# Patient Record
Sex: Male | Born: 1945 | Race: White | Hispanic: No | Marital: Married | Smoking: Never smoker
Health system: Southern US, Community
[De-identification: ages and names within clinical notes are randomized; demographics above are authoritative.]

## PROBLEM LIST (undated history)

## (undated) DIAGNOSIS — K5792 Diverticulitis of intestine, part unspecified, without perforation or abscess without bleeding: Secondary | ICD-10-CM

## (undated) DIAGNOSIS — I10 Essential (primary) hypertension: Secondary | ICD-10-CM

## (undated) DIAGNOSIS — M109 Gout, unspecified: Secondary | ICD-10-CM

## (undated) DIAGNOSIS — E785 Hyperlipidemia, unspecified: Secondary | ICD-10-CM

## (undated) DIAGNOSIS — G629 Polyneuropathy, unspecified: Secondary | ICD-10-CM

## (undated) DIAGNOSIS — Z77098 Contact with and (suspected) exposure to other hazardous, chiefly nonmedicinal, chemicals: Secondary | ICD-10-CM

## (undated) DIAGNOSIS — N3281 Overactive bladder: Secondary | ICD-10-CM

## (undated) DIAGNOSIS — G43909 Migraine, unspecified, not intractable, without status migrainosus: Secondary | ICD-10-CM

## (undated) DIAGNOSIS — K219 Gastro-esophageal reflux disease without esophagitis: Secondary | ICD-10-CM

## (undated) DIAGNOSIS — R7303 Prediabetes: Secondary | ICD-10-CM

## (undated) DIAGNOSIS — F419 Anxiety disorder, unspecified: Secondary | ICD-10-CM

## (undated) DIAGNOSIS — H269 Unspecified cataract: Secondary | ICD-10-CM

## (undated) DIAGNOSIS — G5600 Carpal tunnel syndrome, unspecified upper limb: Secondary | ICD-10-CM

## (undated) HISTORY — DX: Prediabetes: R73.03

## (undated) HISTORY — PX: MELANOMA EXCISION: SHX5266

## (undated) HISTORY — PX: OTHER SURGICAL HISTORY: SHX169

## (undated) HISTORY — DX: Essential (primary) hypertension: I10

## (undated) HISTORY — DX: Overactive bladder: N32.81

## (undated) HISTORY — DX: Gout, unspecified: M10.9

## (undated) HISTORY — DX: Carpal tunnel syndrome, unspecified upper limb: G56.00

## (undated) HISTORY — DX: Diverticulitis of intestine, part unspecified, without perforation or abscess without bleeding: K57.92

## (undated) HISTORY — DX: Unspecified cataract: H26.9

## (undated) HISTORY — DX: Migraine, unspecified, not intractable, without status migrainosus: G43.909

## (undated) HISTORY — DX: Anxiety disorder, unspecified: F41.9

## (undated) HISTORY — DX: Hyperlipidemia, unspecified: E78.5

## (undated) HISTORY — DX: Contact with and (suspected) exposure to other hazardous, chiefly nonmedicinal, chemicals: Z77.098

## (undated) HISTORY — DX: Gastro-esophageal reflux disease without esophagitis: K21.9

---

## 2001-07-10 ENCOUNTER — Other Ambulatory Visit: Admission: RE | Admit: 2001-07-10 | Discharge: 2001-07-10 | Payer: Self-pay | Admitting: Internal Medicine

## 2013-04-09 ENCOUNTER — Ambulatory Visit (INDEPENDENT_AMBULATORY_CARE_PROVIDER_SITE_OTHER): Payer: Medicare Other | Admitting: Family Medicine

## 2013-04-09 ENCOUNTER — Encounter: Payer: Self-pay | Admitting: Family Medicine

## 2013-04-09 VITALS — BP 146/78 | HR 60 | Temp 97.5°F | Ht 68.0 in | Wt 183.0 lb

## 2013-04-09 DIAGNOSIS — R739 Hyperglycemia, unspecified: Secondary | ICD-10-CM

## 2013-04-09 DIAGNOSIS — F411 Generalized anxiety disorder: Secondary | ICD-10-CM

## 2013-04-09 DIAGNOSIS — R7303 Prediabetes: Secondary | ICD-10-CM | POA: Insufficient documentation

## 2013-04-09 DIAGNOSIS — E785 Hyperlipidemia, unspecified: Secondary | ICD-10-CM | POA: Insufficient documentation

## 2013-04-09 DIAGNOSIS — M109 Gout, unspecified: Secondary | ICD-10-CM | POA: Insufficient documentation

## 2013-04-09 DIAGNOSIS — N529 Male erectile dysfunction, unspecified: Secondary | ICD-10-CM

## 2013-04-09 DIAGNOSIS — N318 Other neuromuscular dysfunction of bladder: Secondary | ICD-10-CM

## 2013-04-09 DIAGNOSIS — N3281 Overactive bladder: Secondary | ICD-10-CM

## 2013-04-09 DIAGNOSIS — R7309 Other abnormal glucose: Secondary | ICD-10-CM

## 2013-04-09 DIAGNOSIS — I10 Essential (primary) hypertension: Secondary | ICD-10-CM | POA: Insufficient documentation

## 2013-04-09 LAB — COMPLETE METABOLIC PANEL WITH GFR
ALT: 21 U/L (ref 0–53)
AST: 20 U/L (ref 0–37)
Albumin: 5 g/dL (ref 3.5–5.2)
Alkaline Phosphatase: 109 U/L (ref 39–117)
BUN: 16 mg/dL (ref 6–23)
CO2: 28 mEq/L (ref 19–32)
Calcium: 9.6 mg/dL (ref 8.4–10.5)
Chloride: 100 mEq/L (ref 96–112)
Creat: 1.21 mg/dL (ref 0.50–1.35)
GFR, Est African American: 72 mL/min
GFR, Est Non African American: 62 mL/min
Glucose, Bld: 115 mg/dL — ABNORMAL HIGH (ref 70–99)
Potassium: 4.4 mEq/L (ref 3.5–5.3)
Sodium: 139 mEq/L (ref 135–145)
Total Bilirubin: 1.2 mg/dL (ref 0.3–1.2)
Total Protein: 7.2 g/dL (ref 6.0–8.3)

## 2013-04-09 LAB — POCT GLYCOSYLATED HEMOGLOBIN (HGB A1C): Hemoglobin A1C: 5.7

## 2013-04-09 LAB — URIC ACID: Uric Acid, Serum: 6.7 mg/dL — ABNORMAL HIGH (ref 4.0–6.0)

## 2013-04-09 LAB — TSH: TSH: 1.861 u[IU]/mL (ref 0.350–4.500)

## 2013-04-09 MED ORDER — SILDENAFIL CITRATE 100 MG PO TABS: 100.0000 mg | ORAL_TABLET | Freq: Every day | ORAL | Status: DC | PRN

## 2013-04-09 MED ORDER — ALLOPURINOL 300 MG PO TABS: 300.0000 mg | ORAL_TABLET | Freq: Every day | ORAL | Status: DC

## 2013-04-09 MED ORDER — ATENOLOL 100 MG PO TABS: 100.0000 mg | ORAL_TABLET | Freq: Every day | ORAL | Status: DC

## 2013-04-09 MED ORDER — CITALOPRAM HYDROBROMIDE 20 MG PO TABS: 20.0000 mg | ORAL_TABLET | Freq: Every day | ORAL | Status: DC

## 2013-04-09 MED ORDER — SOLIFENACIN SUCCINATE 10 MG PO TABS: 10.0000 mg | ORAL_TABLET | Freq: Every day | ORAL | Status: DC

## 2013-04-09 MED ORDER — CLONAZEPAM 1 MG PO TABS: 1.0000 mg | ORAL_TABLET | Freq: Two times a day (BID) | ORAL | Status: DC | PRN

## 2013-04-09 NOTE — Progress Notes (Signed)
Patient ID: Jonathan Love, male   DOB: [birth year], 67 y.o.   MRN: [id number] SUBJECTIVE: CC: Chief Complaint  Patient presents with  . Follow-up    f/u chronic problems  fasting  ?cpe  . Medication Refill    refill all meds except atorvastain    HPI: Patient is here for follow up of hyperlipidemia/hypertension/hyperglycemia / anxiety disorder.: Doing very well. Stable BPs patient brought in are in normal ranges.systolic 130 or less and diastolic  80 or less. denies Headache;denies Chest Pain;denies weakness;denies Shortness of Breath and orthopnea;denies Visual changes;denies palpitations;denies cough;denies pedal edema;denies symptoms of TIA or stroke;deniesClaudication symptoms. admits to Compliance with medications; denies Problems with medications.  One episode of gout resolved now.   PMH/PSH: reviewed/updated in Epic  SH/FH: reviewed/updated in Epic  Allergies: reviewed/updated in Epic  Medications: reviewed/updated in Epic  Immunizations: reviewed/updated in Epic  ROS: As above in the HPI. All other systems are stable or negative.  OBJECTIVE: APPEARANCE:  Patient in no acute distress.The patient appeared well nourished and normally developed. Acyanotic. Waist: VITAL SIGNS:BP 146/78  Pulse 60  Temp(Src) 97.5 F (36.4 C) (Oral)  Ht 5\' 8"  (1.727 m)  Wt 183 lb (83.008 kg)  BMI 27.83 kg/m2 WM a bit hyperactive in the room. Has gardening to go do. NAD  SKIN: warm and  Dry without overt rashes, tattoos and scars  HEAD and Neck: without JVD, Head and scalp: normal Eyes:No scleral icterus. Fundi normal, eye movements normal. Ears: Auricle normal, canal normal, Tympanic membranes normal, insufflation normal. Nose: normal Throat: normal Neck & thyroid: normal  CHEST & LUNGS: Chest wall: normal Lungs: Clear  CVS: Reveals the PMI to be normally located. Regular rhythm, First and Second Heart sounds are normal,  absence of murmurs, rubs or gallops. Peripheral  vasculature: Radial pulses: normal Dorsal pedis pulses: normal Posterior pulses: normal  ABDOMEN:  Appearance: normal Benign, no organomegaly, no masses, no Abdominal Aortic enlargement. No Guarding , no rebound. No Bruits. Bowel sounds: normal  RECTAL: N/A GU: N/A  EXTREMETIES: nonedematous. Both Femoral and Pedal pulses are normal.  MUSCULOSKELETAL:  Spine: normal Joints: intact  NEUROLOGIC: oriented to time,place and person; nonfocal. Strength is normal Sensory is normal Reflexes are normal Cranial Nerves are normal.  ASSESSMENT: Gout - Plan: allopurinol (ZYLOPRIM) 300 MG tablet, Uric acid  HTN (hypertension) - Plan: atenolol (TENORMIN) 100 MG tablet, COMPLETE METABOLIC PANEL WITH GFR  HLD (hyperlipidemia) - Plan: COMPLETE METABOLIC PANEL WITH GFR, NMR Lipoprofile with Lipids  Prediabetes  Anxiety state, unspecified - Plan: clonazePAM (KLONOPIN) 1 MG tablet, citalopram (CELEXA) 20 MG tablet  OAB (overactive bladder) - Plan: solifenacin (VESICARE) 10 MG tablet  Erectile dysfunction - Plan: sildenafil (VIAGRA) 100 MG tablet, TSH  Hyperglycemia - Plan: POCT glycosylated hemoglobin (Hb A1C), COMPLETE METABOLIC PANEL WITH GFR   PLAN: Orders Placed This Encounter  Procedures  . COMPLETE METABOLIC PANEL WITH GFR  . NMR Lipoprofile with Lipids  . Uric acid  . TSH  . POCT glycosylated hemoglobin (Hb A1C)   Meds ordered this encounter  Medications  . DISCONTD: allopurinol (ZYLOPRIM) 300 MG tablet    Sig: Take 300 mg by mouth daily.   Marland Kitchen DISCONTD: atenolol (TENORMIN) 100 MG tablet    Sig: Take 100 mg by mouth daily.   Marland Kitchen atorvastatin (LIPITOR) 40 MG tablet    Sig: Take 40 mg by mouth daily.   Marland Kitchen DISCONTD: clonazePAM (KLONOPIN) 1 MG tablet    Sig: 1 mg 2 (two) times daily as needed.   Marland Kitchen  DISCONTD: VESICARE 10 MG tablet    Sig: Take 10 mg by mouth daily.   Marland Kitchen DISCONTD: sildenafil (VIAGRA) 100 MG tablet    Sig: Take 100 mg by mouth daily as needed for erectile  dysfunction.  . solifenacin (VESICARE) 10 MG tablet    Sig: Take 1 tablet (10 mg total) by mouth daily.    Dispense:  30 tablet    Refill:  11  . clonazePAM (KLONOPIN) 1 MG tablet    Sig: Take 1 tablet (1 mg total) by mouth 2 (two) times daily as needed.    Dispense:  60 tablet    Refill:  1  . citalopram (CELEXA) 20 MG tablet    Sig: Take 1 tablet (20 mg total) by mouth daily.    Dispense:  30 tablet    Refill:  5  . allopurinol (ZYLOPRIM) 300 MG tablet    Sig: Take 1 tablet (300 mg total) by mouth daily.    Dispense:  30 tablet    Refill:  11  . atenolol (TENORMIN) 100 MG tablet    Sig: Take 1 tablet (100 mg total) by mouth daily.    Dispense:  30 tablet    Refill:  5  . sildenafil (VIAGRA) 100 MG tablet    Sig: Take 1 tablet (100 mg total) by mouth daily as needed for erectile dysfunction.    Dispense:  10 tablet    Refill:  11        Dr Woodroe Mode Recommendations  Diet and Exercise discussed with patient.  For nutrition information, I recommend books:  1).Eat to Live by Dr Monico Hoar. 2).Prevent and Reverse Heart Disease by Dr Suzzette Righter. 3) Dr Katherina Right Book:  Program to Reverse Diabetes  Exercise recommendations are:  If unable to walk, then the patient can exercise in a chair 3 times a day. By flapping arms like a bird gently and raising legs outwards to the front.  If ambulatory, the patient can go for walks for 30 minutes 3 times a week. Then increase the intensity and duration as tolerated.  Goal is to try to attain exercise frequency to 5 times a week.  If applicable: Best to perform resistance exercises (machines or weights) 2 days a week and cardio type exercises 3 days per week. Handout onDASH Diet in the AVS.  Return in about 4 months (around 08/09/2013) for Physical, .   Carlise Stofer P. [place] Charon, M.D.

## 2013-04-09 NOTE — Patient Instructions (Signed)
    Dr Irisha Grandmaison's Recommendations  Diet and Exercise discussed with patient.  For nutrition information, I recommend books:  1).Eat to Live by Dr Joel Fuhrman. 2).Prevent and Reverse Heart Disease by Dr Caldwell Esselstyn. 3) Dr Neal Barnard's Book:  Program to Reverse Diabetes  Exercise recommendations are:  If unable to walk, then the patient can exercise in a chair 3 times a day. By flapping arms like a bird gently and raising legs outwards to the front.  If ambulatory, the patient can go for walks for 30 minutes 3 times a week. Then increase the intensity and duration as tolerated.  Goal is to try to attain exercise frequency to 5 times a week.  If applicable: Best to perform resistance exercises (machines or weights) 2 days a week and cardio type exercises 3 days per week.    DASH Diet The DASH diet stands for "Dietary Approaches to Stop Hypertension." It is a healthy eating plan that has been shown to reduce high blood pressure (hypertension) in as little as 14 days, while also possibly providing other significant health benefits. These other health benefits include reducing the risk of breast cancer after menopause and reducing the risk of type 2 diabetes, heart disease, colon cancer, and stroke. Health benefits also include weight loss and slowing kidney failure in patients with chronic kidney disease.  DIET GUIDELINES  Limit salt (sodium). Your diet should contain less than 1500 mg of sodium daily.  Limit refined or processed carbohydrates. Your diet should include mostly whole grains. Desserts and added sugars should be used sparingly.  Include small amounts of heart-healthy fats. These types of fats include nuts, oils, and tub margarine. Limit saturated and trans fats. These fats have been shown to be harmful in the body. CHOOSING FOODS  The following food groups are based on a 2000 calorie diet. See your Registered Dietitian for individual calorie needs. Grains and  Grain Products (6 to 8 servings daily)  Eat More Often: Whole-wheat bread, brown rice, whole-grain or wheat pasta, quinoa, popcorn without added fat or salt (air popped).  Eat Less Often: White bread, white pasta, white rice, cornbread. Vegetables (4 to 5 servings daily)  Eat More Often: Fresh, frozen, and canned vegetables. Vegetables may be raw, steamed, roasted, or grilled with a minimal amount of fat.  Eat Less Often/Avoid: Creamed or fried vegetables. Vegetables in a cheese sauce. Fruit (4 to 5 servings daily)  Eat More Often: All fresh, canned (in natural juice), or frozen fruits. Dried fruits without added sugar. One hundred percent fruit juice ( cup [237 mL] daily).  Eat Less Often: Dried fruits with added sugar. Canned fruit in light or heavy syrup. Lean Meats, Fish, and Poultry (2 servings or less daily. One serving is 3 to 4 oz [85-114 g]).  Eat More Often: Ninety percent or leaner ground beef, tenderloin, sirloin. Round cuts of beef, chicken breast, turkey breast. All fish. Grill, bake, or broil your meat. Nothing should be fried.  Eat Less Often/Avoid: Fatty cuts of meat, turkey, or chicken leg, thigh, or wing. Fried cuts of meat or fish. Dairy (2 to 3 servings)  Eat More Often: Low-fat or fat-free milk, low-fat plain or light yogurt, reduced-fat or part-skim cheese.  Eat Less Often/Avoid: Milk (whole, 2%).Whole milk yogurt. Full-fat cheeses. Nuts, Seeds, and Legumes (4 to 5 servings per week)  Eat More Often: All without added salt.  Eat Less Often/Avoid: Salted nuts and seeds, canned beans with added salt. Fats and Sweets (  limited)  Eat More Often: Vegetable oils, tub margarines without trans fats, sugar-free gelatin. Mayonnaise and salad dressings.  Eat Less Often/Avoid: Coconut oils, palm oils, butter, stick margarine, cream, half and half, cookies, candy, pie. FOR MORE INFORMATION The Dash Diet Eating Plan: www.dashdiet.org Document Released: 10/05/2011  Document Revised: 01/08/2012 Document Reviewed: 10/05/2011 [employer] Patient Information 2014 [employer].  

## 2013-04-10 LAB — NMR LIPOPROFILE WITH LIPIDS
Cholesterol, Total: 138 mg/dL (ref <200)
HDL Particle Number: 35.1 umol/L (ref >=30.5)
HDL Size: 8.7 nm — ABNORMAL LOW (ref >=9.2)
HDL-C: 47 mg/dL (ref >=40)
LDL (calc): 68 mg/dL (ref <100)
LDL Particle Number: 847 nmol/L (ref <1000)
LDL Size: 20.6 nm (ref >20.5)
LP-IR Score: 55 — ABNORMAL HIGH (ref <=45)
Large HDL-P: 3.6 umol/L — ABNORMAL LOW (ref >=4.8)
Large VLDL-P: 1.8 nmol/L (ref <=2.7)
Small LDL Particle Number: 395 nmol/L (ref <=527)
Triglycerides: 117 mg/dL (ref <150)
VLDL Size: 43.6 nm (ref <=46.6)

## 2013-04-11 NOTE — Progress Notes (Signed)
Quick Note:  Lab result at goal. prediabetes No change in Medications for now. No Change in plans and follow up. ______

## 2013-06-04 ENCOUNTER — Other Ambulatory Visit: Payer: Self-pay

## 2013-06-12 ENCOUNTER — Other Ambulatory Visit: Payer: Self-pay | Admitting: Family Medicine

## 2013-06-16 NOTE — Telephone Encounter (Signed)
fpw please advise

## 2013-06-16 NOTE — Telephone Encounter (Signed)
Okay to refill x1 

## 2013-06-16 NOTE — Telephone Encounter (Signed)
Last seen 04/09/13   [facility]   If approved call in and route to nurse

## 2013-06-17 NOTE — Telephone Encounter (Signed)
Called in.

## 2013-06-17 NOTE — Telephone Encounter (Signed)
Called in by [employer]

## 2013-07-14 ENCOUNTER — Encounter: Payer: Self-pay | Admitting: *Deleted

## 2013-07-14 ENCOUNTER — Other Ambulatory Visit: Payer: Self-pay | Admitting: Family Medicine

## 2013-07-15 NOTE — Telephone Encounter (Signed)
Last seen 04/09/13, last filled 06/12/13. If approved route to pool A so it can be called into [facility]

## 2013-07-17 NOTE — Telephone Encounter (Signed)
RX FOR CLONAZPEPAM CALLED TO [facility] VOICE MAIL

## 2013-07-17 NOTE — Telephone Encounter (Signed)
Rx ready for Phone in. 

## 2013-08-12 ENCOUNTER — Ambulatory Visit (INDEPENDENT_AMBULATORY_CARE_PROVIDER_SITE_OTHER): Payer: Medicare Other | Admitting: Family Medicine

## 2013-08-12 ENCOUNTER — Encounter: Payer: Self-pay | Admitting: Family Medicine

## 2013-08-12 VITALS — BP 135/78 | HR 77 | Temp 97.8°F | Ht 68.0 in | Wt 185.2 lb

## 2013-08-12 DIAGNOSIS — F411 Generalized anxiety disorder: Secondary | ICD-10-CM

## 2013-08-12 DIAGNOSIS — H6092 Unspecified otitis externa, left ear: Secondary | ICD-10-CM

## 2013-08-12 DIAGNOSIS — Z0000 Encounter for general adult medical examination without abnormal findings: Secondary | ICD-10-CM

## 2013-08-12 DIAGNOSIS — I10 Essential (primary) hypertension: Secondary | ICD-10-CM

## 2013-08-12 DIAGNOSIS — M109 Gout, unspecified: Secondary | ICD-10-CM

## 2013-08-12 DIAGNOSIS — N318 Other neuromuscular dysfunction of bladder: Secondary | ICD-10-CM

## 2013-08-12 DIAGNOSIS — Z7739 Contact with and (suspected) exposure to other war theater: Secondary | ICD-10-CM

## 2013-08-12 DIAGNOSIS — R7303 Prediabetes: Secondary | ICD-10-CM

## 2013-08-12 DIAGNOSIS — R7309 Other abnormal glucose: Secondary | ICD-10-CM

## 2013-08-12 DIAGNOSIS — N3281 Overactive bladder: Secondary | ICD-10-CM | POA: Insufficient documentation

## 2013-08-12 DIAGNOSIS — G5600 Carpal tunnel syndrome, unspecified upper limb: Secondary | ICD-10-CM

## 2013-08-12 DIAGNOSIS — E785 Hyperlipidemia, unspecified: Secondary | ICD-10-CM

## 2013-08-12 DIAGNOSIS — Z119 Encounter for screening for infectious and parasitic diseases, unspecified: Secondary | ICD-10-CM

## 2013-08-12 DIAGNOSIS — G43909 Migraine, unspecified, not intractable, without status migrainosus: Secondary | ICD-10-CM | POA: Insufficient documentation

## 2013-08-12 DIAGNOSIS — Z77098 Contact with and (suspected) exposure to other hazardous, chiefly nonmedicinal, chemicals: Secondary | ICD-10-CM | POA: Insufficient documentation

## 2013-08-12 DIAGNOSIS — G5602 Carpal tunnel syndrome, left upper limb: Secondary | ICD-10-CM

## 2013-08-12 DIAGNOSIS — H6090 Unspecified otitis externa, unspecified ear: Secondary | ICD-10-CM | POA: Insufficient documentation

## 2013-08-12 DIAGNOSIS — Z23 Encounter for immunization: Secondary | ICD-10-CM

## 2013-08-12 DIAGNOSIS — H60399 Other infective otitis externa, unspecified ear: Secondary | ICD-10-CM

## 2013-08-12 DIAGNOSIS — K219 Gastro-esophageal reflux disease without esophagitis: Secondary | ICD-10-CM

## 2013-08-12 DIAGNOSIS — F419 Anxiety disorder, unspecified: Secondary | ICD-10-CM | POA: Insufficient documentation

## 2013-08-12 MED ORDER — ATENOLOL 100 MG PO TABS: 100.0000 mg | ORAL_TABLET | Freq: Every day | ORAL | Status: DC

## 2013-08-12 MED ORDER — NEOMYCIN-POLYMYXIN-HC 1 % OT SOLN: 3.0000 [drp] | Freq: Four times a day (QID) | OTIC | Status: DC

## 2013-08-12 MED ORDER — ATORVASTATIN CALCIUM 40 MG PO TABS: 40.0000 mg | ORAL_TABLET | Freq: Every day | ORAL | Status: DC

## 2013-08-12 MED ORDER — CLONAZEPAM 1 MG PO TABS: ORAL_TABLET | ORAL | Status: DC

## 2013-08-12 MED ORDER — BENAZEPRIL HCL 10 MG PO TABS: 10.0000 mg | ORAL_TABLET | Freq: Every day | ORAL | Status: DC

## 2013-08-12 MED ORDER — SERTRALINE HCL 100 MG PO TABS: 100.0000 mg | ORAL_TABLET | Freq: Every day | ORAL | Status: DC

## 2013-08-12 MED ORDER — OXYBUTYNIN CHLORIDE 5 MG PO TABS: 5.0000 mg | ORAL_TABLET | Freq: Two times a day (BID) | ORAL | Status: DC

## 2013-08-12 NOTE — Progress Notes (Signed)
Patient ID: Jonathan Love, male   DOB: 1945-12-01, 67 y.o.   MRN: [id number] SUBJECTIVE: CC: Chief Complaint  Patient presents with  . Annual Exam    needs refills     HPI: Here for his annual physical Other issues: Wants to switch from vesicare to oxybutynin Had side effects of citalopram. It was helping. He wants to try something for his anxiety.  Past Medical History  Diagnosis Date  . Hypertension   . GERD (gastroesophageal reflux disease)   . Prediabetes   . Hyperlipidemia   . Gout   . Migraine   . CTS (carpal tunnel syndrome)     left  . 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 OAB (overactive bladder)   . Anxiety    Past Surgical History  Procedure Laterality Date  . Vocal cord nodule    . Right lateral epicondylitis     History   Social History  . Marital Status: Married    Spouse Name: N/A    Number of Children: N/A  . Years of Education: N/A   Occupational History  . Not on file.   Social History Main Topics  . Smoking status: Never Smoker   . Smokeless tobacco: Not on file  . Alcohol Use: Not on file  . Drug Use: Not on file  . Sexual Activity: Not on file   Other Topics Concern  . Not on file   Social History Narrative  . No narrative on file   No family history on file. Current Outpatient Prescriptions on File Prior to Visit  Medication Sig Dispense Refill  . allopurinol (ZYLOPRIM) 300 MG tablet Take 1 tablet (300 mg total) by mouth daily.  30 tablet  11  . sildenafil (VIAGRA) 100 MG tablet Take 1 tablet (100 mg total) by mouth daily as needed for erectile dysfunction.  10 tablet  11   No current facility-administered medications on file prior to visit.   Allergies  Allergen Reactions  . Penicillins    Immunization History  Administered Date(s) Administered  . Influenza Split 07/14/2013  . Tdap 08/12/2013   Prior to Admission medications   Medication Sig Start Date End Date Taking? Authorizing Provider  allopurinol (ZYLOPRIM) 300 MG tablet  Take 1 tablet (300 mg total) by mouth daily. 04/09/13  Yes Ileana Ladd, MD  atenolol (TENORMIN) 100 MG tablet Take 1 tablet (100 mg total) by mouth daily. 04/09/13  Yes Ileana Ladd, MD  atorvastatin (LIPITOR) 40 MG tablet Take 40 mg by mouth daily.  03/18/13  Yes Historical Provider, MD  benazepril (LOTENSIN) 10 MG tablet  05/08/13  Yes Historical Provider, MD  clonazePAM (KLONOPIN) 1 MG tablet TAKE ONE TABLET BY MOUTH TWICE DAILY AS NEEDED 07/14/13  Yes Ileana Ladd, MD  sildenafil (VIAGRA) 100 MG tablet Take 1 tablet (100 mg total) by mouth daily as needed for erectile dysfunction. 04/09/13  Yes Ileana Ladd, MD  solifenacin (VESICARE) 10 MG tablet Take 1 tablet (10 mg total) by mouth daily. 04/09/13  Yes Ileana Ladd, MD     ROS: As above in the HPI. All other systems are stable or negative.  OBJECTIVE: APPEARANCE:  Patient in no acute distress.The patient appeared well nourished and normally developed. Acyanotic. Waist: VITAL SIGNS:BP 135/78  Pulse 77  Temp(Src) 97.8 F (36.6 C) (Oral)  Ht 5\' 8"  (1.727 m)  Wt 185 lb 3.2 oz (84.006 kg)  BMI 28.17 kg/m2  WM  SKIN: warm and  Dry without  overt rashes, tattoos and scars  HEAD and Neck: without JVD, Head and scalp: normal Eyes:No scleral icterus. Fundi normal, eye movements normal. Ears: Auricle normal, canal left has watery white wax, and the  External canal is red and inflamed. Tympanic membranes normal, insufflation normal. Nose: normal Throat: normal Neck & thyroid: normal  CHEST & LUNGS: Chest wall: normal Lungs: Clear  CVS: Reveals the PMI to be normally located. Regular rhythm, First and Second Heart sounds are normal,  absence of murmurs, rubs or gallops. Peripheral vasculature: Radial pulses: normal Dorsal pedis pulses: normal Posterior pulses: normal  ABDOMEN:  Appearance: normal Benign, no organomegaly, no masses, no Abdominal Aortic enlargement. No Guarding , no rebound. No Bruits. Bowel sounds:  normal  RECTAL: N/A GU: N/A  EXTREMETIES: nonedematous.  MUSCULOSKELETAL:  Spine: normal Joints: intact  NEUROLOGIC: oriented to time,place and person; nonfocal. Strength is normal Sensory is normal Reflexes are normal Cranial Nerves are normal.  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  CTS (carpal tunnel syndrome), left  Migraine  Gout - Plan: Uric acid  GERD (gastroesophageal reflux disease)  Prediabetes  HTN (hypertension) - Plan: CMP14+EGFR, benazepril (LOTENSIN) 10 MG tablet, atenolol (TENORMIN) 100 MG tablet  HLD (hyperlipidemia) - Plan: CMP14+EGFR, NMR, lipoprofile, atorvastatin (LIPITOR) 40 MG tablet  OE (otitis externa), left  OAB (overactive bladder) - Plan: oxybutynin (DITROPAN) 5 MG tablet  Anxiety - Plan: sertraline (ZOLOFT) 100 MG tablet, clonazePAM (KLONOPIN) 1 MG tablet  Screening examination for infectious disease - Plan: Hepatitis C antibody  PLAN:  Orders Placed This Encounter  Procedures  . Tdap vaccine greater than or equal to 7yo IM  . CMP14+EGFR  . NMR, lipoprofile  . Uric acid  . Hepatitis C antibody   Meds ordered this encounter  Medications  . DISCONTD: benazepril (LOTENSIN) 10 MG tablet    Sig:   . DISCONTD: FLUVIRIN PRESERVATIVE FREE SUSP injection    Sig:   . sertraline (ZOLOFT) 100 MG tablet    Sig: Take 1 tablet (100 mg total) by mouth daily. ( start with 1/2 tablet daily for 1 week.)    Dispense:  30 tablet    Refill:  5  . oxybutynin (DITROPAN) 5 MG tablet    Sig: Take 1 tablet (5 mg total) by mouth 2 (two) times daily.    Dispense:  60 tablet    Refill:  5  . clonazePAM (KLONOPIN) 1 MG tablet    Sig: TAKE ONE TABLET BY MOUTH TWICE DAILY AS NEEDED    Dispense:  60 tablet    Refill:  2  . atorvastatin (LIPITOR) 40 MG tablet    Sig: Take 1 tablet (40 mg total) by  mouth daily.    Dispense:  30 tablet    Refill:  5  . benazepril (LOTENSIN) 10 MG tablet    Sig: Take 1 tablet (10 mg total) by mouth daily.    Dispense:  30 tablet    Refill:  5  . atenolol (TENORMIN) 100 MG tablet    Sig: Take 1 tablet (100 mg total) by mouth daily.    Dispense:  30 tablet    Refill:  5   Medications Discontinued During This Encounter  Medication Reason  . citalopram (CELEXA) 20 MG tablet Patient has not taken in last 30 days  . FLUVIRIN PRESERVATIVE FREE SUSP injection Completed Course  .  solifenacin (VESICARE) 10 MG tablet Cost of medication  . clonazePAM (KLONOPIN) 1 MG tablet Reorder  . atorvastatin (LIPITOR) 40 MG tablet Reorder  . benazepril (LOTENSIN) 10 MG tablet Reorder  . atenolol (TENORMIN) 100 MG tablet Reorder         HEALTH MAINTENANCE Immunizations: Tetanus-Diphtheria Booster ZOX:[id number] Pertusis Booster [id number]:[id number] Flu Shot Due: every Fall Pneumonia Vaccine: usually at 66 years of age unless there are certain risk situations. Herpes Zoster/Shingles Vaccine due: usually at 66 years of age HPV JYN:WGNF age 42 to 20 years in males and females.  Healthy Life Habits: Exercise Goal: 5-6 days/week; start gradually(ie 30 minutes/3days per week) Nutrition: Balanced healthy meals including Vegetables and Fruits. Consider  Reading the following books: 1) Eat to Live by Dr Ottis Stain; 2) Prevent and Reverse Heart Disease by Dr Suzzette Righter.  Vitamins:Okay for a Multivitamin Aspirin:81 mg Stop Tobacco Use:n/a Seat Belt Use:+++ recommended Sunscreen Use:+++ recommended   Recommended Screening Tests: Colon Cancer Screening:past due Blood work:today Cholesterol Screening:  today        HIV:       ?           Hepatitis C(people born 1945-1965):today    Monthly Self Testicular Exam:+++  Eye Exam: every 1 to 2 years recommended Dental Health: at least every 6 months  Others:    Living Will/Healthcare Power of Attorney: should have this in  order with your personal estate planning  He will discuss with his wife who to be referred to for colonoscopy and get back with [place]. Return in about 3 months (around 11/12/2013) for Recheck medical problems.  Annica Marinello P. [place] Charon, M.D.

## 2013-08-12 NOTE — Patient Instructions (Addendum)
Tetanus, Diphtheria (Td) or Tetanus, Diphtheria, Pertussis (Tdap) Vaccine What You Need to Know WHY GET VACCINATED? Tetanus, diphtheria and pertussis can be very serious diseases. TETANUS (Lockjaw) causes painful muscle spasms and stiffness, usually all over the body.  Tetanus can lead to tightening of muscles in the head and neck so the victim cannot open his mouth or swallow, or sometimes even breathe. Tetanus kills about 1 out of 5 people who are infected. DIPHTHERIA can cause a thick membrane to cover the back of the throat.  Diphtheria can lead to breathing problems, paralysis, heart failure, and even death. PERTUSSIS (Whooping Cough) causes severe coughing spells which can lead to difficulty breathing, vomiting, and disturbed sleep.  Pertussis can lead to weight loss, incontinence, rib fractures and passing out from violent coughing. Up to 2 in 100 adolescents and 5 in 100 adults with pertussis are hospitalized or have complications, including pneumonia and death. These 3 diseases are all caused by bacteria. Diphtheria and pertussis are spread from person to person. Tetanus enters the body through cuts, scratches, or wounds. The [place] saw as many as 200,000 cases a year of diphtheria and pertussis before vaccines were available, and hundreds of cases of tetanus. Since then, tetanus and diphtheria cases have dropped by about 99% and pertussis cases by about 92%. Children 6 years of age and younger get DTaP vaccine to protect them from these three diseases. But older children, adolescents, and adults need protection too. VACCINES FOR ADOLESCENTS AND ADULTS: TD AND TDAP Two vaccines are available to protect people 7 years of age and older from these diseases:  Td vaccine has been used for many years. It protects against tetanus and diphtheria.  Tdap vaccine was licensed in 2005. It is the first vaccine for adolescents and adults that protects against pertussis as well as tetanus and  diphtheria. A Td booster dose is recommended every 10 years. Tdap is given only once. WHICH VACCINE, AND WHEN? Ages 7 through 18 years  A dose of Tdap is recommended at age 11 or 12. This dose could be given as early as age 7 for children who missed one or more childhood doses of DTaP.  Children and adolescents who did not get a complete series of DTaP shots by age 7 should complete the series using a combination of Td and Tdap. Age 19 years and Older  All adults should get a booster dose of Td every 10 years. Adults under 65 who have never gotten Tdap should get a dose of Tdap as their next booster dose. Adults 65 and older may get one booster dose of Tdap.  Adults (including women who may become pregnant and adults 65 and older) who expect to have close contact with a baby younger than 12 months of age should get a dose of Tdap to help protect the baby from pertussis.  Healthcare professionals who have direct patient contact in hospitals or clinics should get one dose of Tdap. Protection After a Wound  A person who gets a severe cut or burn might need a dose of Td or Tdap to prevent tetanus infection. Tdap should be used for anyone who has never had a dose previously. Td should be used if Tdap is not available, or for:  Anybody who has already had a dose of Tdap.  Children 7 through 9 years of age who completed the childhood DTaP series.  Adults 65 and older. Pregnant Women  Pregnant women who have never had a dose of Tdap   should get one, after the 20th week of gestation and preferably during the 3rd trimester. If they do not get Tdap during their pregnancy they should get a dose as soon as possible after delivery. Pregnant women who have previously received Tdap and need tetanus or diphtheria vaccine while pregnant should get Td. Tdap and Td may be given at the same time as other vaccines. SOME PEOPLE SHOULD NOT BE VACCINATED OR SHOULD WAIT  Anyone who has had a life-threatening  allergic reaction after a dose of any tetanus, diphtheria, or pertussis containing vaccine should not get Td or Tdap.  Anyone who has a severe allergy to any component of a vaccine should not get that vaccine. Tell your doctor if the person getting the vaccine has any severe allergies.  Anyone who had a coma, or long or multiple seizures within 7 days after a dose of DTP or DTaP should not get Tdap, unless a cause other than the vaccine was found. These people may get Td.  Talk to your doctor if the person getting either vaccine:  Has epilepsy or another nervous system problem.  Had severe swelling or severe pain after a previous dose of DTP, DTaP, DT, Td, or Tdap vaccine.  Has had Guillain Barr Syndrome (GBS). Anyone who has a moderate or severe illness on the day the shot is scheduled should usually wait until they recover before getting Tdap or Td vaccine. A person with a mild illness or low fever can usually be vaccinated. WHAT ARE THE RISKS FROM TDAP AND TD VACCINES? With a vaccine, as with any medicine, there is always a small risk of a life-threatening allergic reaction or other serious problem. Brief fainting spells and related symptoms (such as jerking movements) can happen after any medical procedure, including vaccination. Sitting or lying down for about 15 minutes after a vaccination can help prevent fainting and injuries caused by falls. Tell your doctor if the patient feels dizzy or lightheaded, or has vision changes or ringing in the ears. Getting tetanus, diphtheria, or pertussis would be much more likely to lead to severe problems than getting either Td or Tdap vaccine. Problems reported after Td and Tdap vaccines are listed below. Mild Problems (noticeable, but did not interfere with activities) Tdap  Pain (about 3 in 4 adolescents and 2 in 3 adults).  Redness or swelling (about 1 in 5).  Mild fever of at least 100.4 F (38 C) (up to about 1 in 25 adolescents and 1 in  100 adults).  Headache (about 4 in 10 adolescents and 3 in 10 adults).  Tiredness (about 1 in 3 adolescents and 1 in 4 adults).  Nausea, vomiting, diarrhea, or stomach ache (up to 1 in 4 adolescents and 1 in 10 adults).  Chills, body aches, sore joints, rash, or swollen glands (uncommon). Td  Pain (up to about 8 in 10).  Redness or swelling at the injection site (up to about 1 in 3).  Mild fever (up to about 1 in 15).  Headache or tiredness (uncommon). Moderate Problems (interfered with activities, but did not require medical attention) Tdap  Pain at the injection site (about 1 in 20 adolescents and 1 in 100 adults).  Redness or swelling at the injection site (up to about 1 in 16 adolescents and 1 in 25 adults).  Fever over 102 F (38.9 C) (about 1 in 100 adolescents and 1 in 250 adults).  Headache (1 in 300).  Nausea, vomiting, diarrhea, or stomach ache (up to 3   in 100 adolescents and 1 in 100 adults). Td  Fever over 102 F (38.9 C) (rare). Tdap or Td  Extensive swelling of the arm where the shot was given (up to about 3 in 100). Severe Problems (Unable to perform usual activities; required medical attention) Tdap or Td  Swelling, severe pain, bleeding, and redness in the arm where the shot was given (rare). A severe allergic reaction could occur after any vaccine. They are estimated to occur less than once in a million doses. WHAT IF THERE IS A SEVERE REACTION? What should I look for? Any unusual condition, such as a severe allergic reaction or a high fever. If a severe allergic reaction occurred, it would be within a few minutes to an hour after the shot. Signs of a serious allergic reaction can include difficulty breathing, weakness, hoarseness or wheezing, a fast heartbeat, hives, dizziness, paleness, or swelling of the throat. What should I do?  Call a doctor, or get the person to a doctor right away.  Tell your doctor what happened, the date and time it  happened, and when the vaccination was given.  Ask your provider to report the reaction by filing a Vaccine Adverse Event Reporting System (VAERS) form. Or, you can file this report through the VAERS website at www.vaers.LAgents.no or by calling 1-(563)019-0606. VAERS does not provide medical advice. THE NATIONAL VACCINE INJURY COMPENSATION PROGRAM The National Vaccine Injury Compensation Program (VICP) was created in 1986. Persons who believe they may have been injured by a vaccine can learn about the program and about filing a claim by calling 1-(386)719-7196 or visiting the VICP website at SpiritualWord.at. HOW CAN I LEARN MORE?  Your doctor can give you the vaccine package insert or suggest other sources of information.  Call your local or state health department.  Contact the Centers for Disease Control and Prevention (CDC):  Call 715-705-1427 (1-800-CDC-INFO).  Visit the [facility] website at PicCapture.uy. CDC Td and Tdap Interim VIS (11/22/10) Document Released: 08/13/2006 Document Revised: 01/08/2012 Document Reviewed: 11/22/2010 ExitCare Patient Information 2014 [place], [place].        HEALTH MAINTENANCE Immunizations: Tetanus-Diphtheria Booster JWJ:XBJYN Pertusis Booster WGN:FAOZH Flu Shot Due: every Fall Pneumonia Vaccine: usually at 66 years of age unless there are certain risk situations. Herpes Zoster/Shingles Vaccine due: usually at 66 years of age HPV YQM:VHQI age 41 to 17 years in males and females.  Healthy Life Habits: Exercise Goal: 5-6 days/week; start gradually(ie 30 minutes/3days per week) Nutrition: Balanced healthy meals including Vegetables and Fruits. Consider  Reading the following books: 1) Eat to Live by Dr Ottis Stain; 2) Prevent and Reverse Heart Disease by Dr Suzzette Righter.  Vitamins:Multivitamin Okay Aspirin:81 mg Stop Tobacco Use:n/a Seat Belt Use:+++ recommended Sunscreen Use:+++ recommended   Recommended Screening  Tests: Colon Cancer Screening:past due Blood work:today Cholesterol Screening:  today        HIV:       ?           Hepatitis C(people born 1945-1965):today    Monthly Self Testicular Exam:+++  Eye Exam: every 1 to 2 years recommended Dental Health: at least every 6 months  Others:    Living Will/Healthcare Power of Attorney: should have this in order with your personal estate planning

## 2013-08-12 NOTE — Progress Notes (Signed)
Tolerated tdap without difficulty 

## 2013-08-13 ENCOUNTER — Encounter: Payer: Medicare Other | Admitting: Family Medicine

## 2013-08-13 LAB — CMP14+EGFR
ALT: 23 IU/L (ref 0–44)
AST: 19 IU/L (ref 0–40)
Albumin/Globulin Ratio: 1.9 (ref 1.1–2.5)
Albumin: 4.8 g/dL (ref 3.6–4.8)
Alkaline Phosphatase: 121 IU/L — ABNORMAL HIGH (ref 39–117)
BUN/Creatinine Ratio: 14 (ref 10–22)
BUN: 16 mg/dL (ref 8–27)
CO2: 27 mmol/L (ref 18–29)
Calcium: 10 mg/dL (ref 8.6–10.2)
Chloride: 97 mmol/L (ref 97–108)
Creatinine, Ser: 1.12 mg/dL (ref 0.76–1.27)
GFR calc Af Amer: 79 mL/min/{1.73_m2} (ref >59)
GFR calc non Af Amer: 68 mL/min/{1.73_m2} (ref >59)
Globulin, Total: 2.5 g/dL (ref 1.5–4.5)
Glucose: 96 mg/dL (ref 65–99)
Potassium: 4.7 mmol/L (ref 3.5–5.2)
Sodium: 140 mmol/L (ref 134–144)
Total Bilirubin: 0.8 mg/dL (ref 0.0–1.2)
Total Protein: 7.3 g/dL (ref 6.0–8.5)

## 2013-08-13 LAB — NMR, LIPOPROFILE
Cholesterol: 156 mg/dL (ref <200)
HDL Cholesterol by NMR: 47 mg/dL (ref >=40)
HDL Particle Number: 38.7 umol/L (ref >=30.5)
LDL Particle Number: 987 nmol/L (ref <1000)
LDL Size: 20 nm — ABNORMAL LOW (ref >20.5)
LDLC SERPL CALC-MCNC: 72 mg/dL (ref <100)
LP-IR Score: 58 — ABNORMAL HIGH (ref <=45)
Small LDL Particle Number: 833 nmol/L — ABNORMAL HIGH (ref <=527)
Triglycerides by NMR: 183 mg/dL — ABNORMAL HIGH (ref <150)

## 2013-08-13 LAB — HEPATITIS C ANTIBODY: Hep C Virus Ab: <0.1 s/co ratio (ref 0.0–0.9)

## 2013-08-13 LAB — URIC ACID: Uric Acid: 7.1 mg/dL (ref 3.7–8.6)

## 2013-11-14 ENCOUNTER — Ambulatory Visit (INDEPENDENT_AMBULATORY_CARE_PROVIDER_SITE_OTHER): Payer: Medicare Other | Admitting: Family Medicine

## 2013-11-14 ENCOUNTER — Encounter: Payer: Self-pay | Admitting: Family Medicine

## 2013-11-14 VITALS — BP 139/72 | HR 64 | Temp 97.9°F | Ht 68.0 in | Wt 184.4 lb

## 2013-11-14 DIAGNOSIS — F419 Anxiety disorder, unspecified: Secondary | ICD-10-CM

## 2013-11-14 DIAGNOSIS — M109 Gout, unspecified: Secondary | ICD-10-CM

## 2013-11-14 DIAGNOSIS — R7303 Prediabetes: Secondary | ICD-10-CM

## 2013-11-14 DIAGNOSIS — N3281 Overactive bladder: Secondary | ICD-10-CM

## 2013-11-14 DIAGNOSIS — K219 Gastro-esophageal reflux disease without esophagitis: Secondary | ICD-10-CM

## 2013-11-14 DIAGNOSIS — E785 Hyperlipidemia, unspecified: Secondary | ICD-10-CM

## 2013-11-14 DIAGNOSIS — R7309 Other abnormal glucose: Secondary | ICD-10-CM

## 2013-11-14 DIAGNOSIS — G43909 Migraine, unspecified, not intractable, without status migrainosus: Secondary | ICD-10-CM

## 2013-11-14 DIAGNOSIS — I10 Essential (primary) hypertension: Secondary | ICD-10-CM

## 2013-11-14 DIAGNOSIS — N318 Other neuromuscular dysfunction of bladder: Secondary | ICD-10-CM

## 2013-11-14 DIAGNOSIS — F411 Generalized anxiety disorder: Secondary | ICD-10-CM

## 2013-11-14 LAB — POCT GLYCOSYLATED HEMOGLOBIN (HGB A1C): Hemoglobin A1C: 5.5

## 2013-11-14 MED ORDER — ALLOPURINOL 300 MG PO TABS: 300.0000 mg | ORAL_TABLET | Freq: Every day | ORAL | Status: DC

## 2013-11-14 MED ORDER — OXYBUTYNIN CHLORIDE 5 MG PO TABS: 5.0000 mg | ORAL_TABLET | Freq: Two times a day (BID) | ORAL | Status: DC

## 2013-11-14 MED ORDER — CLONAZEPAM 1 MG PO TABS: ORAL_TABLET | ORAL | Status: DC

## 2013-11-14 MED ORDER — ATENOLOL 100 MG PO TABS: 100.0000 mg | ORAL_TABLET | Freq: Every day | ORAL | Status: DC

## 2013-11-14 MED ORDER — ATORVASTATIN CALCIUM 40 MG PO TABS: 40.0000 mg | ORAL_TABLET | Freq: Every day | ORAL | Status: DC

## 2013-11-14 MED ORDER — BENAZEPRIL HCL 10 MG PO TABS: 10.0000 mg | ORAL_TABLET | Freq: Every day | ORAL | Status: DC

## 2013-11-14 NOTE — Patient Instructions (Signed)
      Dr Emaan Gary's Recommendations  For nutrition information, I recommend books:  1).Eat to Live by Dr Joel Fuhrman. 2).Prevent and Reverse Heart Disease by Dr Caldwell Esselstyn. 3) Dr Neal Barnard's Book:  Program to Reverse Diabetes  Exercise recommendations are:  If unable to walk, then the patient can exercise in a chair 3 times a day. By flapping arms like a bird gently and raising legs outwards to the front.  If ambulatory, the patient can go for walks for 30 minutes 3 times a week. Then increase the intensity and duration as tolerated.  Goal is to try to attain exercise frequency to 5 times a week.  If applicable: Best to perform resistance exercises (machines or weights) 2 days a week and cardio type exercises 3 days per week.  

## 2013-11-14 NOTE — Progress Notes (Signed)
Patient ID: Jonathan Love, male   DOB: April 29, 1946, 68 y.o.   MRN: [id number] SUBJECTIVE: CC: Chief Complaint  Patient presents with  . Follow-up    3 mo nth follow up  c/o headache .pt states can not take zoloft  . Medication Refill    wants 90 day refills     HPI:  Woke up with a migraine lasted 4 hours. Gets a mild one every 3 months. And then 2 bad ones a year. Patient is here for follow up of hyperlipidemia/HTN/Pre-dm/OAB/anxiety/gout: denies Headache;denies Chest Pain;denies weakness;denies Shortness of Breath and orthopnea;denies Visual changes;denies palpitations;denies cough;denies pedal edema;denies symptoms of TIA or stroke;deniesClaudication symptoms. admits to Compliance with medications; denies Problems with medications.  Doesn't want to continue with any SSRIs. The zoloft messed up his  Stomach. Present regimen is the correct one.  Past Medical History  Diagnosis Date  . Hypertension   . GERD (gastroesophageal reflux disease)   . Prediabetes   . Hyperlipidemia   . Gout   . Migraine   . CTS (carpal tunnel syndrome)     left  . 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 OAB (overactive bladder)   . Anxiety    Past Surgical History  Procedure Laterality Date  . Vocal cord nodule    . Right lateral epicondylitis     History   Social History  . Marital Status: Married    Spouse Name: N/A    Number of Children: N/A  . Years of Education: N/A   Occupational History  . Not on file.   Social History Main Topics  . Smoking status: Never Smoker   . Smokeless tobacco: Not on file  . Alcohol Use: Not on file  . Drug Use: Not on file  . Sexual Activity: Not on file   Other Topics Concern  . Not on file   Social History Narrative  . No narrative on file   No family history on file. Current Outpatient Prescriptions on File Prior to Visit  Medication Sig Dispense Refill  . NEOMYCIN-POLYMYXIN-HYDROCORTISONE (CORTISPORIN) 1 % SOLN otic solution Place 3 drops into the  right ear 4 (four) times daily.  10 mL  0  . sildenafil (VIAGRA) 100 MG tablet Take 1 tablet (100 mg total) by mouth daily as needed for erectile dysfunction.  10 tablet  11   No current facility-administered medications on file prior to visit.   Allergies  Allergen Reactions  . Penicillins    Immunization History  Administered Date(s) Administered  . Influenza Split 07/14/2013  . Tdap 08/12/2013   Prior to Admission medications   Medication Sig Start Date End Date Taking? Authorizing Provider  allopurinol (ZYLOPRIM) 300 MG tablet Take 1 tablet (300 mg total) by mouth daily. 04/09/13   Vernie Shanks, MD  atenolol (TENORMIN) 100 MG tablet Take 1 tablet (100 mg total) by mouth daily. 08/12/13   Vernie Shanks, MD  atorvastatin (LIPITOR) 40 MG tablet Take 1 tablet (40 mg total) by mouth daily. 08/12/13   Vernie Shanks, MD  benazepril (LOTENSIN) 10 MG tablet Take 1 tablet (10 mg total) by mouth daily. 08/12/13   Vernie Shanks, MD  clonazePAM (KLONOPIN) 1 MG tablet TAKE ONE TABLET BY MOUTH TWICE DAILY AS NEEDED 08/12/13   Vernie Shanks, MD  NEOMYCIN-POLYMYXIN-HYDROCORTISONE (CORTISPORIN) 1 % SOLN otic solution Place 3 drops into the right ear 4 (four) times daily. 08/12/13   Vernie Shanks, MD  oxybutynin (DITROPAN) 5 MG tablet Take  1 tablet (5 mg total) by mouth 2 (two) times daily. 08/12/13   Vernie Shanks, MD  sertraline (ZOLOFT) 100 MG tablet Take 1 tablet (100 mg total) by mouth daily. ( start with 1/2 tablet daily for 1 week.) 08/12/13   Vernie Shanks, MD  sildenafil (VIAGRA) 100 MG tablet Take 1 tablet (100 mg total) by mouth daily as needed for erectile dysfunction. 04/09/13   Vernie Shanks, MD     ROS: As above in the HPI. All other systems are stable or negative.  OBJECTIVE: APPEARANCE:  Patient in no acute distress.The patient appeared well nourished and normally developed. Acyanotic. Waist: VITAL SIGNS:BP 139/72  Pulse 64  Temp(Src) 97.9 F (36.6 C) (Oral)  Ht 5'  8" (1.727 m)  Wt 184 lb 6.4 oz (83.643 kg)  BMI 28.04 kg/m2 WM   SKIN: warm and  Dry without overt rashes, tattoos and scars  HEAD and Neck: without JVD, Head and scalp: normal Eyes:No scleral icterus. Fundi normal, eye movements normal. Ears: Auricle normal, canal normal, Tympanic membranes normal, insufflation normal. Nose: normal Throat: normal Neck & thyroid: normal  CHEST & LUNGS: Chest wall: normal Lungs: Clear  CVS: Reveals the PMI to be normally located. Regular rhythm, First and Second Heart sounds are normal,  absence of murmurs, rubs or gallops. Peripheral vasculature: Radial pulses: normal Dorsal pedis pulses: normal Posterior pulses: normal  ABDOMEN:  Appearance: normal Benign, no organomegaly, no masses, no Abdominal Aortic enlargement. No Guarding , no rebound. No Bruits. Bowel sounds: normal  RECTAL: N/A GU: N/A  EXTREMETIES: nonedematous.  MUSCULOSKELETAL:  Spine: normal Joints: intact  NEUROLOGIC: oriented to time,place and person; nonfocal. Strength is normal Sensory is normal Reflexes are normal Cranial Nerves are normal. Results for orders placed in visit on 11/14/13  POCT GLYCOSYLATED HEMOGLOBIN (HGB A1C)      Result Value Range   Hemoglobin A1C 5.5      ASSESSMENT: Migraine  Resolved today  Migraine  HLD (hyperlipidemia) - Plan: NMR, lipoprofile, atorvastatin (LIPITOR) 40 MG tablet  HTN (hypertension) - Plan: CMP14+EGFR, atenolol (TENORMIN) 100 MG tablet, benazepril (LOTENSIN) 10 MG tablet  Prediabetes - Plan: POCT glycosylated hemoglobin (Hb A1C), CMP14+EGFR  Anxiety - Plan: clonazePAM (KLONOPIN) 1 MG tablet  GERD (gastroesophageal reflux disease)  Gout - Plan: Uric acid, allopurinol (ZYLOPRIM) 300 MG tablet  OAB (overactive bladder) - Plan: oxybutynin (DITROPAN) 5 MG tablet  Intolerant to SSRI  PLAN:      Dr Paula Libra Recommendations  For nutrition information, I recommend books:  1).Eat to Live by Dr Excell Seltzer. 2).Prevent and Reverse Heart Disease by Dr Karl Luke. 3) Dr Janene Harvey Book:  Program to Reverse Diabetes  Exercise recommendations are:  If unable to walk, then the patient can exercise in a chair 3 times a day. By flapping arms like a bird gently and raising legs outwards to the front.  If ambulatory, the patient can go for walks for 30 minutes 3 times a week. Then increase the intensity and duration as tolerated.  Goal is to try to attain exercise frequency to 5 times a week.  If applicable: Best to perform resistance exercises (machines or weights) 2 days a week and cardio type exercises 3 days per week.  Orders Placed This Encounter  Procedures  . CMP14+EGFR  . NMR, lipoprofile  . Uric acid  . POCT glycosylated hemoglobin (Hb A1C)   Meds ordered this encounter  Medications  . BOOSTRIX 5-2.5-18.5 injection    Sig:   .  clonazePAM (KLONOPIN) 1 MG tablet    Sig: TAKE ONE TABLET BY MOUTH TWICE DAILY AS NEEDED    Dispense:  60 tablet    Refill:  2  . allopurinol (ZYLOPRIM) 300 MG tablet    Sig: Take 1 tablet (300 mg total) by mouth daily.    Dispense:  30 tablet    Refill:  11  . atorvastatin (LIPITOR) 40 MG tablet    Sig: Take 1 tablet (40 mg total) by mouth daily.    Dispense:  30 tablet    Refill:  5  . atenolol (TENORMIN) 100 MG tablet    Sig: Take 1 tablet (100 mg total) by mouth daily.    Dispense:  30 tablet    Refill:  5  . benazepril (LOTENSIN) 10 MG tablet    Sig: Take 1 tablet (10 mg total) by mouth daily.    Dispense:  30 tablet    Refill:  5  . oxybutynin (DITROPAN) 5 MG tablet    Sig: Take 1 tablet (5 mg total) by mouth 2 (two) times daily.    Dispense:  60 tablet    Refill:  5   Medications Discontinued During This Encounter  Medication Reason  . sertraline (ZOLOFT) 100 MG tablet Side effect (s)  . clonazePAM (KLONOPIN) 1 MG tablet Reorder  . allopurinol (ZYLOPRIM) 300 MG tablet Reorder  . atorvastatin (LIPITOR) 40 MG tablet  Reorder  . atenolol (TENORMIN) 100 MG tablet Reorder  . benazepril (LOTENSIN) 10 MG tablet Reorder  . oxybutynin (DITROPAN) 5 MG tablet Reorder   Return in about 3 months (around 02/12/2014) for Recheck medical problems.  Kailer Heindel P. Jacelyn Grip, M.D.

## 2013-11-16 LAB — CMP14+EGFR
ALT: 16 IU/L (ref 0–44)
AST: 19 IU/L (ref 0–40)
Albumin/Globulin Ratio: 1.9 (ref 1.1–2.5)
Albumin: 4.6 g/dL (ref 3.6–4.8)
Alkaline Phosphatase: 125 IU/L — ABNORMAL HIGH (ref 39–117)
BUN/Creatinine Ratio: 13 (ref 10–22)
BUN: 16 mg/dL (ref 8–27)
CO2: 25 mmol/L (ref 18–29)
Calcium: 9.7 mg/dL (ref 8.6–10.2)
Chloride: 98 mmol/L (ref 97–108)
Creatinine, Ser: 1.23 mg/dL (ref 0.76–1.27)
GFR calc Af Amer: 70 mL/min/{1.73_m2} (ref >59)
GFR calc non Af Amer: 61 mL/min/{1.73_m2} (ref >59)
Globulin, Total: 2.4 g/dL (ref 1.5–4.5)
Glucose: 111 mg/dL — ABNORMAL HIGH (ref 65–99)
Potassium: 4.1 mmol/L (ref 3.5–5.2)
Sodium: 142 mmol/L (ref 134–144)
Total Bilirubin: 0.9 mg/dL (ref 0.0–1.2)
Total Protein: 7 g/dL (ref 6.0–8.5)

## 2013-11-16 LAB — NMR, LIPOPROFILE
Cholesterol: 136 mg/dL (ref <200)
HDL Cholesterol by NMR: 51 mg/dL (ref >=40)
HDL Particle Number: 34.3 umol/L (ref >=30.5)
LDL Particle Number: 768 nmol/L (ref <1000)
LDL Size: 20.5 nm — ABNORMAL LOW (ref >20.5)
LDLC SERPL CALC-MCNC: 56 mg/dL (ref <100)
LP-IR Score: 54 — ABNORMAL HIGH (ref <=45)
Small LDL Particle Number: 426 nmol/L (ref <=527)
Triglycerides by NMR: 146 mg/dL (ref <150)

## 2013-11-16 LAB — URIC ACID: Uric Acid: 5.4 mg/dL (ref 3.7–8.6)

## 2014-02-19 ENCOUNTER — Encounter: Payer: Self-pay | Admitting: Family Medicine

## 2014-02-19 ENCOUNTER — Ambulatory Visit (INDEPENDENT_AMBULATORY_CARE_PROVIDER_SITE_OTHER): Payer: Medicare Other

## 2014-02-19 ENCOUNTER — Ambulatory Visit (INDEPENDENT_AMBULATORY_CARE_PROVIDER_SITE_OTHER): Payer: Medicare Other | Admitting: Family Medicine

## 2014-02-19 VITALS — BP 136/77 | HR 66 | Temp 98.0°F | Ht 66.0 in | Wt 185.6 lb

## 2014-02-19 DIAGNOSIS — K219 Gastro-esophageal reflux disease without esophagitis: Secondary | ICD-10-CM

## 2014-02-19 DIAGNOSIS — R7309 Other abnormal glucose: Secondary | ICD-10-CM

## 2014-02-19 DIAGNOSIS — N3281 Overactive bladder: Secondary | ICD-10-CM

## 2014-02-19 DIAGNOSIS — G43909 Migraine, unspecified, not intractable, without status migrainosus: Secondary | ICD-10-CM

## 2014-02-19 DIAGNOSIS — E785 Hyperlipidemia, unspecified: Secondary | ICD-10-CM

## 2014-02-19 DIAGNOSIS — N529 Male erectile dysfunction, unspecified: Secondary | ICD-10-CM | POA: Insufficient documentation

## 2014-02-19 DIAGNOSIS — N318 Other neuromuscular dysfunction of bladder: Secondary | ICD-10-CM

## 2014-02-19 DIAGNOSIS — R7303 Prediabetes: Secondary | ICD-10-CM

## 2014-02-19 DIAGNOSIS — F419 Anxiety disorder, unspecified: Secondary | ICD-10-CM

## 2014-02-19 DIAGNOSIS — M109 Gout, unspecified: Secondary | ICD-10-CM

## 2014-02-19 DIAGNOSIS — F411 Generalized anxiety disorder: Secondary | ICD-10-CM

## 2014-02-19 DIAGNOSIS — R1032 Left lower quadrant pain: Secondary | ICD-10-CM

## 2014-02-19 DIAGNOSIS — K5732 Diverticulitis of large intestine without perforation or abscess without bleeding: Secondary | ICD-10-CM

## 2014-02-19 DIAGNOSIS — I10 Essential (primary) hypertension: Secondary | ICD-10-CM

## 2014-02-19 LAB — POCT CBC
Granulocyte percent: 74.4 %G (ref 37–80)
HCT, POC: 50.7 % (ref 43.5–53.7)
Hemoglobin: 16 g/dL (ref 14.1–18.1)
Lymph, poc: 2.1 (ref 0.6–3.4)
MCH, POC: 28 pg (ref 27–31.2)
MCHC: 31.6 g/dL — AB (ref 31.8–35.4)
MCV: 88.6 fL (ref 80–97)
MPV: 9.5 fL (ref 0–99.8)
POC Granulocyte: 6.8 (ref 2–6.9)
POC LYMPH PERCENT: 22.9 %L (ref 10–50)
Platelet Count, POC: 167 10*3/uL (ref 142–424)
RBC: 5.7 M/uL (ref 4.69–6.13)
RDW, POC: 15.1 %
WBC: 9.2 10*3/uL (ref 4.6–10.2)

## 2014-02-19 LAB — POCT UA - MICROSCOPIC ONLY
Bacteria, U Microscopic: NEGATIVE
Casts, Ur, LPF, POC: NEGATIVE
Crystals, Ur, HPF, POC: NEGATIVE
Mucus, UA: NEGATIVE
RBC, urine, microscopic: NEGATIVE
WBC, Ur, HPF, POC: NEGATIVE
Yeast, UA: NEGATIVE

## 2014-02-19 LAB — POCT URINALYSIS DIPSTICK
Bilirubin, UA: NEGATIVE
Blood, UA: NEGATIVE
Glucose, UA: NEGATIVE
Ketones, UA: NEGATIVE
Leukocytes, UA: NEGATIVE
Nitrite, UA: NEGATIVE
Protein, UA: NEGATIVE
Spec Grav, UA: 1.01
Urobilinogen, UA: NEGATIVE
pH, UA: 6

## 2014-02-19 MED ORDER — METRONIDAZOLE 500 MG PO TABS: 500.0000 mg | ORAL_TABLET | Freq: Three times a day (TID) | ORAL | Status: DC

## 2014-02-19 MED ORDER — CLONAZEPAM 1 MG PO TABS: ORAL_TABLET | ORAL | Status: DC

## 2014-02-19 MED ORDER — SILDENAFIL CITRATE 100 MG PO TABS: 100.0000 mg | ORAL_TABLET | Freq: Every day | ORAL | Status: DC | PRN

## 2014-02-19 MED ORDER — CIPROFLOXACIN HCL 500 MG PO TABS: 500.0000 mg | ORAL_TABLET | Freq: Two times a day (BID) | ORAL | Status: DC

## 2014-02-19 NOTE — Patient Instructions (Signed)
Diverticulitis °A diverticulum is a small pouch or sac on the colon. Diverticulosis is the presence of these diverticula on the colon. Diverticulitis is the irritation (inflammation) or infection of diverticula. °CAUSES  °The colon and its diverticula contain bacteria. If food particles block the tiny opening to a diverticulum, the bacteria inside can grow and cause an increase in pressure. This leads to infection and inflammation and is called diverticulitis. °SYMPTOMS  °· Abdominal pain and tenderness. Usually, the pain is located on the left side of your abdomen. However, it could be located elsewhere. °· Fever. °· Bloating. °· Feeling sick to your stomach (nausea). °· Throwing up (vomiting). °· Abnormal stools. °DIAGNOSIS  °Your caregiver will take a history and perform a physical exam. Since many things can cause abdominal pain, other tests may be necessary. Tests may include: °· Blood tests. °· Urine tests. °· X-ray of the abdomen. °· CT scan of the abdomen. °Sometimes, surgery is needed to determine if diverticulitis or other conditions are causing your symptoms. °TREATMENT  °Most of the time, you can be treated without surgery. Treatment includes: °· Resting the bowels by only having liquids for a few days. As you improve, you will need to eat a low-fiber diet. °· Intravenous (IV) fluids if you are losing body fluids (dehydrated). °· Antibiotic medicines that treat infections may be given. °· Pain and nausea medicine, if needed. °· Surgery if the inflamed diverticulum has burst. °HOME CARE INSTRUCTIONS  °· Try a clear liquid diet (broth, tea, or water for as long as directed by your caregiver). You may then gradually begin a low-fiber diet as tolerated.  °A low-fiber diet is a diet with less than 10 grams of fiber. Choose the foods below to reduce fiber in the diet: °· White breads, cereals, rice, and pasta. °· Cooked fruits and vegetables or soft fresh fruits and vegetables without the skin. °· Ground or  well-cooked tender beef, ham, veal, lamb, pork, or poultry. °· Eggs and seafood. °· After your diverticulitis symptoms have improved, your caregiver may put you on a high-fiber diet. A high-fiber diet includes 14 grams of fiber for every 1000 calories consumed. For a standard 2000 calorie diet, you would need 28 grams of fiber. Follow these diet guidelines to help you increase the fiber in your diet. It is important to slowly increase the amount fiber in your diet to avoid gas, constipation, and bloating. °· Choose whole-grain breads, cereals, pasta, and brown rice. °· Choose fresh fruits and vegetables with the skin on. Do not overcook vegetables because the more vegetables are cooked, the more fiber is lost. °· Choose more nuts, seeds, legumes, dried peas, beans, and lentils. °· Look for food products that have greater than 3 grams of fiber per serving on the Nutrition Facts label. °· Take all medicine as directed by your caregiver. °· If your caregiver has given you a follow-up appointment, it is very important that you go. Not going could result in lasting (chronic) or permanent injury, pain, and disability. If there is any problem keeping the appointment, call to reschedule. °SEEK MEDICAL CARE IF:  °· Your pain does not improve. °· You have a hard time advancing your diet beyond clear liquids. °· Your bowel movements do not return to normal. °SEEK IMMEDIATE MEDICAL CARE IF:  °· Your pain becomes worse. °· You have an oral temperature above 102° F (38.9° C), not controlled by medicine. °· You have repeated vomiting. °· You have bloody or black, tarry stools. °·   Symptoms that brought you to your caregiver become worse or are not getting better. °MAKE SURE YOU:  °· Understand these instructions. °· Will watch your condition. °· Will get help right away if you are not doing well or get worse. °Document Released: 07/26/2005 Document Revised: 01/08/2012 Document Reviewed: 11/21/2010 °Exit[employer]® Patient Information  ©2014 [employer]. ° °

## 2014-02-19 NOTE — Progress Notes (Signed)
Patient ID: Jonathan Love, male   DOB: [birth year], 68 y.o.   MRN: [id number] SUBJECTIVE: CC: Chief Complaint  Patient presents with  . Follow-up    3 m onth follow up chronic problems  alert and oriented to date time and placelc/o lower intestinal pain states 1 month ago zate bad chicken and took pepto bismol it went away and pain still lingering on.   . Medication Refill    refill clonazepam and wants refill on viagra which runs out in june    HPI: Patient is here for follow up of hyperlipidemia: denies Headache;denies Chest Pain;denies weakness;denies Shortness of Breath and orthopnea;denies Visual changes;denies palpitations;denies cough;denies pedal edema;denies symptoms of TIA or stroke;deniesClaudication symptoms. admits to Compliance with medications; denies Problems with medications.  Ate some old chicken 2 months ago.had low abdominal pain. Loose stools. No blood , no melena.    Past Medical History  Diagnosis Date  . Hypertension   . GERD (gastroesophageal reflux disease)   . Prediabetes   . Hyperlipidemia   . Gout   . Migraine   . CTS (carpal tunnel syndrome)     left  . 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 OAB (overactive bladder)   . Anxiety    Past Surgical History  Procedure Laterality Date  . Vocal cord nodule    . Right lateral epicondylitis     History   Social History  . Marital Status: Married    Spouse Name: N/A    Number of Children: N/A  . Years of Education: N/A   Occupational History  . Not on file.   Social History Main Topics  . Smoking status: Never Smoker   . Smokeless tobacco: Not on file  . Alcohol Use: Not on file  . Drug Use: Not on file  . Sexual Activity: Not on file   Other Topics Concern  . Not on file   Social History Narrative  . No narrative on file   No family history on file. Current Outpatient Prescriptions on File Prior to Visit  Medication Sig Dispense Refill  . allopurinol (ZYLOPRIM) 300 MG tablet Take 1 tablet  (300 mg total) by mouth daily.  30 tablet  11  . atenolol (TENORMIN) 100 MG tablet Take 1 tablet (100 mg total) by mouth daily.  30 tablet  5  . atorvastatin (LIPITOR) 40 MG tablet Take 1 tablet (40 mg total) by mouth daily.  30 tablet  5  . benazepril (LOTENSIN) 10 MG tablet Take 1 tablet (10 mg total) by mouth daily.  30 tablet  5  . oxybutynin (DITROPAN) 5 MG tablet Take 1 tablet (5 mg total) by mouth 2 (two) times daily.  60 tablet  5  . BOOSTRIX 5-2.5-18.5 injection       . NEOMYCIN-POLYMYXIN-HYDROCORTISONE (CORTISPORIN) 1 % SOLN otic solution Place 3 drops into the right ear 4 (four) times daily.  10 mL  0   No current facility-administered medications on file prior to visit.   Allergies  Allergen Reactions  . Penicillins    Immunization History  Administered Date(s) Administered  . Influenza Split 07/14/2013  . Tdap 08/12/2013   Prior to Admission medications   Medication Sig Start Date End Date Taking? Authorizing Provider  allopurinol (ZYLOPRIM) 300 MG tablet Take 1 tablet (300 mg total) by mouth daily. 11/14/13  Yes Vernie Shanks, MD  atenolol (TENORMIN) 100 MG tablet Take 1 tablet (100 mg total) by mouth daily. 11/14/13  Yes Vernie Shanks,  MD  atorvastatin (LIPITOR) 40 MG tablet Take 1 tablet (40 mg total) by mouth daily. 11/14/13  Yes Vernie Shanks, MD  benazepril (LOTENSIN) 10 MG tablet Take 1 tablet (10 mg total) by mouth daily. 11/14/13  Yes Vernie Shanks, MD  clonazePAM (KLONOPIN) 1 MG tablet TAKE ONE TABLET BY MOUTH TWICE DAILY AS NEEDED 11/14/13  Yes Vernie Shanks, MD  oxybutynin (DITROPAN) 5 MG tablet Take 1 tablet (5 mg total) by mouth 2 (two) times daily. 11/14/13  Yes Vernie Shanks, MD  sildenafil (VIAGRA) 100 MG tablet Take 1 tablet (100 mg total) by mouth daily as needed for erectile dysfunction. 04/09/13  Yes Vernie Shanks, MD  [place] 5-2.5-18.5 injection  08/12/13   Historical Provider, MD  NEOMYCIN-POLYMYXIN-HYDROCORTISONE (CORTISPORIN) 1 % SOLN otic solution  Place 3 drops into the right ear 4 (four) times daily. 08/12/13   Vernie Shanks, MD     ROS: As above in the HPI. All other systems are stable or negative.  OBJECTIVE: APPEARANCE:  Patient in no acute distress.The patient appeared well nourished and normally developed. Acyanotic. Waist: VITAL SIGNS:BP 136/77  Pulse 66  Temp(Src) 98 F (36.7 C) (Oral)  Ht _0  (1.676 m)  Wt 185 lb 9.6 oz (84.188 kg)  BMI 29.97 kg/m2 WM overweight  SKIN: warm and  Dry without overt rashes, tattoos and scars  HEAD and Neck: without JVD, Head and scalp: normal Eyes:No scleral icterus. Fundi normal, eye movements normal. Ears: Auricle normal, canal normal, Tympanic membranes normal, insufflation normal. Nose: normal Throat: normal Neck & thyroid: normal  CHEST & LUNGS: Chest wall: normal Lungs: Clear  CVS: Reveals the PMI to be normally located. Regular rhythm, First and Second Heart sounds are normal,  absence of murmurs, rubs or gallops. Peripheral vasculature: Radial pulses: normal Dorsal pedis pulses: normal Posterior pulses: normal  ABDOMEN:  Appearance: overweight Tender LLQ mildly tender., no organomegaly, no masses, no Abdominal Aortic enlargement. No Guarding , no rebound. No Bruits. Bowel sounds: normal  RECTAL: heme negative brown stools. Prostate moderately enlarged.  GU: N/A  EXTREMETIES: nonedematous.  MUSCULOSKELETAL:  Spine: normal Joints: intact  NEUROLOGIC: oriented to time,place and person; nonfocal. Strength is normal Sensory is normal Reflexes are normal Cranial Nerves are normal.  ASSESSMENT: Prediabetes  HTN (hypertension) - Plan: CMP14+EGFR  HLD (hyperlipidemia) - Plan: CMP14+EGFR, NMR, lipoprofile  OAB (overactive bladder)  Migraine  Gout - Plan: Uric acid  GERD (gastroesophageal reflux disease)  Anxiety - Plan: clonazePAM (KLONOPIN) 1 MG tablet  Erectile dysfunction - Plan: sildenafil (VIAGRA) 100 MG tablet, DISCONTINUED:  sildenafil (VIAGRA) 100 MG tablet  Abdominal pain, left lower quadrant - Plan: POCT CBC, CMP14+EGFR, Stool culture, Clostridium difficile EIA, POCT urinalysis dipstick, POCT UA - Microscopic Only, DG Abd 2 Views, ciprofloxacin (CIPRO) 500 MG tablet, metroNIDAZOLE (FLAGYL) 500 MG tablet  Diverticulitis of colon without hemorrhage - Plan: ciprofloxacin (CIPRO) 500 MG tablet, metroNIDAZOLE (FLAGYL) 500 MG tablet Possible diverticulitis  PLAN:  Orders Placed This Encounter  Procedures  . Stool culture  . Clostridium difficile EIA  . DG Abd 2 Views    Standing Status: Future     Number of Occurrences: 1     Standing Expiration Date: 04/22/2015    Order Specific Question:  Reason for Exam (SYMPTOM  OR DIAGNOSIS REQUIRED)    Answer:  LLQ avbdominal apin    Order Specific Question:  Preferred imaging location?    Answer:  Internal  . CMP14+EGFR  . NMR, lipoprofile  .  Uric acid  . POCT CBC  . POCT urinalysis dipstick  . POCT UA - Microscopic Only  [facility] reading (PRIMARY) by  Dr. Jacelyn Love: stools fill the right ascending colon  , no acute findings.                                Results for orders placed in visit on 02/19/14  POCT CBC      Result Value Ref Range   WBC 9.2  4.6 - 10.2 K/uL   Lymph, poc 2.1  0.6 - 3.4   POC LYMPH PERCENT 22.9  10 - 50 %L   MID (cbc)    0 - 0.9   POC MID %    0 - 12 %M   POC Granulocyte 6.8  2 - 6.9   Granulocyte percent 74.4  37 - 80 %G   RBC 5.7  4.69 - 6.13 M/uL   Hemoglobin 16.0  14.1 - 18.1 g/dL   HCT, POC 50.7  43.5 - 53.7 %   MCV 88.6  80 - 97 fL   MCH, POC 28.0  27 - 31.2 pg   MCHC 31.6 (*) 31.8 - 35.4 g/dL   RDW, POC 15.1     Platelet Count, POC 167.0  142 - 424 K/uL   MPV 9.5  0 - 99.8 fL  POCT URINALYSIS DIPSTICK      Result Value Ref Range   Color, UA YELLOW     Clarity, UA CELAR     Glucose, UA NEG     Bilirubin, UA NEG     Ketones, UA NEG     Spec Grav, UA 1.010     Blood, UA NEG     pH, UA 6.0     Protein, UA NEG      Urobilinogen, UA negative     Nitrite, UA NEG     Leukocytes, UA Negative    POCT UA - MICROSCOPIC ONLY      Result Value Ref Range   WBC, Ur, HPF, POC NEG     RBC, urine, microscopic NEG     Bacteria, U Microscopic NEG     Mucus, UA NEG     Epithelial cells, urine per micros OCC     Crystals, Ur, HPF, POC NEG     Casts, Ur, LPF, POC NEG     Yeast, UA NEG      Meds ordered this encounter  Medications  . DISCONTD: sildenafil (VIAGRA) 100 MG tablet    Sig: Take 1 tablet (100 mg total) by mouth daily as needed for erectile dysfunction.    Dispense:  10 tablet    Refill:  11  . clonazePAM (KLONOPIN) 1 MG tablet    Sig: TAKE ONE TABLET BY MOUTH TWICE DAILY AS NEEDED    Dispense:  60 tablet    Refill:  2  . sildenafil (VIAGRA) 100 MG tablet    Sig: Take 1 tablet (100 mg total) by mouth daily as needed for erectile dysfunction.    Dispense:  10 tablet    Refill:  11  . ciprofloxacin (CIPRO) 500 MG tablet    Sig: Take 1 tablet (500 mg total) by mouth 2 (two) times daily.    Dispense:  20 tablet    Refill:  0  . metroNIDAZOLE (FLAGYL) 500 MG tablet    Sig: Take 1 tablet (500 mg total) by mouth 3 (three) times daily.  Dispense:  30 tablet    Refill:  0   Medications Discontinued During This Encounter  Medication Reason  . sildenafil (VIAGRA) 100 MG tablet Reorder  . clonazePAM (KLONOPIN) 1 MG tablet Reorder  . sildenafil (VIAGRA) 100 MG tablet Reorder   Return in about 5 days (around 02/24/2014) for Recheck medical problems.  Jonathan Love, M.D.

## 2014-02-20 ENCOUNTER — Other Ambulatory Visit: Payer: Medicare Other

## 2014-02-20 LAB — CMP14+EGFR
ALT: 24 IU/L (ref 0–44)
AST: 27 IU/L (ref 0–40)
Albumin/Globulin Ratio: 2 (ref 1.1–2.5)
Albumin: 4.9 g/dL — ABNORMAL HIGH (ref 3.6–4.8)
Alkaline Phosphatase: 140 IU/L — ABNORMAL HIGH (ref 39–117)
BUN/Creatinine Ratio: 10 (ref 10–22)
BUN: 11 mg/dL (ref 8–27)
CO2: 27 mmol/L (ref 18–29)
Calcium: 10.1 mg/dL (ref 8.6–10.2)
Chloride: 97 mmol/L (ref 97–108)
Creatinine, Ser: 1.14 mg/dL (ref 0.76–1.27)
GFR calc Af Amer: 77 mL/min/{1.73_m2} (ref >59)
GFR calc non Af Amer: 66 mL/min/{1.73_m2} (ref >59)
Globulin, Total: 2.4 g/dL (ref 1.5–4.5)
Glucose: 111 mg/dL — ABNORMAL HIGH (ref 65–99)
Potassium: 5.2 mmol/L (ref 3.5–5.2)
Sodium: 141 mmol/L (ref 134–144)
Total Bilirubin: 0.8 mg/dL (ref 0.0–1.2)
Total Protein: 7.3 g/dL (ref 6.0–8.5)

## 2014-02-20 LAB — NMR, LIPOPROFILE
Cholesterol: 156 mg/dL (ref <200)
HDL Cholesterol by NMR: 48 mg/dL (ref >=40)
HDL Particle Number: 39.4 umol/L (ref >=30.5)
LDL Particle Number: 758 nmol/L (ref <1000)
LDL Size: 20.5 nm (ref >20.5)
LDLC SERPL CALC-MCNC: 64 mg/dL (ref <100)
LP-IR Score: 56 — ABNORMAL HIGH (ref <=45)
Small LDL Particle Number: 359 nmol/L (ref <=527)
Triglycerides by NMR: 222 mg/dL — ABNORMAL HIGH (ref <150)

## 2014-02-20 LAB — URIC ACID: Uric Acid: 4.6 mg/dL (ref 3.7–8.6)

## 2014-02-20 NOTE — Addendum Note (Signed)
Addended by: Earlene Plater on: 02/20/2014 02:55 PM   Modules accepted: Orders

## 2014-02-20 NOTE — Progress Notes (Signed)
Pt came in for labs only dropped off for order yesterday

## 2014-02-22 LAB — CLOSTRIDIUM DIFFICILE BY PCR: Toxigenic C. Difficile by PCR: NEGATIVE

## 2014-02-22 NOTE — Progress Notes (Signed)
Quick Note:  Call patient. Labs normal.C diff test was negative on the stools. No change in plan. ______

## 2014-02-24 LAB — STOOL CULTURE: E coli, Shiga toxin Assay: NEGATIVE

## 2014-02-25 ENCOUNTER — Encounter: Payer: Self-pay | Admitting: Family

## 2014-02-25 ENCOUNTER — Ambulatory Visit (INDEPENDENT_AMBULATORY_CARE_PROVIDER_SITE_OTHER): Payer: Medicare Other | Admitting: Family

## 2014-02-25 VITALS — BP 152/88 | HR 82 | Temp 97.5°F | Ht 66.0 in | Wt 186.4 lb

## 2014-02-25 DIAGNOSIS — K5732 Diverticulitis of large intestine without perforation or abscess without bleeding: Secondary | ICD-10-CM

## 2014-02-25 DIAGNOSIS — K5792 Diverticulitis of intestine, part unspecified, without perforation or abscess without bleeding: Secondary | ICD-10-CM

## 2014-02-25 NOTE — Progress Notes (Signed)
Quick Note:  Call patient. Labs normal.stool culture negative. Awaiting the c diff toxin test No change in plan. ______

## 2014-02-25 NOTE — Progress Notes (Signed)
   Subjective:    Patient ID: Jonathan Love, male    DOB: [birth year], 68 y.o.   MRN: [id number]  HPI Pt here for follow-up for diverticulitis. Pt was started on Cipro BID and Flagyl TID last week. Pt reports feeling "a whole lot better". Pt reports some mild tenderness in lower mild abdominal, but it "nothing like it was".    Review of Systems  Constitutional: Negative.   HENT: Negative.   Respiratory: Negative.   Cardiovascular: Negative.   Gastrointestinal: Positive for abdominal pain. Negative for nausea, vomiting, blood in stool and rectal pain.  Endocrine: Negative.   Genitourinary: Negative.   Musculoskeletal: Negative.   Neurological: Negative.   Psychiatric/Behavioral: Negative.        Objective:   Physical Exam  Vitals reviewed. Constitutional: He is oriented to person, place, and time. He appears well-developed and well-nourished. No distress.  Cardiovascular: Normal rate, regular rhythm, normal heart sounds and intact distal pulses.   No murmur heard. Pulmonary/Chest: Effort normal and breath sounds normal. No respiratory distress. He has no wheezes.  Abdominal: Soft. Bowel sounds are normal. He exhibits no distension. There is tenderness.  Abd tenderness present in lower abd area   Musculoskeletal: Normal range of motion. He exhibits no edema and no tenderness.  Neurological: He is alert and oriented to person, place, and time. He has normal reflexes. No cranial nerve deficit.  Skin: Skin is warm and dry. No rash noted. No erythema.  Psychiatric: He has a normal mood and affect. His behavior is normal. Judgment and thought content normal.    BP 152/88  Pulse 82  Temp(Src) 97.5 F (36.4 C) (Oral)  Ht 5\' 6"  (1.676 m)  Wt 186 lb 6.4 oz (84.55 kg)  BMI 30.10 kg/m2       Assessment & Plan:  1. Diverticulitis -High fiber diet -Start probiotic daily -Add daily yogurt  - Ambulatory referral to Gastroenterology -RTO if s/s worsens   Evelina Dun, FNP

## 2014-02-25 NOTE — Patient Instructions (Signed)
Diverticulitis °A diverticulum is a small pouch or sac on the colon. Diverticulosis is the presence of these diverticula on the colon. Diverticulitis is the irritation (inflammation) or infection of diverticula. °CAUSES  °The colon and its diverticula contain bacteria. If food particles block the tiny opening to a diverticulum, the bacteria inside can grow and cause an increase in pressure. This leads to infection and inflammation and is called diverticulitis. °SYMPTOMS  °· Abdominal pain and tenderness. Usually, the pain is located on the left side of your abdomen. However, it could be located elsewhere. °· Fever. °· Bloating. °· Feeling sick to your stomach (nausea). °· Throwing up (vomiting). °· Abnormal stools. °DIAGNOSIS  °Your caregiver will take a history and perform a physical exam. Since many things can cause abdominal pain, other tests may be necessary. Tests may include: °· Blood tests. °· Urine tests. °· X-ray of the abdomen. °· CT scan of the abdomen. °Sometimes, surgery is needed to determine if diverticulitis or other conditions are causing your symptoms. °TREATMENT  °Most of the time, you can be treated without surgery. Treatment includes: °· Resting the bowels by only having liquids for a few days. As you improve, you will need to eat a low-fiber diet. °· Intravenous (IV) fluids if you are losing body fluids (dehydrated). °· Antibiotic medicines that treat infections may be given. °· Pain and nausea medicine, if needed. °· Surgery if the inflamed diverticulum has burst. °HOME CARE INSTRUCTIONS  °· Try a clear liquid diet (broth, tea, or water for as long as directed by your caregiver). You may then gradually begin a low-fiber diet as tolerated.  °A low-fiber diet is a diet with less than 10 grams of fiber. Choose the foods below to reduce fiber in the diet: °· White breads, cereals, rice, and pasta. °· Cooked fruits and vegetables or soft fresh fruits and vegetables without the skin. °· Ground or  well-cooked tender beef, ham, veal, lamb, pork, or poultry. °· Eggs and seafood. °· After your diverticulitis symptoms have improved, your caregiver may put you on a high-fiber diet. A high-fiber diet includes 14 grams of fiber for every 1000 calories consumed. For a standard 2000 calorie diet, you would need 28 grams of fiber. Follow these diet guidelines to help you increase the fiber in your diet. It is important to slowly increase the amount fiber in your diet to avoid gas, constipation, and bloating. °· Choose whole-grain breads, cereals, pasta, and brown rice. °· Choose fresh fruits and vegetables with the skin on. Do not overcook vegetables because the more vegetables are cooked, the more fiber is lost. °· Choose more nuts, seeds, legumes, dried peas, beans, and lentils. °· Look for food products that have greater than 3 grams of fiber per serving on the Nutrition Facts label. °· Take all medicine as directed by your caregiver. °· If your caregiver has given you a follow-up appointment, it is very important that you go. Not going could result in lasting (chronic) or permanent injury, pain, and disability. If there is any problem keeping the appointment, call to reschedule. °SEEK MEDICAL CARE IF:  °· Your pain does not improve. °· You have a hard time advancing your diet beyond clear liquids. °· Your bowel movements do not return to normal. °SEEK IMMEDIATE MEDICAL CARE IF:  °· Your pain becomes worse. °· You have an oral temperature above 102° F (38.9° C), not controlled by medicine. °· You have repeated vomiting. °· You have bloody or black, tarry stools. °·   Symptoms that brought you to your caregiver become worse or are not getting better. °MAKE SURE YOU:  °· Understand these instructions. °· Will watch your condition. °· Will get help right away if you are not doing well or get worse. °Document Released: 07/26/2005 Document Revised: 01/08/2012 Document Reviewed: 11/21/2010 °Exit[employer]® Patient Information  ©2014 [employer]. ° °

## 2014-02-26 ENCOUNTER — Encounter: Payer: Self-pay | Admitting: Internal Medicine

## 2014-02-27 ENCOUNTER — Ambulatory Visit: Payer: Medicare Other | Admitting: Family Medicine

## 2014-04-21 ENCOUNTER — Encounter: Payer: Self-pay | Admitting: Internal Medicine

## 2014-04-21 ENCOUNTER — Ambulatory Visit (INDEPENDENT_AMBULATORY_CARE_PROVIDER_SITE_OTHER): Payer: Medicare Other | Admitting: Internal Medicine

## 2014-04-21 VITALS — BP 120/82 | HR 78 | Ht 66.0 in | Wt 186.8 lb

## 2014-04-21 DIAGNOSIS — Z1211 Encounter for screening for malignant neoplasm of colon: Secondary | ICD-10-CM

## 2014-04-21 DIAGNOSIS — K5732 Diverticulitis of large intestine without perforation or abscess without bleeding: Secondary | ICD-10-CM

## 2014-04-21 MED ORDER — MOVIPREP 100 G PO SOLR: 1.0000 | Freq: Once | ORAL | Status: DC

## 2014-04-21 NOTE — Progress Notes (Signed)
HISTORY OF PRESENT ILLNESS:  Jonathan Love is a 68 y.o. male with past medical history as listed below. He was seen here remotely for possible GERD. History of significant anxiety. He presents today after being diagnosed with diverticulitis. Also, the need for followup colonoscopy. Last colonoscopy reported to be 2002 (report being requested). Was to have had repeat colonoscopy in 2012. Delayed because of troubles with gout. In April develop recurrent lower abdominal pain. He was diagnosed with diverticulitis. Plain films of the abdomen and routine blood work unrevealing. He was treated with Cipro and Flagyl. Symptoms have resolved. No complaints at this time. Anxious to schedule his procedure, as recommended by his PCP  REVIEW OF SYSTEMS:  All non-GI ROS negative except for anxiety, sore throat  Past Medical History  Diagnosis Date  . Hypertension   . GERD (gastroesophageal reflux disease)   . Prediabetes   . Hyperlipidemia   . Gout   . Migraine   . CTS (carpal tunnel syndrome)     left  . 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 OAB (overactive bladder)   . Anxiety   . Diverticulitis     Past Surgical History  Procedure Laterality Date  . Vocal cord nodule    . Right lateral epicondylitis      Social History [name]  reports that he has never smoked. He has never used smokeless tobacco. He reports that he does not drink alcohol or use illicit drugs.  family history includes Heart disease in his mother; Lung cancer in his father; Stomach cancer in his father.  Allergies  Allergen Reactions  . Penicillins        PHYSICAL EXAMINATION: Vital signs: BP 120/82  Pulse 78  Ht 5\' 6"  (1.676 m)  Wt 186 lb 12.8 oz (84.732 kg)  BMI 30.16 kg/m2  Constitutional: generally well-appearing, no acute distress Psychiatric: alert and oriented x3, cooperative Eyes: extraocular movements intact, anicteric, conjunctiva pink Mouth: oral pharynx moist, no lesions Neck: supple no  lymphadenopathy Cardiovascular: heart regular rate and rhythm, no murmur Lungs: clear to auscultation bilaterally Abdomen: soft, nontender, nondistended, no obvious ascites, no peritoneal signs, normal bowel sounds, no organomegaly Rectal: Deferred until colonoscopy Extremities: no lower extremity edema bilaterally Skin: no lesions on visible extremities Neuro: No focal deficits.   ASSESSMENT:  #1. Probable diverticulitis. Successfully treated with antibiotics April 2015 #2. Screening colonoscopy 2002. Slightly overdue for followup #3. History of GERD #4. Chronic anxiety   PLAN:  #1. Surveillance colonoscopy.The nature of the procedure, as well as the risks, benefits, and alternatives were carefully and thoroughly reviewed with the patient. Ample time for discussion and questions allowed. The patient understood, was satisfied, and agreed to proceed. Movi prep prescribed. Patient instructed on his use

## 2014-04-21 NOTE — Patient Instructions (Signed)

## 2014-04-22 ENCOUNTER — Encounter: Payer: Self-pay | Admitting: Internal Medicine

## 2014-04-22 ENCOUNTER — Ambulatory Visit (AMBULATORY_SURGERY_CENTER): Payer: Medicare Other | Admitting: Internal Medicine

## 2014-04-22 VITALS — BP 133/80 | HR 68 | Temp 98.1°F | Resp 21 | Ht 66.0 in | Wt 186.0 lb

## 2014-04-22 DIAGNOSIS — D126 Benign neoplasm of colon, unspecified: Secondary | ICD-10-CM

## 2014-04-22 DIAGNOSIS — K5732 Diverticulitis of large intestine without perforation or abscess without bleeding: Secondary | ICD-10-CM

## 2014-04-22 DIAGNOSIS — D120 Benign neoplasm of cecum: Secondary | ICD-10-CM

## 2014-04-22 DIAGNOSIS — Z1211 Encounter for screening for malignant neoplasm of colon: Secondary | ICD-10-CM

## 2014-04-22 MED ORDER — SODIUM CHLORIDE 0.9 % IV SOLN: 500.0000 mL | INTRAVENOUS | Status: DC

## 2014-04-22 NOTE — Progress Notes (Signed)
A/ox3, pleased with MAC, report to RN 

## 2014-04-22 NOTE — Op Note (Signed)
[facility]  [address] [place], [id number]   COLONOSCOPY PROCEDURE REPORT  PATIENT: Jonathan Love, Krueger  MR#: [id number] BIRTHDATE: [birth year] , 24  yrs. old GENDER: Male ENDOSCOPIST: Eustace Quail, MD REFERRED BY:.  Self / Office PROCEDURE DATE:  04/22/2014 PROCEDURE:   Colonoscopy with snare polypectomy x 1 First Screening Colonoscopy - Avg.  risk and is 50 yrs.  old or older - No.  Prior Negative Screening - Now for repeat screening. 10 or more years since last screening  History of Adenoma - Now for follow-up colonoscopy & has been > or = to 3 yrs.  N/A  Polyps Removed Today? Yes. ASA CLASS:   Class II INDICATIONS:average risk screening.   Negative index exam 2002 MEDICATIONS: MAC sedation, administered by CRNA and propofol (Diprivan) 280mg  IV  DESCRIPTION OF PROCEDURE:   After the risks benefits and alternatives of the procedure were thoroughly explained, informed consent was obtained.  A digital rectal exam revealed no abnormalities of the rectum.   The LB [id number]  endoscope was introduced through the anus and advanced to the cecum, which was identified by both the appendix and ileocecal valve. No adverse events experienced.   The quality of the prep was excellent, using MoviPrep  The instrument was then slowly withdrawn as the colon was fully examined.  COLON FINDINGS: A diminutive polyp was found at the cecum.  A polypectomy was performed with a cold snare.  The resection was complete and the polyp tissue was completely retrieved.   Mild diverticulosis was noted in the ascending colon.   The colon mucosa was otherwise normal.  Retroflexed views revealed internal hemorrhoids. The time to cecum=3 minutes 27 seconds.  Withdrawal time=7 minutes 9 seconds.  The scope was withdrawn and the procedure completed. COMPLICATIONS: There were no complications.  ENDOSCOPIC IMPRESSION: 1.   Diminutive polyp was found at the cecum; polypectomy  was performed with a cold snare 2.   Mild diverticulosis was noted in the ascending colon 3.   The colon mucosa was otherwise normal  RECOMMENDATIONS: 1. Repeat colonoscopy in 5 years if polyp adenomatous; otherwise 10 years   eSigned:  Eustace Quail, MD 04/22/2014 8:39 AM   cc: Redge Gainer, MD and The Patient

## 2014-04-22 NOTE — Patient Instructions (Signed)

## 2014-04-22 NOTE — Progress Notes (Signed)
Called to room to assist during endoscopic procedure.  Patient ID and intended procedure confirmed with present staff. Received instructions for my participation in the procedure from the performing physician.  

## 2014-04-23 ENCOUNTER — Telehealth: Payer: Self-pay

## 2014-04-23 NOTE — Telephone Encounter (Signed)
  Follow up Call-  Call back number 04/22/2014  Post procedure Call Back phone  # [phone]  Permission to leave phone message Yes     Patient questions:  Do you have a fever, pain , or abdominal swelling? no Pain Score  0 *  Have you tolerated food without any problems? yes  Have you been able to return to your normal activities? yes  Do you have any questions about your discharge instructions: Diet   no Medications  no Follow up visit  no  Do you have questions or concerns about your Care? no  Actions: * If pain score is 4 or above: No action needed, pain <4.  The pt was asleep, I spoke with his wife.  She said, "he did fine". maw

## 2014-04-27 ENCOUNTER — Encounter: Payer: Self-pay | Admitting: Internal Medicine

## 2014-05-26 ENCOUNTER — Encounter: Payer: Self-pay | Admitting: Family Medicine

## 2014-05-26 ENCOUNTER — Ambulatory Visit (INDEPENDENT_AMBULATORY_CARE_PROVIDER_SITE_OTHER): Payer: Medicare Other | Admitting: Family Medicine

## 2014-05-26 VITALS — BP 136/75 | HR 63 | Temp 97.9°F | Ht 66.0 in | Wt 187.0 lb

## 2014-05-26 DIAGNOSIS — E785 Hyperlipidemia, unspecified: Secondary | ICD-10-CM

## 2014-05-26 DIAGNOSIS — F411 Generalized anxiety disorder: Secondary | ICD-10-CM

## 2014-05-26 DIAGNOSIS — I10 Essential (primary) hypertension: Secondary | ICD-10-CM

## 2014-05-26 DIAGNOSIS — N3281 Overactive bladder: Secondary | ICD-10-CM

## 2014-05-26 DIAGNOSIS — F419 Anxiety disorder, unspecified: Secondary | ICD-10-CM

## 2014-05-26 DIAGNOSIS — N318 Other neuromuscular dysfunction of bladder: Secondary | ICD-10-CM

## 2014-05-26 MED ORDER — CLONAZEPAM 1 MG PO TABS: ORAL_TABLET | ORAL | Status: DC

## 2014-05-26 MED ORDER — BENAZEPRIL HCL 10 MG PO TABS
10.0000 mg | ORAL_TABLET | Freq: Every day | ORAL | Status: DC
Start: 2014-05-26 — End: 2014-12-01

## 2014-05-26 MED ORDER — OXYBUTYNIN CHLORIDE 5 MG PO TABS: 5.0000 mg | ORAL_TABLET | Freq: Two times a day (BID) | ORAL | Status: DC

## 2014-05-26 MED ORDER — ALLOPURINOL 300 MG PO TABS: 300.0000 mg | ORAL_TABLET | Freq: Every day | ORAL | Status: DC

## 2014-05-26 MED ORDER — ATORVASTATIN CALCIUM 40 MG PO TABS: 40.0000 mg | ORAL_TABLET | Freq: Every day | ORAL | Status: DC

## 2014-05-26 MED ORDER — ATENOLOL 100 MG PO TABS: 100.0000 mg | ORAL_TABLET | Freq: Every day | ORAL | Status: DC

## 2014-05-26 NOTE — Addendum Note (Signed)
Addended by: Ilean China on: 05/26/2014 01:59 PM   Modules accepted: Orders

## 2014-05-26 NOTE — Progress Notes (Signed)
   Subjective:    Patient ID: Jonathan Love, male    DOB: 28-May-1946, 68 y.o.   MRN: [id number]  HPI  68 year old male who is here primarily to get refills. At some length he explains his problem with anxiety as it relates to activities in [place] conflict. He he is concerned that he is not usually given a 6 month refill of his clonazepam and he is wondering if I can do that    Review of Systems  Constitutional: Negative.   HENT: Negative.   Respiratory: Negative.   Cardiovascular: Negative.   Gastrointestinal: Negative.   Genitourinary: Negative.   Neurological: Negative.   Psychiatric/Behavioral: The patient is nervous/anxious.        Objective:   Physical Exam  Constitutional: He is oriented to person, place, and time. He appears well-developed and well-nourished.  HENT:  Head: Normocephalic.  Right Ear: External ear normal.  Left Ear: External ear normal.  Nose: Nose normal.  Mouth/Throat: Oropharynx is clear and moist.  Eyes: Conjunctivae and EOM are normal. Pupils are equal, round, and reactive to light.  Neck: Normal range of motion. Neck supple.  Cardiovascular: Normal rate, regular rhythm, normal heart sounds and intact distal pulses.   Pulmonary/Chest: Effort normal and breath sounds normal.  Abdominal: Soft. Bowel sounds are normal.  Musculoskeletal: Normal range of motion.  Neurological: He is alert and oriented to person, place, and time.  Skin: Skin is warm and dry.  Psychiatric: He has a normal mood and affect. His behavior is normal. Judgment and thought content normal.          Assessment & Plan:  1. Essential hypertension BP well controlled  2. Anxiety As long as he takes med, anxiety is controlled  Jonathan Honour MD - clonazePAM (KLONOPIN) 1 MG tablet; TAKE ONE TABLET BY MOUTH TWICE DAILY AS NEEDED  Dispense: 60 tablet; Refill: 5

## 2014-05-26 NOTE — Patient Instructions (Signed)
Is a little Place anxiety patient instructions here.

## 2014-08-14 ENCOUNTER — Other Ambulatory Visit: Payer: Self-pay

## 2014-12-01 ENCOUNTER — Encounter: Payer: Self-pay | Admitting: Family Medicine

## 2014-12-01 ENCOUNTER — Ambulatory Visit (INDEPENDENT_AMBULATORY_CARE_PROVIDER_SITE_OTHER): Payer: Medicare Other | Admitting: Family Medicine

## 2014-12-01 VITALS — BP 158/91 | HR 68 | Temp 98.0°F | Ht 66.0 in | Wt 189.0 lb

## 2014-12-01 DIAGNOSIS — K219 Gastro-esophageal reflux disease without esophagitis: Secondary | ICD-10-CM

## 2014-12-01 DIAGNOSIS — I10 Essential (primary) hypertension: Secondary | ICD-10-CM

## 2014-12-01 DIAGNOSIS — F419 Anxiety disorder, unspecified: Secondary | ICD-10-CM

## 2014-12-01 DIAGNOSIS — E785 Hyperlipidemia, unspecified: Secondary | ICD-10-CM

## 2014-12-01 DIAGNOSIS — N522 Drug-induced erectile dysfunction: Secondary | ICD-10-CM

## 2014-12-01 DIAGNOSIS — N3281 Overactive bladder: Secondary | ICD-10-CM

## 2014-12-01 MED ORDER — ATORVASTATIN CALCIUM 40 MG PO TABS: 40.0000 mg | ORAL_TABLET | Freq: Every day | ORAL | Status: DC

## 2014-12-01 MED ORDER — ATENOLOL 100 MG PO TABS: 100.0000 mg | ORAL_TABLET | Freq: Every day | ORAL | Status: DC

## 2014-12-01 MED ORDER — ESCITALOPRAM OXALATE 10 MG PO TABS: 10.0000 mg | ORAL_TABLET | Freq: Every day | ORAL | Status: DC

## 2014-12-01 MED ORDER — ALLOPURINOL 300 MG PO TABS: 300.0000 mg | ORAL_TABLET | Freq: Every day | ORAL | Status: DC

## 2014-12-01 MED ORDER — OXYBUTYNIN CHLORIDE 5 MG PO TABS: 5.0000 mg | ORAL_TABLET | Freq: Two times a day (BID) | ORAL | Status: DC

## 2014-12-01 MED ORDER — SILDENAFIL CITRATE 100 MG PO TABS: 100.0000 mg | ORAL_TABLET | Freq: Every day | ORAL | Status: DC | PRN

## 2014-12-01 MED ORDER — CLONAZEPAM 1 MG PO TABS: ORAL_TABLET | ORAL | Status: DC

## 2014-12-01 MED ORDER — BENAZEPRIL HCL 10 MG PO TABS: 10.0000 mg | ORAL_TABLET | Freq: Every day | ORAL | Status: DC

## 2014-12-01 NOTE — Addendum Note (Signed)
Addended by: Ilean China on: 12/01/2014 04:46 PM   Modules accepted: Orders

## 2014-12-01 NOTE — Addendum Note (Signed)
Addended by: Ilean China on: 12/01/2014 01:46 PM   Modules accepted: Orders

## 2014-12-01 NOTE — Addendum Note (Signed)
Addended by: Ilean China on: 12/01/2014 01:34 PM   Modules accepted: Orders

## 2014-12-01 NOTE — Progress Notes (Signed)
Subjective:    Patient ID: Jonathan Love, male    DOB: 24-Aug-1946, 69 y.o.   MRN: [id number]  HPI 69 year old male comes in today for follow up on chronic medical conditions including hypertension, hyperlipidemia, and GERD. He is needing medication refills as well.  He complains today of some lower mid abdominal pain that frequently awakens him in the morning at 5:00 at which time he gets up into his bladder then goes back to bed. When he gets up later in the morning he typically has loose stools. We looked at his medicines and he does take Mylanta as well as a probiotic which might contribute to the diarrhea. He has a history of diverticular disease he states that he eats fiber but with loose stools I would like to add of bulk agent such as Citrucel an effort to firm up the stools. Also asked him to stop Mylanta which might contribute.  In addition he complains of some water that he hears in his right ear. He attributed this to wax. He occasionally has some dizziness.  In addition he still has anxiety in spite of taking clonazepam twice a day. He expressed some interest in adding an antidepressant and revealed reviewed his history as to what he's been on before which includes Prozac, Zoloft, and Celexa  Patient Active Problem List   Diagnosis Date Noted  . Abdominal pain, left lower quadrant 02/19/2014  . Erectile dysfunction 02/19/2014  . OE (otitis externa) 08/12/2013  . Need for Tdap vaccination 08/12/2013  . Encounter for annual health examination 08/12/2013  . GERD (gastroesophageal reflux disease)   . Migraine   . CTS (carpal tunnel syndrome)   . 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 OAB (overactive bladder)   . Anxiety   . HTN (hypertension) 04/09/2013  . HLD (hyperlipidemia) 04/09/2013  . Prediabetes 04/09/2013  . Gout 04/09/2013   Outpatient Encounter Prescriptions as of 12/01/2014  Medication Sig  . allopurinol (ZYLOPRIM) 300 MG tablet Take 1 tablet (300 mg total) by mouth daily.   Marland Kitchen alum & mag hydroxide-simeth (MAALOX/MYLANTA) 200-200-20 MG/5ML suspension Take 15 mLs by mouth as needed for indigestion or heartburn.  Marland Kitchen atenolol (TENORMIN) 100 MG tablet Take 1 tablet (100 mg total) by mouth daily.  Marland Kitchen atorvastatin (LIPITOR) 40 MG tablet Take 1 tablet (40 mg total) by mouth daily.  . benazepril (LOTENSIN) 10 MG tablet Take 1 tablet (10 mg total) by mouth daily.  . clonazePAM (KLONOPIN) 1 MG tablet TAKE ONE TABLET BY MOUTH TWICE DAILY AS NEEDED  . oxybutynin (DITROPAN) 5 MG tablet Take 1 tablet (5 mg total) by mouth 2 (two) times daily.  . ranitidine (ZANTAC) 150 MG capsule Take 150 mg by mouth every evening.  . sildenafil (VIAGRA) 100 MG tablet Take 1 tablet (100 mg total) by mouth daily as needed for erectile dysfunction.     Review of Systems  Gastrointestinal: Positive for abdominal pain.  Psychiatric/Behavioral: The patient is nervous/anxious.        Objective:   Physical Exam  Constitutional: He is oriented to person, place, and time.  HENT:  Left Ear: External ear normal.  Left tympanic membrane with dullness  Cardiovascular: Normal rate and regular rhythm.   Pulmonary/Chest: Effort normal.  Abdominal: Soft. Bowel sounds are normal. He exhibits no mass. There is no tenderness. There is no rebound and no guarding.  Neurological: He is alert and oriented to person, place, and time.   BP 158/91 mmHg  Pulse 68  Temp(Src) 98 F (36.7 C) (Oral)  Ht 5\' 6"  (1.676 m)  Wt 189 lb (85.73 kg)  BMI 30.52 kg/m2        Assessment & Plan:  1. Essential hypertension   2. HLD (hyperlipidemia)   3. Gastroesophageal reflux disease, esophagitis presence not specified   4. OAB (overactive bladder)  - oxybutynin (DITROPAN) 5 MG tablet; Take 1 tablet (5 mg total) by mouth 2 (two) times daily.  Dispense: 90 tablet; Refill: 1  5. Anxiety  - clonazePAM (KLONOPIN) 1 MG tablet; TAKE ONE TABLET BY MOUTH TWICE DAILY AS NEEDED  Dispense: 60 tablet; Refill: 5  6.  Drug-induced erectile dysfunction  - sildenafil (VIAGRA) 100 MG tablet; Take 1 tablet (100 mg total) by mouth daily as needed for erectile dysfunction.  Dispense: 3 tablet; Refill: 11  7. Serous effusion right ear Suggested a trial of OTC Flonase.  Wardell Honour MD

## 2014-12-02 ENCOUNTER — Telehealth: Payer: Self-pay | Admitting: Family Medicine

## 2014-12-02 NOTE — Telephone Encounter (Signed)
Can we send in a rx for sildenfal citrate for patient

## 2014-12-04 MED ORDER — SILDENAFIL CITRATE 20 MG PO TABS: ORAL_TABLET | ORAL | Status: DC

## 2014-12-04 NOTE — Telephone Encounter (Signed)
Patient aware rx sent to pharmacy.  

## 2015-03-03 ENCOUNTER — Encounter: Payer: Self-pay | Admitting: Family Medicine

## 2015-03-03 ENCOUNTER — Ambulatory Visit (INDEPENDENT_AMBULATORY_CARE_PROVIDER_SITE_OTHER): Payer: Medicare Other | Admitting: Family Medicine

## 2015-03-03 VITALS — BP 192/82 | HR 74 | Temp 97.0°F | Ht 66.0 in | Wt 185.4 lb

## 2015-03-03 DIAGNOSIS — Z9189 Other specified personal risk factors, not elsewhere classified: Secondary | ICD-10-CM | POA: Diagnosis not present

## 2015-03-03 DIAGNOSIS — I10 Essential (primary) hypertension: Secondary | ICD-10-CM | POA: Diagnosis not present

## 2015-03-03 DIAGNOSIS — Z77098 Contact with and (suspected) exposure to other hazardous, chiefly nonmedicinal, chemicals: Secondary | ICD-10-CM

## 2015-03-03 MED ORDER — VENLAFAXINE HCL ER 75 MG PO CP24: 75.0000 mg | ORAL_CAPSULE | Freq: Every day | ORAL | Status: DC

## 2015-03-03 MED ORDER — AMLODIPINE BESYLATE 5 MG PO TABS: 5.0000 mg | ORAL_TABLET | Freq: Every day | ORAL | Status: DC

## 2015-03-03 NOTE — Progress Notes (Signed)
Subjective:    Patient ID: Jonathan Love, male    DOB: 1945/12/02, 69 y.o.   MRN: [id number]  HPI 69 year old male who is here to follow-up depression, hypertension, erectile dysfunction. Regarding the depression he has been on several antidepressants most recently Lexapro. This was discontinued by him because of nightmares. Klonopin seems to be helping as far as anxiety  Regarding blood pressure he has been taking atenolol and benazepril. Pressure today was 034 systolic that he attributes to anxiety. His pressures at home are more in the normal range. Since he does have a problem with erectile dysfunction I think we should get him off beta blocker try calcium channel blocker which may be more effective.  Since his last visit, he has been using Citrucel for diverticular disease and irritable bowel and this seems to have helped. He asked about a prescription for dicyclomine but since symptoms have improved I'm reluctant to add another pill to his regimen.  Patient Active Problem List   Diagnosis Date Noted  . Abdominal pain, left lower quadrant 02/19/2014  . Erectile dysfunction 02/19/2014  . OE (otitis externa) 08/12/2013  . Need for Tdap vaccination 08/12/2013  . Encounter for annual health examination 08/12/2013  . GERD (gastroesophageal reflux disease)   . Migraine   . CTS (carpal tunnel syndrome)   . 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 OAB (overactive bladder)   . Anxiety   . HTN (hypertension) 04/09/2013  . HLD (hyperlipidemia) 04/09/2013  . Prediabetes 04/09/2013  . Gout 04/09/2013   Outpatient Encounter Prescriptions as of 03/03/2015  Medication Sig  . allopurinol (ZYLOPRIM) 300 MG tablet Take 1 tablet (300 mg total) by mouth daily.  Marland Kitchen alum & mag hydroxide-simeth (MAALOX/MYLANTA) 200-200-20 MG/5ML suspension Take 15 mLs by mouth as needed for indigestion or heartburn.  Marland Kitchen atenolol (TENORMIN) 100 MG tablet Take 1 tablet (100 mg total) by mouth daily.  Marland Kitchen atorvastatin (LIPITOR) 40 MG  tablet Take 1 tablet (40 mg total) by mouth daily.  . benazepril (LOTENSIN) 10 MG tablet Take 1 tablet (10 mg total) by mouth daily.  . clonazePAM (KLONOPIN) 1 MG tablet TAKE ONE TABLET BY MOUTH TWICE DAILY AS NEEDED  . escitalopram (LEXAPRO) 10 MG tablet Take 1 tablet (10 mg total) by mouth daily. (Patient not taking: Reported on 03/03/2015)  . oxybutynin (DITROPAN) 5 MG tablet Take 1 tablet (5 mg total) by mouth 2 (two) times daily.  . ranitidine (ZANTAC) 150 MG capsule Take 150 mg by mouth every evening.  . sildenafil (REVATIO) 20 MG tablet 1 tab prn  . sildenafil (VIAGRA) 100 MG tablet Take 1 tablet (100 mg total) by mouth daily as needed for erectile dysfunction.   No facility-administered encounter medications on file as of 03/03/2015.      Review of Systems  Constitutional: Negative.   HENT: Negative.   Respiratory: Negative.   Cardiovascular: Negative.   Gastrointestinal: Positive for abdominal pain.  Neurological: Negative.   Psychiatric/Behavioral: The patient is nervous/anxious.        Objective:   Physical Exam  Constitutional: He is oriented to person, place, and time. He appears well-developed and well-nourished.  HENT:  Head: Normocephalic.  Cardiovascular: Normal rate and regular rhythm.   Pulmonary/Chest: Effort normal and breath sounds normal.  Musculoskeletal: Normal range of motion.  Neurological: He is alert and oriented to person, place, and time.  Psychiatric: He has a normal mood and affect.          Assessment & Plan:  1. Essential hypertension Blood pressure has not been well controlled today and had his last visit and since he has erectile dysfunction I would like to taper him off beta blocker and begin amlodipine. He will monitor pressure at home and Ms. we may need to make some adjustments  2. H/O agent Orange exposure He relates his flashbacks and PTSD 2. Mom experience, not necessarily agent Orange although that exposure is always out there.  Since he did not tolerate cyclic or SSRI antidepressants will try Effexor X are 75 mg.  Wardell Honour MD

## 2015-03-24 DIAGNOSIS — H2513 Age-related nuclear cataract, bilateral: Secondary | ICD-10-CM | POA: Diagnosis not present

## 2015-03-24 DIAGNOSIS — H40033 Anatomical narrow angle, bilateral: Secondary | ICD-10-CM | POA: Diagnosis not present

## 2015-04-26 ENCOUNTER — Other Ambulatory Visit: Payer: Self-pay

## 2015-05-05 ENCOUNTER — Encounter: Payer: Self-pay | Admitting: *Deleted

## 2015-06-03 ENCOUNTER — Ambulatory Visit: Payer: Medicare Other | Admitting: Family Medicine

## 2015-06-10 ENCOUNTER — Encounter: Payer: Self-pay | Admitting: Family Medicine

## 2015-06-10 ENCOUNTER — Other Ambulatory Visit: Payer: Self-pay | Admitting: Family Medicine

## 2015-06-10 ENCOUNTER — Ambulatory Visit (INDEPENDENT_AMBULATORY_CARE_PROVIDER_SITE_OTHER): Payer: Medicare Other | Admitting: Family Medicine

## 2015-06-10 VITALS — BP 158/77 | HR 81 | Temp 97.8°F | Ht 66.0 in | Wt 181.0 lb

## 2015-06-10 DIAGNOSIS — N3281 Overactive bladder: Secondary | ICD-10-CM | POA: Diagnosis not present

## 2015-06-10 DIAGNOSIS — K219 Gastro-esophageal reflux disease without esophagitis: Secondary | ICD-10-CM

## 2015-06-10 DIAGNOSIS — I10 Essential (primary) hypertension: Secondary | ICD-10-CM

## 2015-06-10 DIAGNOSIS — F419 Anxiety disorder, unspecified: Secondary | ICD-10-CM

## 2015-06-10 DIAGNOSIS — R6882 Decreased libido: Secondary | ICD-10-CM

## 2015-06-10 DIAGNOSIS — N522 Drug-induced erectile dysfunction: Secondary | ICD-10-CM | POA: Diagnosis not present

## 2015-06-10 MED ORDER — BENAZEPRIL HCL 20 MG PO TABS: 20.0000 mg | ORAL_TABLET | Freq: Every day | ORAL | Status: DC

## 2015-06-10 MED ORDER — CLONAZEPAM 1 MG PO TABS: ORAL_TABLET | ORAL | Status: DC

## 2015-06-10 MED ORDER — VENLAFAXINE HCL ER 75 MG PO CP24: 75.0000 mg | ORAL_CAPSULE | Freq: Every day | ORAL | Status: DC

## 2015-06-10 MED ORDER — CYCLOBENZAPRINE HCL 10 MG PO TABS: 10.0000 mg | ORAL_TABLET | Freq: Every day | ORAL | Status: DC

## 2015-06-10 MED ORDER — OXYBUTYNIN CHLORIDE 5 MG PO TABS: 5.0000 mg | ORAL_TABLET | Freq: Two times a day (BID) | ORAL | Status: DC

## 2015-06-10 MED ORDER — SILDENAFIL CITRATE 20 MG PO TABS: ORAL_TABLET | ORAL | Status: DC

## 2015-06-10 MED ORDER — AMLODIPINE BESYLATE 5 MG PO TABS: 5.0000 mg | ORAL_TABLET | Freq: Every day | ORAL | Status: DC

## 2015-06-10 NOTE — Patient Instructions (Signed)
Use Biofreeze for neck pain

## 2015-06-10 NOTE — Progress Notes (Signed)
Subjective:    Patient ID: Jonathan Love, male    DOB: 08-May-1946, 69 y.o.   MRN: [id number]  HPI 69 year old gentleman with hypertension, hyperlipidemia, PTSD, and erectile dysfunction. At his last visit I introduced Effexor and it has helped his PTSD flashbacks and reaming but has decreased his libido. I tried to explain that all medicines have some positive in some negative effects but he sees it is all black or white. This is true with all medicines including his blood pressure medicine. At his last visit I asked tapered the beta blocker in hopes that it would help his ED and begin amlodipine but amlodipine previously he tells me now, caused some reflux symptoms. He does take Maalox and ranitidine and has not had any recent reflux  Patient Active Problem List   Diagnosis Date Noted  . Abdominal pain, left lower quadrant 02/19/2014  . Erectile dysfunction 02/19/2014  . OE (otitis externa) 08/12/2013  . Need for Tdap vaccination 08/12/2013  . Encounter for annual health examination 08/12/2013  . GERD (gastroesophageal reflux disease)   . Migraine   . CTS (carpal tunnel syndrome)   . 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 OAB (overactive bladder)   . Anxiety   . HTN (hypertension) 04/09/2013  . HLD (hyperlipidemia) 04/09/2013  . Prediabetes 04/09/2013  . Gout 04/09/2013   Outpatient Encounter Prescriptions as of 06/10/2015  Medication Sig  . allopurinol (ZYLOPRIM) 300 MG tablet Take 1 tablet (300 mg total) by mouth daily.  Marland Kitchen alum & mag hydroxide-simeth (MAALOX/MYLANTA) 200-200-20 MG/5ML suspension Take 15 mLs by mouth as needed for indigestion or heartburn.  Marland Kitchen atorvastatin (LIPITOR) 40 MG tablet Take 1 tablet (40 mg total) by mouth daily.  . benazepril (LOTENSIN) 10 MG tablet Take 1 tablet (10 mg total) by mouth daily.  . clonazePAM (KLONOPIN) 1 MG tablet TAKE ONE TABLET BY MOUTH TWICE DAILY AS NEEDED  . oxybutynin (DITROPAN) 5 MG tablet Take 1 tablet (5 mg total) by mouth 2 (two) times  daily.  . ranitidine (ZANTAC) 150 MG capsule Take 150 mg by mouth every evening.  . sildenafil (REVATIO) 20 MG tablet 1 tab prn  . venlafaxine XR (EFFEXOR XR) 75 MG 24 hr capsule Take 1 capsule (75 mg total) by mouth daily with breakfast.  . [DISCONTINUED] amLODipine (NORVASC) 5 MG tablet Take 1 tablet (5 mg total) by mouth daily.  . [DISCONTINUED] atenolol (TENORMIN) 100 MG tablet Take 1 tablet (100 mg total) by mouth daily.  . [DISCONTINUED] escitalopram (LEXAPRO) 10 MG tablet Take 1 tablet (10 mg total) by mouth daily.  . [DISCONTINUED] sildenafil (VIAGRA) 100 MG tablet Take 1 tablet (100 mg total) by mouth daily as needed for erectile dysfunction.   No facility-administered encounter medications on file as of 06/10/2015.      Review of Systems  Constitutional: Negative.   HENT: Negative.   Respiratory: Negative.   Cardiovascular: Negative.   Endocrine: Negative.   Genitourinary: Negative.   Neurological: Negative.   Psychiatric/Behavioral: The patient is nervous/anxious.        Objective:   Physical Exam  Constitutional: He is oriented to person, place, and time. He appears well-developed and well-nourished.  Cardiovascular: Normal rate and regular rhythm.   Pulmonary/Chest: Effort normal and breath sounds normal.  Abdominal: Soft. Bowel sounds are normal.  Neurological: He is alert and oriented to person, place, and time.  Psychiatric: He has a normal mood and affect.    BP 158/77 mmHg  Pulse 81  Temp(Src)  97.8 F (36.6 C) (Oral)  Ht 5\' 6"  (1.676 m)  Wt 181 lb (82.101 kg)  BMI 29.23 kg/m2        Assessment & Plan:  1. OAB (overactive bladder)  - oxybutynin (DITROPAN) 5 MG tablet; Take 1 tablet (5 mg total) by mouth 2 (two) times daily.  Dispense: 90 tablet; Refill: 1  2. Anxiety  - clonazePAM (KLONOPIN) 1 MG tablet; TAKE ONE TABLET BY MOUTH TWICE DAILY AS NEEDED  Dispense: 60 tablet; Refill: 5  3. Decreased libido This may well be due to the Effexor but he  has tried just about everything else for PTSD. All of it seems to have some side effect - Testosterone,Free and Total; Future  4. Gastroesophageal reflux disease, esophagitis presence not specified Continue with amlodipine and use of Zantac and Maalox for symptoms  5. Essential hypertension Same as above  6. Drug-induced erectile dysfunction Continue with generic sildenafil.  Wardell Honour MD

## 2015-06-11 ENCOUNTER — Other Ambulatory Visit (INDEPENDENT_AMBULATORY_CARE_PROVIDER_SITE_OTHER): Payer: Medicare Other

## 2015-06-11 DIAGNOSIS — R6882 Decreased libido: Secondary | ICD-10-CM

## 2015-06-14 LAB — TESTOSTERONE,FREE AND TOTAL
Testosterone, Free: 12.5 pg/mL (ref 6.6–18.1)
Testosterone: 509 ng/dL (ref 348–1197)

## 2015-07-08 ENCOUNTER — Encounter: Payer: Self-pay | Admitting: *Deleted

## 2015-08-10 ENCOUNTER — Telehealth: Payer: Self-pay | Admitting: Family Medicine

## 2015-08-25 ENCOUNTER — Ambulatory Visit (INDEPENDENT_AMBULATORY_CARE_PROVIDER_SITE_OTHER): Payer: Medicare Other

## 2015-08-25 ENCOUNTER — Ambulatory Visit (INDEPENDENT_AMBULATORY_CARE_PROVIDER_SITE_OTHER): Payer: Medicare Other | Admitting: Family Medicine

## 2015-08-25 ENCOUNTER — Encounter: Payer: Self-pay | Admitting: Family Medicine

## 2015-08-25 VITALS — BP 153/81 | HR 86 | Temp 97.4°F | Ht 66.0 in | Wt 181.8 lb

## 2015-08-25 DIAGNOSIS — M542 Cervicalgia: Secondary | ICD-10-CM

## 2015-08-25 DIAGNOSIS — G8929 Other chronic pain: Secondary | ICD-10-CM | POA: Diagnosis not present

## 2015-08-25 MED ORDER — CYCLOBENZAPRINE HCL 10 MG PO TABS: 10.0000 mg | ORAL_TABLET | Freq: Every day | ORAL | Status: DC

## 2015-08-25 NOTE — Progress Notes (Signed)
   Subjective:    Patient ID: Jonathan Love, male    DOB: [birth year], 69 y.o.   MRN: [id number]  HPI 69 year old gentleman with neck pain area the pain seems to be in the left side of his neck more than right. He denies any radicular symptoms. He has tried multiple modalities to treat including heat and TENS massage different pillows. He does get some relief with Flexeril and clonazepam which she takes twice a day.  Patient Active Problem List   Diagnosis Date Noted  . Abdominal pain, left lower quadrant 02/19/2014  . Erectile dysfunction 02/19/2014  . OE (otitis externa) 08/12/2013  . Need for Tdap vaccination 08/12/2013  . Encounter for annual health examination 08/12/2013  . GERD (gastroesophageal reflux disease)   . Migraine   . CTS (carpal tunnel syndrome)   . 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 OAB (overactive bladder)   . Anxiety   . HTN (hypertension) 04/09/2013  . HLD (hyperlipidemia) 04/09/2013  . Prediabetes 04/09/2013  . Gout 04/09/2013   Outpatient Encounter Prescriptions as of 08/25/2015  Medication Sig  . allopurinol (ZYLOPRIM) 300 MG tablet TAKE ONE TABLET BY MOUTH ONCE DAILY  . alum & mag hydroxide-simeth (MAALOX/MYLANTA) 200-200-20 MG/5ML suspension Take 15 mLs by mouth as needed for indigestion or heartburn.  Marland Kitchen amLODipine (NORVASC) 5 MG tablet Take 1 tablet (5 mg total) by mouth daily.  Marland Kitchen atorvastatin (LIPITOR) 40 MG tablet Take 1 tablet (40 mg total) by mouth daily.  . benazepril (LOTENSIN) 20 MG tablet Take 1 tablet (20 mg total) by mouth daily.  . clonazePAM (KLONOPIN) 1 MG tablet TAKE ONE TABLET BY MOUTH TWICE DAILY AS NEEDED  . cyclobenzaprine (FLEXERIL) 10 MG tablet Take 1 tablet (10 mg total) by mouth at bedtime.  Marland Kitchen oxybutynin (DITROPAN) 5 MG tablet Take 1 tablet (5 mg total) by mouth 2 (two) times daily.  . ranitidine (ZANTAC) 150 MG capsule Take 150 mg by mouth every evening.  . sildenafil (REVATIO) 20 MG tablet 1 tab prn  . venlafaxine XR (EFFEXOR XR) 75 MG  24 hr capsule Take 1 capsule (75 mg total) by mouth daily with breakfast.   No facility-administered encounter medications on file as of 08/25/2015.      Review of Systems  Constitutional: Negative.   Respiratory: Negative.   Musculoskeletal: Positive for neck pain.  Neurological: Negative.        Objective:   Physical Exam  Constitutional: He appears well-developed and well-nourished.  Musculoskeletal:  Neck: Normal range of motion There is some tightness in the left trapezius muscle. Strength and reflexes in upper extremities are symmetric. C-spine x-ray shows loss of at about C7 8. There are also some loose fragments posteriorly that appear to have broken off from the spinous processes.          Assessment & Plan:  1. Neck pain of over 3 months duration Given his x-ray findings I would like him to see neurosurgeon. I cannot explain the loose bodies. I also do not understand why he is not having radicular symptoms giving loss of between lower cervical vertebrae. Refill Flexeril to take at nighttime. Continue with heat massage.  Wardell Honour MD  Notice: Thisdictation was prepared with Dragon dictation along with smaller phrase technology. Any transcriptional errors that result from this process are unintentional and may not be corrected upon review - DG Cervical Spine Complete; Future - Ambulatory referral to Neurosurgery

## 2015-09-06 DIAGNOSIS — M542 Cervicalgia: Secondary | ICD-10-CM | POA: Diagnosis not present

## 2015-09-07 ENCOUNTER — Other Ambulatory Visit: Payer: Self-pay | Admitting: Family Medicine

## 2015-10-18 DIAGNOSIS — Z6827 Body mass index (BMI) 27.0-27.9, adult: Secondary | ICD-10-CM | POA: Diagnosis not present

## 2015-10-18 DIAGNOSIS — M542 Cervicalgia: Secondary | ICD-10-CM | POA: Diagnosis not present

## 2015-10-18 DIAGNOSIS — I10 Essential (primary) hypertension: Secondary | ICD-10-CM | POA: Diagnosis not present

## 2015-11-29 DIAGNOSIS — I10 Essential (primary) hypertension: Secondary | ICD-10-CM | POA: Diagnosis not present

## 2015-11-29 DIAGNOSIS — M542 Cervicalgia: Secondary | ICD-10-CM | POA: Diagnosis not present

## 2015-12-15 ENCOUNTER — Ambulatory Visit: Payer: Medicare Other | Admitting: Family Medicine

## 2015-12-16 ENCOUNTER — Ambulatory Visit (INDEPENDENT_AMBULATORY_CARE_PROVIDER_SITE_OTHER): Payer: Medicare Other | Admitting: Family Medicine

## 2015-12-16 ENCOUNTER — Encounter: Payer: Self-pay | Admitting: Family Medicine

## 2015-12-16 VITALS — BP 139/77 | HR 87 | Temp 97.8°F | Ht 66.0 in | Wt 182.0 lb

## 2015-12-16 DIAGNOSIS — F419 Anxiety disorder, unspecified: Secondary | ICD-10-CM | POA: Diagnosis not present

## 2015-12-16 DIAGNOSIS — E785 Hyperlipidemia, unspecified: Secondary | ICD-10-CM | POA: Diagnosis not present

## 2015-12-16 MED ORDER — SILDENAFIL CITRATE 20 MG PO TABS: ORAL_TABLET | ORAL | Status: DC

## 2015-12-16 MED ORDER — AMLODIPINE BESYLATE 5 MG PO TABS: 5.0000 mg | ORAL_TABLET | Freq: Every day | ORAL | Status: DC

## 2015-12-16 MED ORDER — BENAZEPRIL HCL 20 MG PO TABS: 20.0000 mg | ORAL_TABLET | Freq: Every day | ORAL | Status: DC

## 2015-12-16 MED ORDER — VENLAFAXINE HCL ER 75 MG PO CP24: 75.0000 mg | ORAL_CAPSULE | Freq: Every day | ORAL | Status: DC

## 2015-12-16 MED ORDER — CLONAZEPAM 1 MG PO TABS: ORAL_TABLET | ORAL | Status: DC

## 2015-12-16 MED ORDER — ATORVASTATIN CALCIUM 40 MG PO TABS: 40.0000 mg | ORAL_TABLET | Freq: Every day | ORAL | Status: DC

## 2015-12-16 NOTE — Progress Notes (Signed)
   Subjective:    Patient ID: Jonathan Love, male    DOB: 11-Oct-1946, 70 y.o.   MRN: [id number]  HPI 70 year old gentleman here to follow-up hypertension hyperlipidemia prediabetes. He typically will bring a list of symptoms and questions with him in today it's mostly centers around his neck pain where he has degenerative changes and spurring. He was seen by the neurosurgeon who put him on anti-inflammatory area this helps Jonathan Love Tylenol arthritis strength and we opted for the latter. He also complains of some congestion in his ears. He has been monitoring his blood pressure given his prediabetes. Generally sugars are between 90 and 110.  Patient Active Problem List   Diagnosis Date Noted  . Abdominal pain, left lower quadrant 02/19/2014  . Erectile dysfunction 02/19/2014  . OE (otitis externa) 08/12/2013  . Need for Tdap vaccination 08/12/2013  . Encounter for annual health examination 08/12/2013  . GERD (gastroesophageal reflux disease)   . Migraine   . CTS (carpal tunnel syndrome)   . 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 OAB (overactive bladder)   . Anxiety   . HTN (hypertension) 04/09/2013  . HLD (hyperlipidemia) 04/09/2013  . Prediabetes 04/09/2013  . Gout 04/09/2013   Outpatient Encounter Prescriptions as of 12/16/2015  Medication Sig  . allopurinol (ZYLOPRIM) 300 MG tablet TAKE ONE TABLET BY MOUTH ONCE DAILY  . alum & mag hydroxide-simeth (MAALOX/MYLANTA) 200-200-20 MG/5ML suspension Take 15 mLs by mouth as needed for indigestion or heartburn.  Marland Kitchen amLODipine (NORVASC) 5 MG tablet Take 1 tablet (5 mg total) by mouth daily.  Marland Kitchen atorvastatin (LIPITOR) 40 MG tablet Take 1 tablet (40 mg total) by mouth daily.  . benazepril (LOTENSIN) 20 MG tablet Take 1 tablet (20 mg total) by mouth daily.  . clonazePAM (KLONOPIN) 1 MG tablet TAKE ONE TABLET BY MOUTH TWICE DAILY AS NEEDED  . nabumetone (RELAFEN) 500 MG tablet Take 1 tablet by mouth daily.  . ranitidine (ZANTAC) 150 MG capsule Take 150 mg  by mouth every evening.  . sildenafil (REVATIO) 20 MG tablet 1 tab prn  . venlafaxine XR (EFFEXOR XR) 75 MG 24 hr capsule Take 1 capsule (75 mg total) by mouth daily with breakfast.  . [DISCONTINUED] cyclobenzaprine (FLEXERIL) 10 MG tablet Take 1 tablet (10 mg total) by mouth at bedtime.  . [DISCONTINUED] oxybutynin (DITROPAN) 5 MG tablet Take 1 tablet (5 mg total) by mouth 2 (two) times daily.   No facility-administered encounter medications on file as of 12/16/2015.      Review of Systems  Constitutional: Negative.   HENT: Positive for hearing loss.   Respiratory: Negative.   Cardiovascular: Negative.   Neurological: Negative.   Psychiatric/Behavioral: Negative.        Objective:   Physical Exam  Constitutional: He is oriented to person, place, and time. He appears well-developed and well-nourished.  HENT:  Both EACs are occluded by cerumen  Cardiovascular: Normal rate and regular rhythm.   Pulmonary/Chest: Effort normal and breath sounds normal.  Neurological: He is alert and oriented to person, place, and time.          Assessment & Plan:

## 2015-12-17 LAB — LIPID PANEL
CHOLESTEROL TOTAL: 179 mg/dL (ref 100–199)
Chol/HDL Ratio: 3.7 ratio units (ref 0.0–5.0)
HDL: 49 mg/dL (ref >39)
LDL Calculated: 90 mg/dL (ref 0–99)
TRIGLYCERIDES: 199 mg/dL — AB (ref 0–149)
VLDL CHOLESTEROL CAL: 40 mg/dL (ref 5–40)

## 2015-12-17 LAB — HEPATIC FUNCTION PANEL
ALK PHOS: 101 IU/L (ref 39–117)
ALT: 15 IU/L (ref 0–44)
AST: 17 IU/L (ref 0–40)
Albumin: 4.5 g/dL (ref 3.6–4.8)
BILIRUBIN TOTAL: 0.4 mg/dL (ref 0.0–1.2)
Bilirubin, Direct: 0.11 mg/dL (ref 0.00–0.40)
Total Protein: 7.1 g/dL (ref 6.0–8.5)

## 2016-01-17 ENCOUNTER — Other Ambulatory Visit: Payer: Self-pay | Admitting: Family Medicine

## 2016-04-04 DIAGNOSIS — B070 Plantar wart: Secondary | ICD-10-CM | POA: Diagnosis not present

## 2016-04-04 DIAGNOSIS — M79672 Pain in left foot: Secondary | ICD-10-CM | POA: Diagnosis not present

## 2016-04-20 DIAGNOSIS — M79671 Pain in right foot: Secondary | ICD-10-CM | POA: Diagnosis not present

## 2016-04-20 DIAGNOSIS — B070 Plantar wart: Secondary | ICD-10-CM | POA: Diagnosis not present

## 2016-05-29 DIAGNOSIS — H40033 Anatomical narrow angle, bilateral: Secondary | ICD-10-CM | POA: Diagnosis not present

## 2016-05-29 DIAGNOSIS — H2513 Age-related nuclear cataract, bilateral: Secondary | ICD-10-CM | POA: Diagnosis not present

## 2016-06-15 ENCOUNTER — Ambulatory Visit (INDEPENDENT_AMBULATORY_CARE_PROVIDER_SITE_OTHER): Payer: Medicare Other | Admitting: Family Medicine

## 2016-06-15 ENCOUNTER — Other Ambulatory Visit: Payer: Self-pay | Admitting: Family Medicine

## 2016-06-15 ENCOUNTER — Encounter: Payer: Self-pay | Admitting: Family Medicine

## 2016-06-15 DIAGNOSIS — E785 Hyperlipidemia, unspecified: Secondary | ICD-10-CM | POA: Diagnosis not present

## 2016-06-15 DIAGNOSIS — F419 Anxiety disorder, unspecified: Secondary | ICD-10-CM

## 2016-06-15 MED ORDER — RANITIDINE HCL 150 MG PO CAPS: 150.0000 mg | ORAL_CAPSULE | Freq: Every evening | ORAL | 1 refills | Status: DC

## 2016-06-15 MED ORDER — BENAZEPRIL HCL 20 MG PO TABS: 20.0000 mg | ORAL_TABLET | Freq: Every day | ORAL | 1 refills | Status: DC

## 2016-06-15 MED ORDER — ATORVASTATIN CALCIUM 40 MG PO TABS: 40.0000 mg | ORAL_TABLET | Freq: Every day | ORAL | 1 refills | Status: DC

## 2016-06-15 MED ORDER — AMLODIPINE BESYLATE 10 MG PO TABS: 10.0000 mg | ORAL_TABLET | Freq: Every day | ORAL | 1 refills | Status: DC

## 2016-06-15 MED ORDER — VENLAFAXINE HCL ER 75 MG PO CP24: 75.0000 mg | ORAL_CAPSULE | Freq: Every day | ORAL | 1 refills | Status: DC

## 2016-06-15 MED ORDER — CLONAZEPAM 1 MG PO TABS: ORAL_TABLET | ORAL | 5 refills | Status: DC

## 2016-06-15 MED ORDER — NABUMETONE 500 MG PO TABS
500.0000 mg | ORAL_TABLET | Freq: Two times a day (BID) | ORAL | 1 refills | Status: DC
Start: 2016-06-15 — End: 2016-12-19

## 2016-06-15 NOTE — Progress Notes (Signed)
Subjective:    Patient ID: Jonathan Love, male    DOB: [birth year], 70 y.o.   MRN: [id number]  HPI 70 yr old gentleman who has chronic neck pain , hypertension and hyperlipidemia. He brings in a list of symptoms and doctors he's seen since last visit here he had plantar warts removed from his foot also concerned about taking Relafen for his neck pain. He has only been taking it once a day and using Aleve and Tylenol other times of the day. Blood pressures have not been well controlled in here today 163/98 is too high.  Patient Active Problem List   Diagnosis Date Noted  . Abdominal pain, left lower quadrant 02/19/2014  . Erectile dysfunction 02/19/2014  . OE (otitis externa) 08/12/2013  . Need for Tdap vaccination 08/12/2013  . Encounter for annual health examination 08/12/2013  . GERD (gastroesophageal reflux disease)   . Migraine   . CTS (carpal tunnel syndrome)   . 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 OAB (overactive bladder)   . Anxiety   . HTN (hypertension) 04/09/2013  . HLD (hyperlipidemia) 04/09/2013  . Prediabetes 04/09/2013  . Gout 04/09/2013   Outpatient Encounter Prescriptions as of 06/15/2016  Medication Sig  . allopurinol (ZYLOPRIM) 300 MG tablet TAKE ONE TABLET BY MOUTH ONCE DAILY  . alum & mag hydroxide-simeth (MAALOX/MYLANTA) 200-200-20 MG/5ML suspension Take 15 mLs by mouth as needed for indigestion or heartburn.  Marland Kitchen amLODipine (NORVASC) 5 MG tablet Take 1 tablet (5 mg total) by mouth daily.  Marland Kitchen atorvastatin (LIPITOR) 40 MG tablet Take 1 tablet (40 mg total) by mouth daily.  . benazepril (LOTENSIN) 20 MG tablet Take 1 tablet (20 mg total) by mouth daily.  . clonazePAM (KLONOPIN) 1 MG tablet TAKE ONE TABLET BY MOUTH TWICE DAILY AS NEEDED  . nabumetone (RELAFEN) 500 MG tablet Take 1 tablet by mouth daily.  . ranitidine (ZANTAC) 150 MG capsule Take 150 mg by mouth every evening.  . sildenafil (REVATIO) 20 MG tablet 1 tab prn  . venlafaxine XR (EFFEXOR XR) 75 MG 24 hr  capsule Take 1 capsule (75 mg total) by mouth daily with breakfast.  . [DISCONTINUED] allopurinol (ZYLOPRIM) 300 MG tablet TAKE ONE TABLET BY MOUTH ONCE DAILY   No facility-administered encounter medications on file as of 06/15/2016.       Review of Systems  Constitutional: Negative.   Respiratory: Negative.   Cardiovascular: Negative.   Genitourinary: Negative.   Neurological: Negative.        Objective:   Physical Exam  Constitutional: He is oriented to person, place, and time. He appears well-developed and well-nourished.  HENT:  Head: Normocephalic.  Cardiovascular: Normal rate, regular rhythm, normal heart sounds and intact distal pulses.   Pulmonary/Chest: Effort normal and breath sounds normal.  Abdominal: Soft.  Musculoskeletal:  Patient has trigger finger in right hand third finger. Nodules palpated of the distal flexor crease and injected with a small amount of Depo-Medrol  Neurological: He is alert and oriented to person, place, and time.  Psychiatric: He has a normal mood and affect. His behavior is normal.   BP (!) 163/98 (BP Location: Left Arm, Patient Position: Sitting, Cuff Size: Normal)   Pulse 87   Temp 98.4 F (36.9 C) (Oral)   Ht 5\' 6"  (1.676 m)   Wt 182 lb 6.4 oz (82.7 kg)   BMI 29.44 kg/m         Assessment & Plan:  1. HLD (hyperlipidemia) Lipids were at goal when  last checked 6 months ago - atorvastatin (LIPITOR) 40 MG tablet; Take 1 tablet (40 mg total) by mouth daily.  Dispense: 90 tablet; Refill: 1  2. Anxiety Meza Pam is effective continue to take twice a day - clonazePAM (KLONOPIN) 1 MG tablet; TAKE ONE TABLET BY MOUTH TWICE DAILY AS NEEDED  Dispense: 60 tablet; Refill: 5  3.Blood Pressure Increase amlodipine to 10 mg a day. Cautioned about edema and swelling. Continue with benazepril as before a 20 mg.  Wardell Honour MD

## 2016-06-16 ENCOUNTER — Other Ambulatory Visit: Payer: Self-pay | Admitting: *Deleted

## 2016-06-16 NOTE — Telephone Encounter (Signed)
Looks like he was here yesterday, I think 2 were filled but not viagra? Was Klonopin called in

## 2016-08-15 ENCOUNTER — Ambulatory Visit: Payer: Medicare Other | Admitting: Family Medicine

## 2016-08-15 DIAGNOSIS — H43393 Other vitreous opacities, bilateral: Secondary | ICD-10-CM | POA: Diagnosis not present

## 2016-08-24 ENCOUNTER — Encounter (INDEPENDENT_AMBULATORY_CARE_PROVIDER_SITE_OTHER): Payer: Medicare Other | Admitting: Ophthalmology

## 2016-08-24 DIAGNOSIS — H33302 Unspecified retinal break, left eye: Secondary | ICD-10-CM

## 2016-08-24 DIAGNOSIS — I10 Essential (primary) hypertension: Secondary | ICD-10-CM | POA: Diagnosis not present

## 2016-08-24 DIAGNOSIS — H43813 Vitreous degeneration, bilateral: Secondary | ICD-10-CM

## 2016-08-24 DIAGNOSIS — H4312 Vitreous hemorrhage, left eye: Secondary | ICD-10-CM | POA: Diagnosis not present

## 2016-08-24 DIAGNOSIS — H35033 Hypertensive retinopathy, bilateral: Secondary | ICD-10-CM | POA: Diagnosis not present

## 2016-08-24 DIAGNOSIS — D3131 Benign neoplasm of right choroid: Secondary | ICD-10-CM

## 2016-08-24 DIAGNOSIS — H353131 Nonexudative age-related macular degeneration, bilateral, early dry stage: Secondary | ICD-10-CM

## 2016-08-24 HISTORY — PX: EYE SURGERY: SHX253

## 2016-09-04 ENCOUNTER — Ambulatory Visit (INDEPENDENT_AMBULATORY_CARE_PROVIDER_SITE_OTHER): Payer: Medicare Other | Admitting: Ophthalmology

## 2016-09-04 DIAGNOSIS — H33302 Unspecified retinal break, left eye: Secondary | ICD-10-CM

## 2016-09-11 ENCOUNTER — Other Ambulatory Visit: Payer: Self-pay | Admitting: *Deleted

## 2016-09-11 DIAGNOSIS — E7800 Pure hypercholesterolemia, unspecified: Secondary | ICD-10-CM

## 2016-09-11 DIAGNOSIS — F419 Anxiety disorder, unspecified: Secondary | ICD-10-CM

## 2016-09-11 MED ORDER — AMLODIPINE BESYLATE 10 MG PO TABS: 10.0000 mg | ORAL_TABLET | Freq: Every day | ORAL | 1 refills | Status: DC

## 2016-09-11 MED ORDER — VENLAFAXINE HCL ER 75 MG PO CP24: 75.0000 mg | ORAL_CAPSULE | Freq: Every day | ORAL | 1 refills | Status: DC

## 2016-09-11 MED ORDER — BENAZEPRIL HCL 20 MG PO TABS: 20.0000 mg | ORAL_TABLET | Freq: Every day | ORAL | 1 refills | Status: DC

## 2016-09-11 MED ORDER — RANITIDINE HCL 150 MG PO CAPS: 150.0000 mg | ORAL_CAPSULE | Freq: Every evening | ORAL | 1 refills | Status: DC

## 2016-09-11 MED ORDER — ALLOPURINOL 300 MG PO TABS: 300.0000 mg | ORAL_TABLET | Freq: Every day | ORAL | 1 refills | Status: DC

## 2016-09-11 MED ORDER — ATORVASTATIN CALCIUM 40 MG PO TABS: 40.0000 mg | ORAL_TABLET | Freq: Every day | ORAL | 1 refills | Status: DC

## 2016-09-11 NOTE — Telephone Encounter (Signed)
Clonazepam - [facility] pt =- [facility] is coverage

## 2016-09-13 ENCOUNTER — Other Ambulatory Visit: Payer: Self-pay | Admitting: *Deleted

## 2016-09-13 IMAGING — CR DG CERVICAL SPINE COMPLETE 4+V
5 series · 5 of 5 positions shown · non-contrast
Comparison: None in PACs

CLINICAL DATA: Three months of cervical pain

EXAM:
CERVICAL SPINE - COMPLETE 4+ VIEW

[view not recorded (1 of 5)]
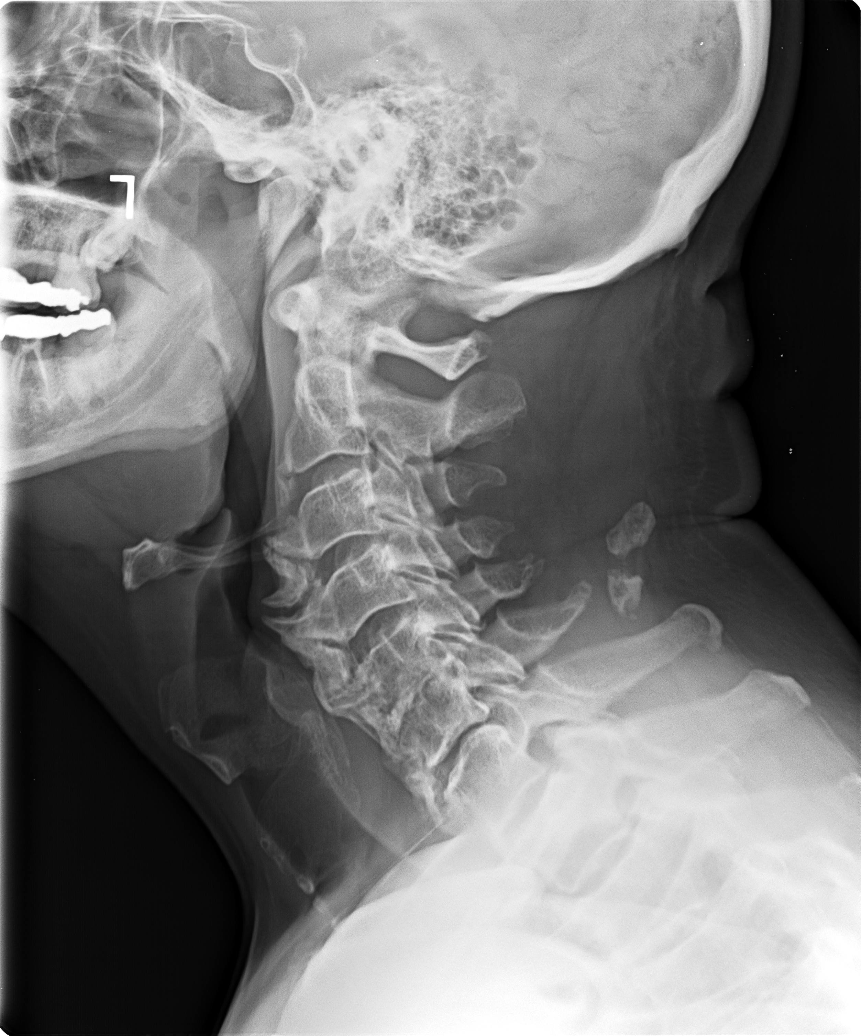

[view not recorded (2 of 5)]
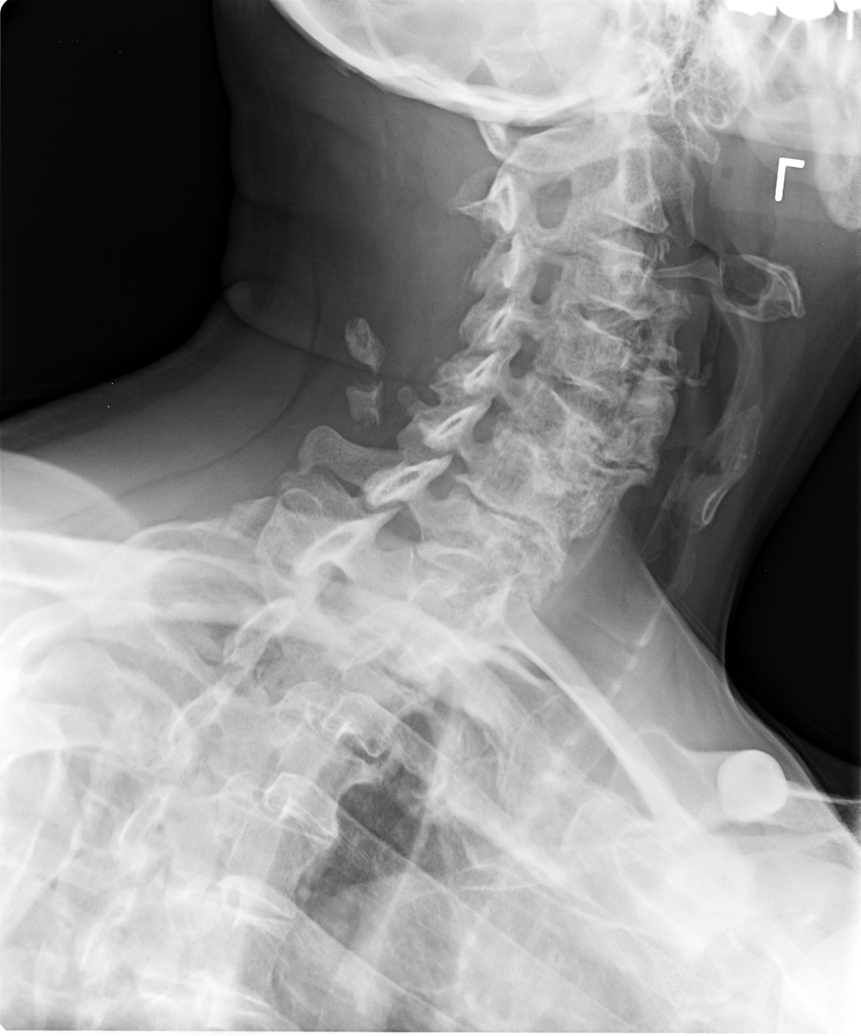

[view not recorded (3 of 5)]
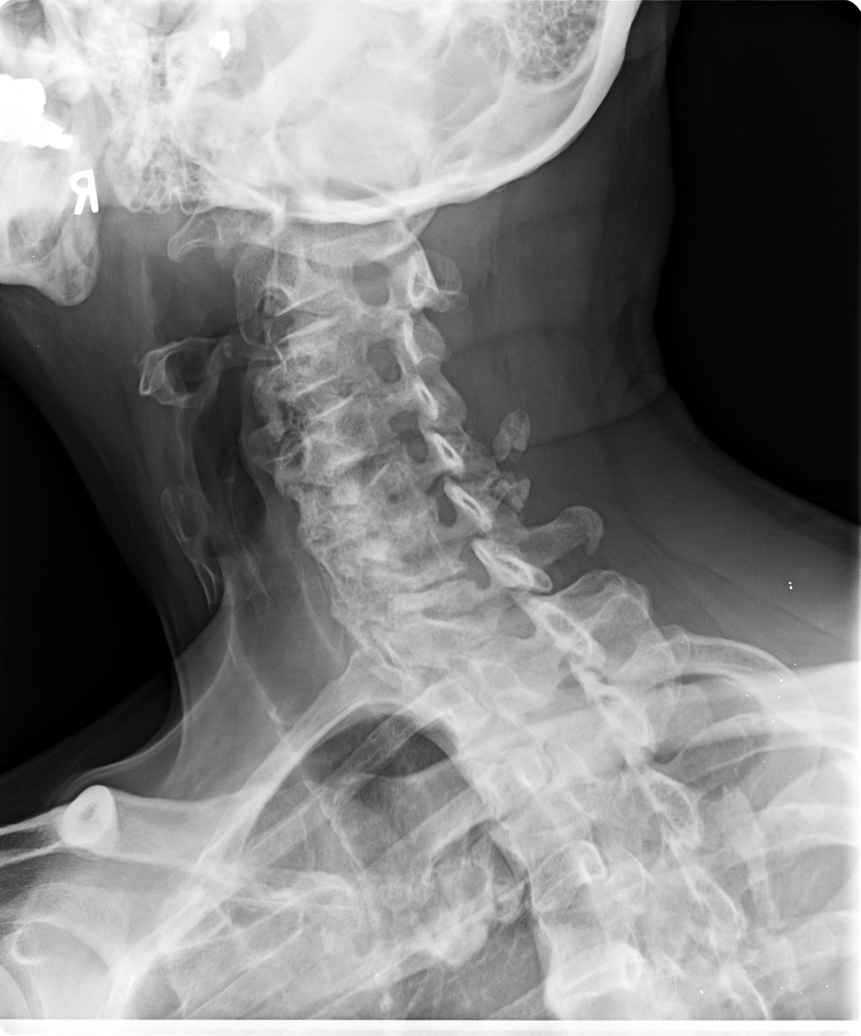

[view not recorded (4 of 5)]
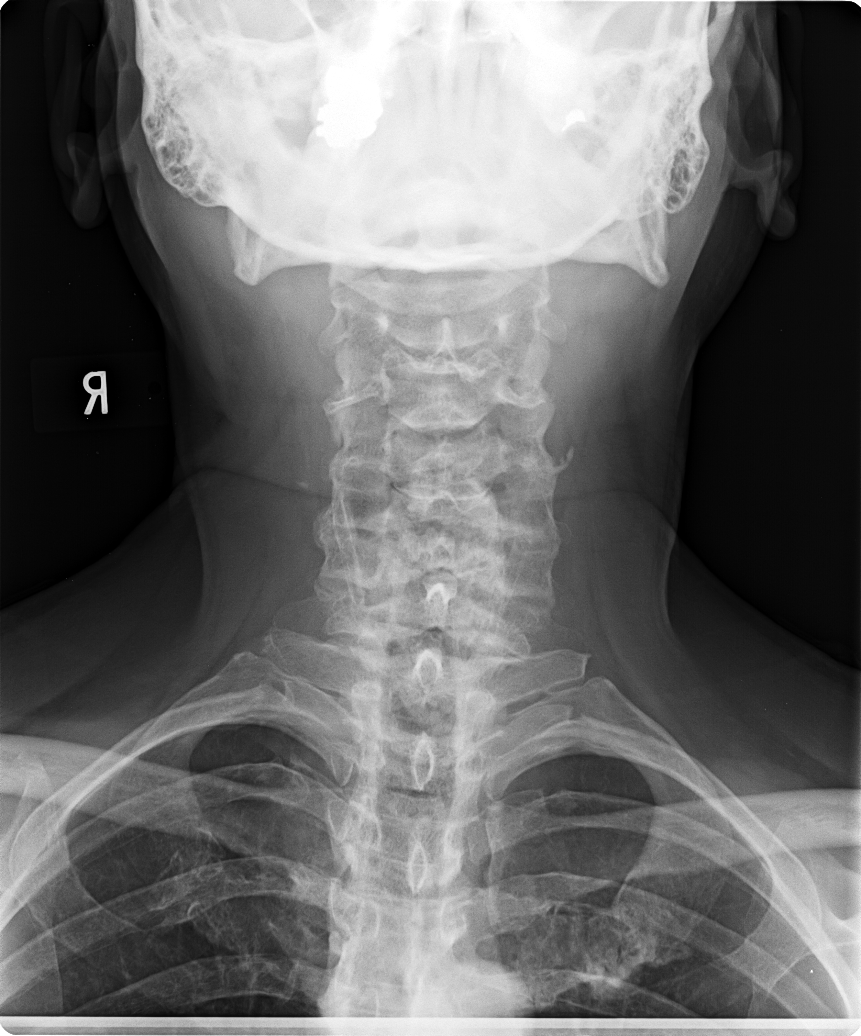

[view not recorded (5 of 5)]
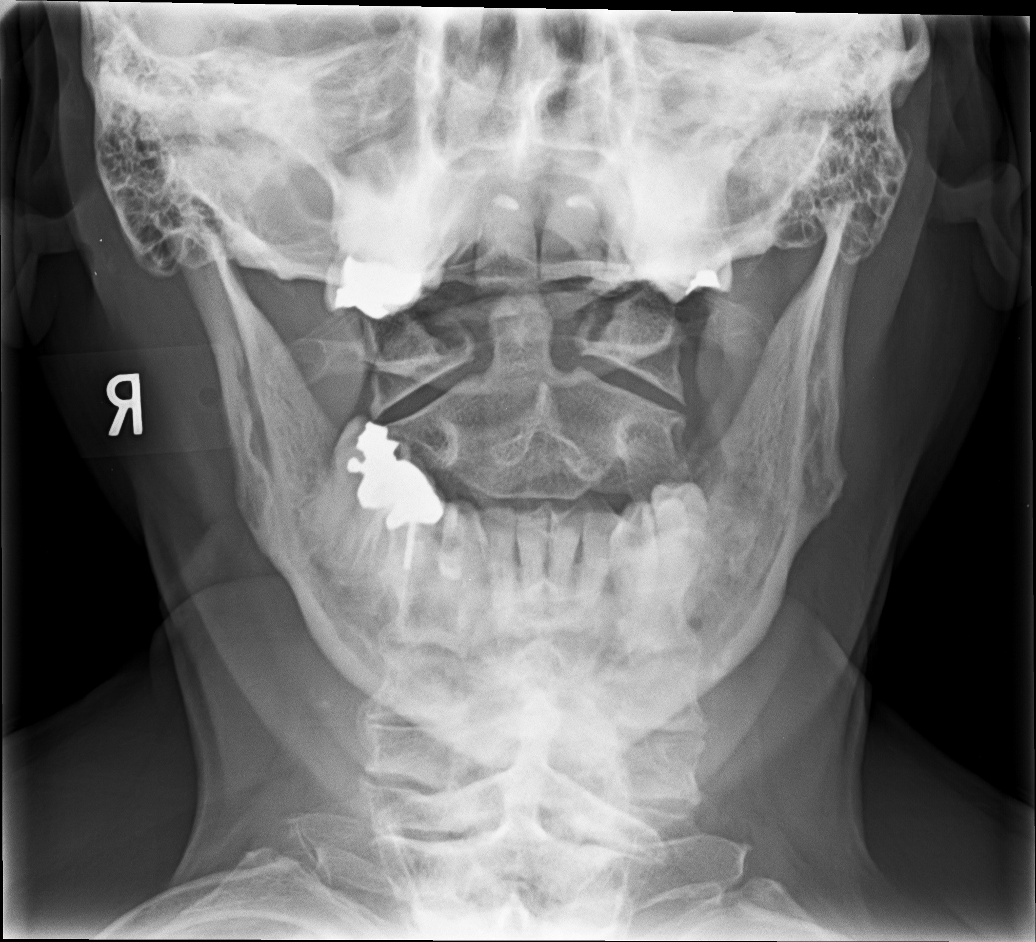

[5 of 5 positions shown; findings below may reference images not displayed]

FINDINGS: The cervical vertebral bodies are preserved in height. There is
moderate disc space narrowing at C5-6. There are large anterior
near-bridging osteophytes from C3 through C7 with smaller near
bridging osteophytes at C2-3. There is no perched facet or spinous
process abnormality. The oblique views reveal no high-grade bony
encroachment upon the neural foramina. The odontoid is intact. The
prevertebral soft tissue spaces are normal. There is calcification
of portions of the nuchal ligament.
IMPRESSION: There is moderate degenerative disc space narrowing at C5-6. Large
anterior bridging osteophytes are noted from C3 inferiorly. There is
no compression fracture.

If there are radicular symptoms, further evaluation with MRI would
be useful.

## 2016-09-13 MED ORDER — AMLODIPINE BESYLATE 10 MG PO TABS: 10.0000 mg | ORAL_TABLET | Freq: Every day | ORAL | 1 refills | Status: DC

## 2016-09-13 MED ORDER — VENLAFAXINE HCL ER 75 MG PO CP24: 75.0000 mg | ORAL_CAPSULE | Freq: Every day | ORAL | 1 refills | Status: DC

## 2016-09-13 MED ORDER — BENAZEPRIL HCL 20 MG PO TABS: 20.0000 mg | ORAL_TABLET | Freq: Every day | ORAL | 1 refills | Status: DC

## 2016-09-13 MED ORDER — RANITIDINE HCL 150 MG PO CAPS
150.0000 mg | ORAL_CAPSULE | Freq: Every evening | ORAL | 1 refills | Status: DC
Start: 2016-09-13 — End: 2016-12-19

## 2016-12-19 ENCOUNTER — Ambulatory Visit (INDEPENDENT_AMBULATORY_CARE_PROVIDER_SITE_OTHER): Payer: Medicare Other | Admitting: Family Medicine

## 2016-12-19 ENCOUNTER — Encounter: Payer: Self-pay | Admitting: Family Medicine

## 2016-12-19 VITALS — BP 121/75 | HR 61 | Temp 98.7°F | Ht 66.0 in | Wt 184.0 lb

## 2016-12-19 DIAGNOSIS — F419 Anxiety disorder, unspecified: Secondary | ICD-10-CM | POA: Diagnosis not present

## 2016-12-19 DIAGNOSIS — M65331 Trigger finger, right middle finger: Secondary | ICD-10-CM | POA: Diagnosis not present

## 2016-12-19 DIAGNOSIS — E7800 Pure hypercholesterolemia, unspecified: Secondary | ICD-10-CM

## 2016-12-19 DIAGNOSIS — I10 Essential (primary) hypertension: Secondary | ICD-10-CM

## 2016-12-19 DIAGNOSIS — K219 Gastro-esophageal reflux disease without esophagitis: Secondary | ICD-10-CM

## 2016-12-19 MED ORDER — CLONAZEPAM 1 MG PO TABS: ORAL_TABLET | ORAL | 5 refills | Status: DC

## 2016-12-19 MED ORDER — ALLOPURINOL 300 MG PO TABS: 300.0000 mg | ORAL_TABLET | Freq: Every day | ORAL | 1 refills | Status: DC

## 2016-12-19 MED ORDER — RANITIDINE HCL 150 MG PO TABS: 150.0000 mg | ORAL_TABLET | Freq: Every day | ORAL | 1 refills | Status: DC

## 2016-12-19 MED ORDER — ATORVASTATIN CALCIUM 40 MG PO TABS: 40.0000 mg | ORAL_TABLET | Freq: Every day | ORAL | 1 refills | Status: DC

## 2016-12-19 MED ORDER — VENLAFAXINE HCL ER 75 MG PO CP24: 75.0000 mg | ORAL_CAPSULE | Freq: Every day | ORAL | 1 refills | Status: DC

## 2016-12-19 MED ORDER — SILDENAFIL CITRATE 20 MG PO TABS: 20.0000 mg | ORAL_TABLET | ORAL | 4 refills | Status: DC | PRN

## 2016-12-19 MED ORDER — NABUMETONE 500 MG PO TABS: 500.0000 mg | ORAL_TABLET | Freq: Every day | ORAL | 1 refills | Status: DC

## 2016-12-19 MED ORDER — AMLODIPINE BESYLATE 10 MG PO TABS: 10.0000 mg | ORAL_TABLET | Freq: Every day | ORAL | 1 refills | Status: DC

## 2016-12-19 MED ORDER — BENAZEPRIL HCL 20 MG PO TABS: 20.0000 mg | ORAL_TABLET | Freq: Every day | ORAL | 1 refills | Status: DC

## 2016-12-19 NOTE — Patient Instructions (Signed)
Medicare Annual Wellness Visit  [facility] and the medical providers at [facility] strive to bring you the best medical care.  In doing so we not only want to address your current medical conditions and concerns but also to detect new conditions early and prevent illness, disease and health-related problems.    Medicare offers a yearly Wellness Visit which allows our clinical staff to assess your need for preventative services including immunizations, lifestyle education, counseling to decrease risk of preventable diseases and screening for fall risk and other medical concerns.    This visit is provided free of charge (no copay) for all Medicare recipients. The clinical pharmacists at [facility] have begun to conduct these Wellness Visits which will also include a thorough review of all your medications.    As you primary medical provider recommend that you make an appointment for your Annual Wellness Visit if you have not done so already this year.  You may set up this appointment before you leave today or you may call back ([phone]) and schedule an appointment.  Please make sure when you call that you mention that you are scheduling your Annual Wellness Visit with the clinical pharmacist so that the appointment may be made for the proper length of time.     Continue current medications. Continue good therapeutic lifestyle changes which include good diet and exercise. Fall precautions discussed with patient. If an FOBT was given today- please return it to our front desk. If you are over 50 years old - you may need Prevnar 13 or the adult Pneumonia vaccine.  **Flu shots are available--- please call and schedule a FLU-CLINIC appointment**  After your visit with us today you will receive a survey in the mail or online from [employer] regarding your care with us. Please take a moment to fill this out. Your feedback is very  important to us as you can help us better understand your patient needs as well as improve your experience and satisfaction. WE CARE ABOUT YOU!!!    

## 2016-12-19 NOTE — Progress Notes (Signed)
Subjective:    Patient ID: Jonathan Love, male    DOB: 1945-10-31, 71 y.o.   MRN: [id number]  HPI Pt here for follow up and management of chronic medical problem which includes hyperlipidemia and hypertension. He is taking medication regularly. At last visit, amlodipine was increased. He has not had any problems with dependent edema. He has had some problems however with floaters in his left eye and has seen a retina specialist in [place]. He got some relief from his trigger finger injection. Symptoms recurred after about 4 months and he is asking for another injection today.  Patient Active Problem List   Diagnosis Date Noted  . Abdominal pain, left lower quadrant 02/19/2014  . Erectile dysfunction 02/19/2014  . OE (otitis externa) 08/12/2013  . Need for Tdap vaccination 08/12/2013  . Encounter for annual health examination 08/12/2013  . GERD (gastroesophageal reflux disease)   . Migraine   . CTS (carpal tunnel syndrome)   . 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 OAB (overactive bladder)   . Anxiety   . HTN (hypertension) 04/09/2013  . HLD (hyperlipidemia) 04/09/2013  . Prediabetes 04/09/2013  . Gout 04/09/2013   Outpatient Encounter Prescriptions as of 12/19/2016  Medication Sig  . allopurinol (ZYLOPRIM) 300 MG tablet Take 1 tablet (300 mg total) by mouth daily.  Marland Kitchen alum & mag hydroxide-simeth (MAALOX/MYLANTA) 200-200-20 MG/5ML suspension Take 15 mLs by mouth as needed for indigestion or heartburn.  Marland Kitchen amLODipine (NORVASC) 10 MG tablet Take 1 tablet (10 mg total) by mouth daily.  Marland Kitchen atorvastatin (LIPITOR) 40 MG tablet Take 1 tablet (40 mg total) by mouth daily.  . benazepril (LOTENSIN) 20 MG tablet Take 1 tablet (20 mg total) by mouth daily.  . clonazePAM (KLONOPIN) 1 MG tablet TAKE ONE TABLET BY MOUTH TWICE DAILY AS NEEDED  . nabumetone (RELAFEN) 500 MG tablet Take 1 tablet (500 mg total) by mouth 2 (two) times daily.  . ranitidine (ZANTAC) 150 MG tablet Take 150 mg by mouth at  bedtime.  . sildenafil (REVATIO) 20 MG tablet TAKE ONE TABLET BY MOUTH AS NEEDED  . venlafaxine XR (EFFEXOR XR) 75 MG 24 hr capsule Take 1 capsule (75 mg total) by mouth daily with breakfast.  . [DISCONTINUED] ranitidine (ZANTAC) 150 MG capsule Take 1 capsule (150 mg total) by mouth every evening.   No facility-administered encounter medications on file as of 12/19/2016.       Review of Systems  Constitutional: Negative.   HENT: Negative.   Eyes: Negative.   Respiratory: Negative.   Cardiovascular: Negative.   Gastrointestinal: Negative.   Endocrine: Negative.   Genitourinary: Negative.   Musculoskeletal: Positive for arthralgias (right middle finger pain).  Skin: Negative.   Allergic/Immunologic: Negative.   Neurological: Negative.   Hematological: Negative.   Psychiatric/Behavioral: Negative.        Objective:   Physical Exam  Constitutional: He is oriented to person, place, and time. He appears well-developed and well-nourished.  Cardiovascular: Normal rate, regular rhythm and normal heart sounds.   Pulmonary/Chest: Effort normal and breath sounds normal.  Musculoskeletal:  Small nodule proximal to right long finger was again injected with 2/10 mL of Depo-Medrol  Neurological: He is alert and oriented to person, place, and time.   BP 121/75 (BP Location: Left Arm)   Pulse 61   Temp 98.7 F (37.1 C) (Oral)   Ht _0  (1.676 m)   Wt 184 lb (83.5 kg)   BMI 29.70 kg/m  Assessment & Plan:  1. Pure hypercholesterolemia Lipids were at goal when last checked 1 year ago - Lipid panel - atorvastatin (LIPITOR) 40 MG tablet; Take 1 tablet (40 mg total) by mouth daily.  Dispense: 90 tablet; Refill: 1  2. Gastroesophageal reflux disease, esophagitis presence not specified Symptoms are controlled with ranitidine  3. Essential hypertension Blood pressure is good today. He takes amlodipine, 10 mg, and benazepril, 20 mg. - CMP14+EGFR  4. Anxiety Doing well on  current dose of Effexor and Klonopin - clonazePAM (KLONOPIN) 1 MG tablet; TAKE ONE TABLET BY MOUTH TWICE DAILY AS NEEDED  Dispense: 60 tablet; Refill: 5  Wardell Honour MD

## 2016-12-20 LAB — LIPID PANEL
CHOL/HDL RATIO: 3.5 (ref 0.0–5.0)
Cholesterol, Total: 176 mg/dL (ref 100–199)
HDL: 51 mg/dL (ref >39)
LDL CALC: 92 (ref 0–99)
TRIGLYCERIDES: 166 mg/dL — AB (ref 0–149)
VLDL Cholesterol Cal: 33 (ref 5–40)

## 2016-12-20 LAB — CMP14+EGFR
A/G RATIO: 2.2 (ref 1.2–2.2)
ALBUMIN: 4.7 g/dL (ref 3.6–4.8)
ALT: 17 IU/L (ref 0–44)
AST: 15 IU/L (ref 0–40)
Alkaline Phosphatase: 94 IU/L (ref 39–117)
BUN / CREAT RATIO: 15 (ref 10–24)
BUN: 16 mg/dL (ref 8–27)
Bilirubin Total: 0.5 mg/dL (ref 0.0–1.2)
CALCIUM: 9.5 mg/dL (ref 8.6–10.2)
CO2: 24 mmol/L (ref 18–29)
CREATININE: 1.04 mg/dL (ref 0.76–1.27)
Chloride: 99 mmol/L (ref 96–106)
GFR, EST AFRICAN AMERICAN: 84 (ref >59)
GFR, EST NON AFRICAN AMERICAN: 73 (ref >59)
GLOBULIN, TOTAL: 2.1 (ref 1.5–4.5)
Glucose: 91 mg/dL (ref 65–99)
POTASSIUM: 4.5 mmol/L (ref 3.5–5.2)
SODIUM: 139 mmol/L (ref 134–144)
TOTAL PROTEIN: 6.8 g/dL (ref 6.0–8.5)

## 2017-01-08 ENCOUNTER — Ambulatory Visit (INDEPENDENT_AMBULATORY_CARE_PROVIDER_SITE_OTHER): Payer: Medicare Other | Admitting: Ophthalmology

## 2017-01-18 ENCOUNTER — Ambulatory Visit (INDEPENDENT_AMBULATORY_CARE_PROVIDER_SITE_OTHER): Payer: Medicare Other | Admitting: Ophthalmology

## 2017-01-18 DIAGNOSIS — I10 Essential (primary) hypertension: Secondary | ICD-10-CM

## 2017-01-18 DIAGNOSIS — H43813 Vitreous degeneration, bilateral: Secondary | ICD-10-CM | POA: Diagnosis not present

## 2017-01-18 DIAGNOSIS — H2513 Age-related nuclear cataract, bilateral: Secondary | ICD-10-CM

## 2017-01-18 DIAGNOSIS — H33302 Unspecified retinal break, left eye: Secondary | ICD-10-CM | POA: Diagnosis not present

## 2017-01-18 DIAGNOSIS — D3131 Benign neoplasm of right choroid: Secondary | ICD-10-CM | POA: Diagnosis not present

## 2017-01-18 DIAGNOSIS — H35033 Hypertensive retinopathy, bilateral: Secondary | ICD-10-CM | POA: Diagnosis not present

## 2017-02-28 DIAGNOSIS — H2512 Age-related nuclear cataract, left eye: Secondary | ICD-10-CM | POA: Diagnosis not present

## 2017-02-28 DIAGNOSIS — H35033 Hypertensive retinopathy, bilateral: Secondary | ICD-10-CM | POA: Diagnosis not present

## 2017-02-28 DIAGNOSIS — H31012 Macula scars of posterior pole (postinflammatory) (post-traumatic), left eye: Secondary | ICD-10-CM | POA: Diagnosis not present

## 2017-02-28 DIAGNOSIS — H25013 Cortical age-related cataract, bilateral: Secondary | ICD-10-CM | POA: Diagnosis not present

## 2017-02-28 DIAGNOSIS — H2513 Age-related nuclear cataract, bilateral: Secondary | ICD-10-CM | POA: Diagnosis not present

## 2017-03-27 DIAGNOSIS — H2512 Age-related nuclear cataract, left eye: Secondary | ICD-10-CM | POA: Diagnosis not present

## 2017-03-27 DIAGNOSIS — H25012 Cortical age-related cataract, left eye: Secondary | ICD-10-CM | POA: Diagnosis not present

## 2017-04-03 DIAGNOSIS — H578 Other specified disorders of eye and adnexa: Secondary | ICD-10-CM | POA: Diagnosis not present

## 2017-04-17 DIAGNOSIS — H04123 Dry eye syndrome of bilateral lacrimal glands: Secondary | ICD-10-CM | POA: Diagnosis not present

## 2017-04-27 ENCOUNTER — Other Ambulatory Visit: Payer: Self-pay | Admitting: Family Medicine

## 2017-04-27 DIAGNOSIS — E7800 Pure hypercholesterolemia, unspecified: Secondary | ICD-10-CM

## 2017-06-18 ENCOUNTER — Ambulatory Visit: Payer: Medicare Other | Admitting: Family Medicine

## 2017-06-18 ENCOUNTER — Ambulatory Visit: Payer: Medicare Other | Admitting: Nurse Practitioner

## 2017-06-19 ENCOUNTER — Ambulatory Visit (INDEPENDENT_AMBULATORY_CARE_PROVIDER_SITE_OTHER): Payer: Medicare Other | Admitting: Family Medicine

## 2017-06-19 ENCOUNTER — Telehealth: Payer: Self-pay | Admitting: Family Medicine

## 2017-06-19 ENCOUNTER — Encounter: Payer: Self-pay | Admitting: Family Medicine

## 2017-06-19 VITALS — BP 129/77 | HR 96 | Temp 98.2°F | Ht 66.0 in | Wt 189.0 lb

## 2017-06-19 DIAGNOSIS — M65331 Trigger finger, right middle finger: Secondary | ICD-10-CM

## 2017-06-19 MED ORDER — METHYLPREDNISOLONE ACETATE 80 MG/ML IJ SUSP
40.0000 mg | Freq: Once | INTRAMUSCULAR | Status: AC
  Administered 2017-06-19: 40 mg via INTRAMUSCULAR

## 2017-06-19 NOTE — Progress Notes (Signed)
BP 129/77   Pulse 96   Temp 98.2 F (36.8 C) (Oral)   Ht 5\' 6"  (1.676 m)   Wt 189 lb (85.7 kg)   BMI 30.51 kg/m    Subjective:    Patient ID: Jonathan Love, male    DOB: 10/20/1946, 71 y.o.   MRN: [id number]  HPI: Jonathan Love is a 71 y.o. male presenting on 06/19/2017 for Pain and swelling in finger (3rd digit right hand, history of trigger finger, has cortisone injection about every 6 months)   HPI Trigger finger Pain and swelling in his middle finger on his right hand, he gets recurrent trigger finger and has had injections about every 6 months in there. He does have some a lot of pain with full extension and cannot fully flex that finger either because of the pain in it and the swelling that there. The injections have worked well until most recent one in usually lasts at least 6 months.  Relevant past medical, surgical, family and social history reviewed and updated as indicated. Interim medical history since our last visit reviewed. Allergies and medications reviewed and updated.  Review of Systems  Constitutional: Negative for chills and fever.  Respiratory: Negative for shortness of breath and wheezing.   Cardiovascular: Negative for chest pain and leg swelling.  Musculoskeletal: Positive for arthralgias. Negative for back pain and gait problem.  Skin: Negative for rash.  All other systems reviewed and are negative.   Per HPI unless specifically indicated above        Objective:    BP 129/77   Pulse 96   Temp 98.2 F (36.8 C) (Oral)   Ht 5\' 6"  (1.676 m)   Wt 189 lb (85.7 kg)   BMI 30.51 kg/m   Wt Readings from Last 3 Encounters:  06/19/17 189 lb (85.7 kg)  12/19/16 184 lb (83.5 kg)  06/15/16 182 lb 6.4 oz (82.7 kg)    Physical Exam  Constitutional: He is oriented to person, place, and time. He appears well-developed and well-nourished. No distress.  Eyes: Conjunctivae are normal. No scleral icterus.  Cardiovascular: Normal rate, regular rhythm,  normal heart sounds and intact distal pulses.   No murmur heard. Pulmonary/Chest: Effort normal and breath sounds normal. No respiratory distress. He has no wheezes.  Musculoskeletal:       Right hand: He exhibits decreased range of motion, tenderness and swelling. He exhibits normal capillary refill and no deformity. Normal sensation noted. Normal strength noted.       Hands: Neurological: He is alert and oriented to person, place, and time. Coordination normal.  Skin: Skin is warm and dry. No rash noted. He is not diaphoretic.  Psychiatric: He has a normal mood and affect. His behavior is normal.  Nursing note and vitals reviewed.   Trigger finger injection: Consent form signed. Risk factors of bleeding and infection discussed with patient and patient is agreeable towards injection. Patient prepped with Betadine. Lateral approach towards injection used. Injected 40 mg of Depo-Medrol and 1 mL of 2% lidocaine. Patient tolerated procedure well and no side effects from noted. Minimal to no bleeding. Simple bandage applied after.     Assessment & Plan:   Problem List Items Addressed This Visit    None    Visit Diagnoses    Trigger middle finger of right hand    -  Primary   Relevant Medications   methylPREDNISolone acetate (DEPO-MEDROL) injection 40 mg (Completed) (Start on 06/19/2017  4:00 PM)  Follow up plan: Return if symptoms worsen or fail to improve.  Counseling provided for all of the vaccine components No orders of the defined types were placed in this encounter.   Caryl Pina, MD [facility] 06/19/2017, 3:53 PM

## 2017-06-19 NOTE — Telephone Encounter (Signed)
Pt notified Dr Wendi Snipes does not do trigger point inj appt scheduled with Dr Warrick Parisian

## 2017-06-22 ENCOUNTER — Ambulatory Visit: Payer: Medicare Other | Admitting: Family Medicine

## 2017-06-27 ENCOUNTER — Ambulatory Visit (INDEPENDENT_AMBULATORY_CARE_PROVIDER_SITE_OTHER): Payer: Medicare Other | Admitting: Family Medicine

## 2017-06-27 ENCOUNTER — Encounter: Payer: Self-pay | Admitting: Family Medicine

## 2017-06-27 VITALS — BP 135/74 | HR 87 | Temp 97.6°F | Ht 66.0 in | Wt 187.0 lb

## 2017-06-27 DIAGNOSIS — E7800 Pure hypercholesterolemia, unspecified: Secondary | ICD-10-CM

## 2017-06-27 DIAGNOSIS — I10 Essential (primary) hypertension: Secondary | ICD-10-CM

## 2017-06-27 DIAGNOSIS — M65331 Trigger finger, right middle finger: Secondary | ICD-10-CM

## 2017-06-27 DIAGNOSIS — F419 Anxiety disorder, unspecified: Secondary | ICD-10-CM

## 2017-06-27 DIAGNOSIS — R7303 Prediabetes: Secondary | ICD-10-CM | POA: Diagnosis not present

## 2017-06-27 LAB — BAYER DCA HB A1C WAIVED: HB A1C (BAYER DCA - WAIVED): 6 % (ref <7.0)

## 2017-06-27 MED ORDER — ALLOPURINOL 300 MG PO TABS: 300.0000 mg | ORAL_TABLET | Freq: Every day | ORAL | 1 refills | Status: DC

## 2017-06-27 MED ORDER — RANITIDINE HCL 150 MG PO TABS: 150.0000 mg | ORAL_TABLET | Freq: Every day | ORAL | 1 refills | Status: DC

## 2017-06-27 MED ORDER — VENLAFAXINE HCL ER 75 MG PO CP24: ORAL_CAPSULE | ORAL | 0 refills | Status: DC

## 2017-06-27 MED ORDER — NABUMETONE 500 MG PO TABS: 500.0000 mg | ORAL_TABLET | Freq: Every day | ORAL | 1 refills | Status: DC

## 2017-06-27 MED ORDER — ATORVASTATIN CALCIUM 40 MG PO TABS: 40.0000 mg | ORAL_TABLET | Freq: Every day | ORAL | 1 refills | Status: DC

## 2017-06-27 MED ORDER — AMLODIPINE BESYLATE 10 MG PO TABS: 10.0000 mg | ORAL_TABLET | Freq: Every day | ORAL | 1 refills | Status: DC

## 2017-06-27 MED ORDER — CLONAZEPAM 1 MG PO TABS: ORAL_TABLET | ORAL | 5 refills | Status: DC

## 2017-06-27 MED ORDER — ATORVASTATIN CALCIUM 40 MG PO TABS
40.0000 mg | ORAL_TABLET | Freq: Every day | ORAL | 1 refills | Status: DC
Start: 2017-06-27 — End: 2017-06-27

## 2017-06-27 MED ORDER — BENAZEPRIL HCL 20 MG PO TABS: 20.0000 mg | ORAL_TABLET | Freq: Every day | ORAL | 1 refills | Status: DC

## 2017-06-27 NOTE — Progress Notes (Signed)
BP 135/74   Pulse 87   Temp 97.6 F (36.4 C) (Oral)   Ht '5\' 6"'  (1.676 m)   Wt 187 lb (84.8 kg)   BMI 30.18 kg/m    Subjective:    Patient ID: Jonathan Love, male    DOB: 02/03/1946, 71 y.o.   MRN: [id number]  HPI: Jonathan Love is a 71 y.o. male presenting on 06/27/2017 for Hyperlipidemia; Hypertension; and Diabetes (pre-diabetic)   HPI Hyperlipidemia Patient is coming in for recheck of his hyperlipidemia. The patient is currently taking Atorvastatin. They deny any issues with myalgias or history of liver damage from it. They deny any focal numbness or weakness or chest pain.   Hypertension Patient is currently on benazepril and amlodipine, and their blood pressure today is 135/74. Patient denies any lightheadedness or dizziness. Patient denies headaches, blurred vision, chest pains, shortness of breath, or weakness. Denies any side effects from medication and is content with current medication.   Prediabetes Patient comes in today for recheck of his diabetes. Patient has been currently taking diet controlled, we are just monitoring for now.. Patient is currently on an ACE inhibitor/ARB. Patient has not seen an ophthalmologist this year. Patient denies any issues with their feet.   Anxiety refill Patient is coming in for refill of his anxiety medication. He currently takes venlafaxine and clonazepam and has been pretty stable on that at the current dose for quite some time. He denies any major side effects from it. He uses the Klonopin as needed but mostly for anxiety in the nighttime.  Continued pain from trigger finger Patient has been having continued pain from trigger finger despite steroid injection last week. He would like to go see a hand specialist for this.   past medical, surgical, family and social history reviewed and updated as indicated. Interim medical history since our last visit reviewed. Allergies and medications reviewed and updated.  Review of Systems    Constitutional: Negative for chills and fever.  Eyes: Negative for discharge.  Respiratory: Negative for shortness of breath and wheezing.   Cardiovascular: Negative for chest pain and leg swelling.  Musculoskeletal: Positive for arthralgias. Negative for back pain and gait problem.  Skin: Negative for rash.  Neurological: Negative for dizziness, weakness, light-headedness and headaches.  Psychiatric/Behavioral: Negative for dysphoric mood, self-injury, sleep disturbance and suicidal ideas. The patient is nervous/anxious.   All other systems reviewed and are negative.   Per HPI unless specifically indicated above      Objective:    BP 135/74   Pulse 87   Temp 97.6 F (36.4 C) (Oral)   Ht '5\' 6"'  (1.676 m)   Wt 187 lb (84.8 kg)   BMI 30.18 kg/m   Wt Readings from Last 3 Encounters:  06/27/17 187 lb (84.8 kg)  06/19/17 189 lb (85.7 kg)  12/19/16 184 lb (83.5 kg)    Physical Exam  Constitutional: He is oriented to person, place, and time. He appears well-developed and well-nourished. No distress.  Eyes: Conjunctivae are normal. No scleral icterus.  Neck: Neck supple. No thyromegaly present.  Cardiovascular: Normal rate, regular rhythm, normal heart sounds and intact distal pulses.   No murmur heard. Pulmonary/Chest: Effort normal and breath sounds normal. No respiratory distress. He has no wheezes. He has no rales.  Musculoskeletal: Normal range of motion. He exhibits no edema.  Continued pain along the tendon that is consistent with trigger finger of the right middle finger of the right hand  Lymphadenopathy:  He has no cervical adenopathy.  Neurological: He is alert and oriented to person, place, and time. Coordination normal.  Skin: Skin is warm and dry. No rash noted. He is not diaphoretic.  Psychiatric: He has a normal mood and affect. His behavior is normal.  Nursing note and vitals reviewed.       Assessment & Plan:   Problem List Items Addressed This Visit       Cardiovascular and Mediastinum   HTN (hypertension) - Primary   Relevant Medications   amLODipine (NORVASC) 10 MG tablet   atorvastatin (LIPITOR) 40 MG tablet   benazepril (LOTENSIN) 20 MG tablet   Other Relevant Orders   CMP14+EGFR     Other   HLD (hyperlipidemia)   Relevant Medications   amLODipine (NORVASC) 10 MG tablet   atorvastatin (LIPITOR) 40 MG tablet   benazepril (LOTENSIN) 20 MG tablet   Other Relevant Orders   Lipid panel   Prediabetes   Relevant Orders   Bayer DCA Hb A1c Waived   CMP14+EGFR   Anxiety   Relevant Medications   clonazePAM (KLONOPIN) 1 MG tablet   venlafaxine XR (EFFEXOR-XR) 75 MG 24 hr capsule    Other Visit Diagnoses    Trigger middle finger of right hand       Relevant Orders   Ambulatory referral to Orthopedic Surgery       Follow up plan: Return in about 6 months (around 12/27/2017), or if symptoms worsen or fail to improve, for Recheck hypertension and cholesterol and anxiety.  Counseling provided for all of the vaccine components Orders Placed This Encounter  Procedures  . Bayer DCA Hb A1c Waived  . CMP14+EGFR  . Lipid panel  . Ambulatory referral to [facility] Dettinger, MD [facility] 06/27/2017, 4:33 PM

## 2017-06-28 LAB — CMP14+EGFR
ALK PHOS: 108 IU/L (ref 39–117)
ALT: 14 IU/L (ref 0–44)
AST: 17 IU/L (ref 0–40)
Albumin/Globulin Ratio: 1.7 (ref 1.2–2.2)
Albumin: 4.8 g/dL (ref 3.5–4.8)
BUN/Creatinine Ratio: 13 (ref 10–24)
BUN: 15 mg/dL (ref 8–27)
Bilirubin Total: 0.6 mg/dL (ref 0.0–1.2)
CALCIUM: 9.8 mg/dL (ref 8.6–10.2)
CO2: 25 mmol/L (ref 20–29)
CREATININE: 1.15 mg/dL (ref 0.76–1.27)
Chloride: 100 mmol/L (ref 96–106)
GFR calc Af Amer: 74 mL/min/{1.73_m2} (ref >59)
GFR, EST NON AFRICAN AMERICAN: 64 mL/min/{1.73_m2} (ref >59)
GLUCOSE: 97 mg/dL (ref 65–99)
Globulin, Total: 2.8 g/dL (ref 1.5–4.5)
Potassium: 4.7 mmol/L (ref 3.5–5.2)
Sodium: 142 mmol/L (ref 134–144)
Total Protein: 7.6 g/dL (ref 6.0–8.5)

## 2017-06-28 LAB — LIPID PANEL
CHOL/HDL RATIO: 3.5 ratio (ref 0.0–5.0)
CHOLESTEROL TOTAL: 184 mg/dL (ref 100–199)
HDL: 52 mg/dL (ref >39)
LDL CALC: 89 mg/dL (ref 0–99)
TRIGLYCERIDES: 213 mg/dL — AB (ref 0–149)
VLDL Cholesterol Cal: 43 mg/dL — ABNORMAL HIGH (ref 5–40)

## 2017-07-10 DIAGNOSIS — M65331 Trigger finger, right middle finger: Secondary | ICD-10-CM | POA: Diagnosis not present

## 2017-07-24 DIAGNOSIS — M65331 Trigger finger, right middle finger: Secondary | ICD-10-CM | POA: Diagnosis not present

## 2017-08-23 ENCOUNTER — Other Ambulatory Visit: Payer: Self-pay | Admitting: Family Medicine

## 2017-11-07 ENCOUNTER — Other Ambulatory Visit: Payer: Self-pay | Admitting: Family Medicine

## 2017-11-07 NOTE — Telephone Encounter (Signed)
Last seen 06/27/17

## 2017-12-28 ENCOUNTER — Ambulatory Visit: Payer: Medicare Other | Admitting: Family Medicine

## 2017-12-28 ENCOUNTER — Encounter: Payer: Self-pay | Admitting: Family Medicine

## 2017-12-28 VITALS — BP 126/76 | HR 72 | Temp 99.1°F | Ht 66.0 in | Wt 187.0 lb

## 2017-12-28 DIAGNOSIS — K219 Gastro-esophageal reflux disease without esophagitis: Secondary | ICD-10-CM

## 2017-12-28 DIAGNOSIS — F419 Anxiety disorder, unspecified: Secondary | ICD-10-CM | POA: Diagnosis not present

## 2017-12-28 DIAGNOSIS — R7303 Prediabetes: Secondary | ICD-10-CM

## 2017-12-28 DIAGNOSIS — E7800 Pure hypercholesterolemia, unspecified: Secondary | ICD-10-CM | POA: Diagnosis not present

## 2017-12-28 DIAGNOSIS — N529 Male erectile dysfunction, unspecified: Secondary | ICD-10-CM

## 2017-12-28 DIAGNOSIS — I10 Essential (primary) hypertension: Secondary | ICD-10-CM | POA: Diagnosis not present

## 2017-12-28 LAB — BAYER DCA HB A1C WAIVED: HB A1C: 5.6 % (ref <7.0)

## 2017-12-28 MED ORDER — VENLAFAXINE HCL ER 75 MG PO CP24: ORAL_CAPSULE | ORAL | 1 refills | Status: DC

## 2017-12-28 MED ORDER — ATORVASTATIN CALCIUM 40 MG PO TABS: 40.0000 mg | ORAL_TABLET | Freq: Every day | ORAL | 1 refills | Status: DC

## 2017-12-28 MED ORDER — AMLODIPINE BESYLATE 10 MG PO TABS: 10.0000 mg | ORAL_TABLET | Freq: Every day | ORAL | 1 refills | Status: DC

## 2017-12-28 MED ORDER — ALLOPURINOL 300 MG PO TABS: 300.0000 mg | ORAL_TABLET | Freq: Every day | ORAL | 1 refills | Status: DC

## 2017-12-28 MED ORDER — CLONAZEPAM 1 MG PO TABS
ORAL_TABLET | ORAL | 5 refills | Status: DC
Start: 2017-12-28 — End: 2018-07-03

## 2017-12-28 MED ORDER — NABUMETONE 500 MG PO TABS: 500.0000 mg | ORAL_TABLET | Freq: Every day | ORAL | 1 refills | Status: DC

## 2017-12-28 MED ORDER — SILDENAFIL CITRATE 20 MG PO TABS: 20.0000 mg | ORAL_TABLET | ORAL | 4 refills | Status: DC | PRN

## 2017-12-28 MED ORDER — RANITIDINE HCL 150 MG PO TABS: 150.0000 mg | ORAL_TABLET | Freq: Two times a day (BID) | ORAL | 3 refills | Status: DC

## 2017-12-28 MED ORDER — BENAZEPRIL HCL 20 MG PO TABS: 20.0000 mg | ORAL_TABLET | Freq: Every day | ORAL | 1 refills | Status: DC

## 2017-12-28 NOTE — Progress Notes (Signed)
BP 126/76   Pulse 72   Temp 99.1 F (37.3 C) (Oral)   Ht '5\' 6"'  (1.676 m)   Wt 187 lb (84.8 kg)   BMI 30.18 kg/m    Subjective:    Patient ID: Jonathan Love, male    DOB: Feb 11, 1946, 72 y.o.   MRN: [id number]  HPI: Jonathan Love is a 72 y.o. male presenting on 12/28/2017 for Hyperlipidemia; Hypertension; and Prediabetes   HPI Hypertension Patient is currently on amlodipine and benazepril, and their blood pressure today is 126/76. Patient denies any lightheadedness or dizziness. Patient denies headaches, blurred vision, chest pains, shortness of breath, or weakness. Denies any side effects from medication and is content with current medication.   Hyperlipidemia Patient is coming in for recheck of his hyperlipidemia. The patient is currently taking Lipitor. They deny any issues with myalgias or history of liver damage from it. They deny any focal numbness or weakness or chest pain.   Prediabetes Patient comes in today for recheck of his diabetes. Patient has been currently taking no medication. Patient is currently on an ACE inhibitor/ARB. Patient has not seen an ophthalmologist this year. Patient denies any issues with their feet.   GERD Patient is currently on ranitidine.  She has been having some burning and acid issues and has been taking nabumetone without food and it was recommended that she take it with food, she has used Zantac before and it has worked. She denies any blood in her stool or lightheadedness or dizziness.   Anxiety Patient is coming in for recheck on anxiety.  Patient has been on Effexor and Klonopin and just needs a refill for those and says that they have been working well for him and denies any suicidal ideations or thoughts of hurting himself. Depression screen [facility] 2/9 12/28/2017 06/27/2017 06/19/2017 12/19/2016 06/15/2016  Decreased Interest 0 0 0 0 1  Down, Depressed, Hopeless 1 0 0 1 1  PHQ - 2 Score 1 0 0 1 2  Altered sleeping - - - - 3  Tired, decreased  energy - - - - 1  Change in appetite - - - - 0  Feeling bad or failure about yourself  - - - - 0  Trouble concentrating - - - - 1  Moving slowly or fidgety/restless - - - - 0  Suicidal thoughts - - - - 0  PHQ-9 Score - - - - 7     Erectile dysfunction Patient has had sildenafil previously and wants to get them to fill again for erectile dysfunction.  He says he has trouble getting erection and completing with ejaculation some of the time and just uses it every now and then and has not had any issues with it previously.  Relevant past medical, surgical, family and social history reviewed and updated as indicated. Interim medical history since our last visit reviewed. Allergies and medications reviewed and updated.  Review of Systems  Constitutional: Negative for chills and fever.  Eyes: Negative for discharge.  Respiratory: Negative for shortness of breath and wheezing.   Cardiovascular: Negative for chest pain and leg swelling.  Gastrointestinal: Positive for abdominal pain. Negative for abdominal distention, constipation, diarrhea, nausea and vomiting.  Genitourinary: Negative for discharge and penile pain.  Musculoskeletal: Negative for back pain and gait problem.  Skin: Negative for rash.  Psychiatric/Behavioral: Negative for dysphoric mood, self-injury, sleep disturbance and suicidal ideas. The patient is nervous/anxious.   All other systems reviewed and are negative.   Per  HPI unless specifically indicated above   Allergies as of 12/28/2017      Reactions   Penicillins       Medication List        Accurate as of 12/28/17  1:19 PM. Always use your most recent med list.          allopurinol 300 MG tablet Commonly known as:  ZYLOPRIM Take 1 tablet (300 mg total) by mouth daily.   alum & mag hydroxide-simeth 200-200-20 MG/5ML suspension Commonly known as:  MAALOX/MYLANTA Take 15 mLs by mouth as needed for indigestion or heartburn.   amLODipine 10 MG tablet Commonly  known as:  NORVASC Take 1 tablet (10 mg total) by mouth daily.   atorvastatin 40 MG tablet Commonly known as:  LIPITOR Take 1 tablet (40 mg total) by mouth daily.   benazepril 20 MG tablet Commonly known as:  LOTENSIN Take 1 tablet (20 mg total) by mouth daily.   clonazePAM 1 MG tablet Commonly known as:  KLONOPIN TAKE ONE TABLET BY MOUTH TWICE DAILY AS NEEDED   nabumetone 500 MG tablet Commonly known as:  RELAFEN Take 1 tablet (500 mg total) by mouth daily.   ranitidine 150 MG tablet Commonly known as:  ZANTAC Take 1 tablet (150 mg total) by mouth 2 (two) times daily.   sildenafil 20 MG tablet Commonly known as:  REVATIO Take 1 tablet (20 mg total) by mouth as needed.   venlafaxine XR 75 MG 24 hr capsule Commonly known as:  EFFEXOR-XR TAKE 1 CAPSULE BY MOUTH  DAILY WITH BREAKFAST          Objective:    BP 126/76   Pulse 72   Temp 99.1 F (37.3 C) (Oral)   Ht '5\' 6"'  (1.676 m)   Wt 187 lb (84.8 kg)   BMI 30.18 kg/m   Wt Readings from Last 3 Encounters:  12/28/17 187 lb (84.8 kg)  06/27/17 187 lb (84.8 kg)  06/19/17 189 lb (85.7 kg)    Physical Exam  Constitutional: He is oriented to person, place, and time. He appears well-developed and well-nourished. No distress.  Eyes: Conjunctivae are normal. No scleral icterus.  Neck: Neck supple. No thyromegaly present.  Cardiovascular: Normal rate, regular rhythm, normal heart sounds and intact distal pulses.  No murmur heard. Pulmonary/Chest: Effort normal and breath sounds normal. No respiratory distress. He has no wheezes. He has no rales.  Abdominal: Soft. Bowel sounds are normal. He exhibits no distension. There is no tenderness. There is no rebound.  Musculoskeletal: Normal range of motion. He exhibits no edema.  Lymphadenopathy:    He has no cervical adenopathy.  Neurological: He is alert and oriented to person, place, and time. Coordination normal.  Skin: Skin is warm and dry. No rash noted. He is not  diaphoretic.  Psychiatric: His behavior is normal. Judgment normal. His mood appears anxious. He does not exhibit a depressed mood. He expresses no suicidal ideation. He expresses no suicidal plans.  Nursing note and vitals reviewed.       Assessment & Plan:   Problem List Items Addressed This Visit      Cardiovascular and Mediastinum   HTN (hypertension)   Relevant Medications   atorvastatin (LIPITOR) 40 MG tablet   amLODipine (NORVASC) 10 MG tablet   benazepril (LOTENSIN) 20 MG tablet   sildenafil (REVATIO) 20 MG tablet   Other Relevant Orders   CMP14+EGFR (Completed)     Digestive   GERD (gastroesophageal reflux disease)   Relevant  Medications   ranitidine (ZANTAC) 150 MG tablet     Genitourinary   Erectile dysfunction     Other   HLD (hyperlipidemia)   Relevant Medications   atorvastatin (LIPITOR) 40 MG tablet   amLODipine (NORVASC) 10 MG tablet   benazepril (LOTENSIN) 20 MG tablet   sildenafil (REVATIO) 20 MG tablet   Other Relevant Orders   Lipid panel (Completed)   Prediabetes - Primary   Relevant Orders   Bayer DCA Hb A1c Waived (Completed)   CMP14+EGFR (Completed)   Anxiety   Relevant Medications   venlafaxine XR (EFFEXOR-XR) 75 MG 24 hr capsule   clonazePAM (KLONOPIN) 1 MG tablet       Follow up plan: Return in about 3 months (around 03/30/2018), or if symptoms worsen or fail to improve, for Diabetes and hypertension.  Counseling provided for all of the vaccine components No orders of the defined types were placed in this encounter.   Caryl Pina, MD [facility] 12/28/2017, 1:19 PM

## 2017-12-29 LAB — LIPID PANEL
Chol/HDL Ratio: 4.1 ratio (ref 0.0–5.0)
Cholesterol, Total: 213 mg/dL — ABNORMAL HIGH (ref 100–199)
HDL: 52 mg/dL (ref >39)
LDL Calculated: 115 mg/dL — ABNORMAL HIGH (ref 0–99)
Triglycerides: 231 mg/dL — ABNORMAL HIGH (ref 0–149)
VLDL CHOLESTEROL CAL: 46 mg/dL — AB (ref 5–40)

## 2017-12-29 LAB — CMP14+EGFR
ALK PHOS: 102 IU/L (ref 39–117)
ALT: 21 IU/L (ref 0–44)
AST: 20 IU/L (ref 0–40)
Albumin/Globulin Ratio: 1.7 (ref 1.2–2.2)
Albumin: 4.7 g/dL (ref 3.5–4.8)
BILIRUBIN TOTAL: 0.5 mg/dL (ref 0.0–1.2)
BUN/Creatinine Ratio: 14 (ref 10–24)
BUN: 16 mg/dL (ref 8–27)
CHLORIDE: 100 mmol/L (ref 96–106)
CO2: 26 mmol/L (ref 20–29)
Calcium: 9.8 mg/dL (ref 8.6–10.2)
Creatinine, Ser: 1.15 mg/dL (ref 0.76–1.27)
GFR calc non Af Amer: 64 mL/min/{1.73_m2} (ref >59)
GFR, EST AFRICAN AMERICAN: 74 mL/min/{1.73_m2} (ref >59)
GLUCOSE: 103 mg/dL — AB (ref 65–99)
Globulin, Total: 2.8 g/dL (ref 1.5–4.5)
Potassium: 4.7 mmol/L (ref 3.5–5.2)
Sodium: 140 mmol/L (ref 134–144)
TOTAL PROTEIN: 7.5 g/dL (ref 6.0–8.5)

## 2018-01-24 ENCOUNTER — Telehealth: Payer: Self-pay | Admitting: Family Medicine

## 2018-01-24 NOTE — Telephone Encounter (Signed)
calling to schedule AWV

## 2018-05-10 ENCOUNTER — Other Ambulatory Visit: Payer: Self-pay | Admitting: Family Medicine

## 2018-05-22 ENCOUNTER — Other Ambulatory Visit: Payer: Self-pay | Admitting: Family Medicine

## 2018-05-22 NOTE — Telephone Encounter (Signed)
Last seen 12/28/17  Dr Dettinger 

## 2018-05-31 ENCOUNTER — Telehealth: Payer: Self-pay | Admitting: Family Medicine

## 2018-07-03 ENCOUNTER — Ambulatory Visit: Payer: Medicare Other | Admitting: Family Medicine

## 2018-07-03 ENCOUNTER — Encounter: Payer: Self-pay | Admitting: Family Medicine

## 2018-07-03 VITALS — BP 131/73 | HR 93 | Temp 97.6°F | Ht 66.0 in | Wt 191.0 lb

## 2018-07-03 DIAGNOSIS — E7800 Pure hypercholesterolemia, unspecified: Secondary | ICD-10-CM

## 2018-07-03 DIAGNOSIS — K219 Gastro-esophageal reflux disease without esophagitis: Secondary | ICD-10-CM

## 2018-07-03 DIAGNOSIS — F419 Anxiety disorder, unspecified: Secondary | ICD-10-CM

## 2018-07-03 DIAGNOSIS — I10 Essential (primary) hypertension: Secondary | ICD-10-CM | POA: Diagnosis not present

## 2018-07-03 MED ORDER — ATORVASTATIN CALCIUM 40 MG PO TABS: 40.0000 mg | ORAL_TABLET | Freq: Every day | ORAL | 3 refills | Status: DC

## 2018-07-03 MED ORDER — NABUMETONE 500 MG PO TABS
500.0000 mg | ORAL_TABLET | Freq: Every day | ORAL | 3 refills | Status: DC
Start: 2018-07-03 — End: 2019-01-06

## 2018-07-03 MED ORDER — CLONAZEPAM 1 MG PO TABS: ORAL_TABLET | ORAL | 1 refills | Status: DC

## 2018-07-03 MED ORDER — VENLAFAXINE HCL ER 75 MG PO CP24: 75.0000 mg | ORAL_CAPSULE | Freq: Every day | ORAL | 3 refills | Status: DC

## 2018-07-03 MED ORDER — BENAZEPRIL HCL 20 MG PO TABS: 20.0000 mg | ORAL_TABLET | Freq: Every day | ORAL | 3 refills | Status: DC

## 2018-07-03 MED ORDER — ALLOPURINOL 300 MG PO TABS: 300.0000 mg | ORAL_TABLET | Freq: Every day | ORAL | 3 refills | Status: DC

## 2018-07-03 MED ORDER — RANITIDINE HCL 150 MG PO TABS: 150.0000 mg | ORAL_TABLET | Freq: Two times a day (BID) | ORAL | 3 refills | Status: DC

## 2018-07-03 MED ORDER — ATENOLOL 50 MG PO TABS: 50.0000 mg | ORAL_TABLET | Freq: Every day | ORAL | 3 refills | Status: DC

## 2018-07-03 NOTE — Progress Notes (Signed)
BP 131/73   Pulse 93   Temp 97.6 F (36.4 C) (Oral)   Ht '5\' 6"'  (1.676 m)   Wt 191 lb (86.6 kg)   BMI 30.83 kg/m    Subjective:    Patient ID: Jonathan Love, male    DOB: 1945/12/07, 72 y.o.   MRN: [id number]  HPI: Jonathan Love is a 72 y.o. male presenting on 07/03/2018 for Hypertension (6 month follow up); Hyperlipidemia; and Gastroesophageal Reflux (Patient states he has had more this summer than he normaly has and he also thinks it is coming coming from amlodipine.)   HPI Hypertension Patient is currently on amlodipine and atenolol and benazepril, and their blood pressure today is 131/73. Patient denies any lightheadedness or dizziness. Patient denies headaches, blurred vision, chest pains, shortness of breath, or weakness. Denies any side effects from medication and is content with current medication.   Hyperlipidemia Patient is coming in for recheck of his hyperlipidemia. The patient is currently taking atorvastatin. They deny any issues with myalgias or history of liver damage from it. They deny any focal numbness or weakness or chest pain.   GERD Patient is currently on Zantac.  She denies any major symptoms or abdominal pain or belching or burping. She denies any blood in her stool or lightheadedness or dizziness.   Anxiety Patient is currently on Effexor and Klonopin for anxiety and feels like they are working very well for him.  He is coming to me from another provider in our office and DC refill is to continue these medications.  He denies any suicidal ideations or major issues with depression. Depression screen [facility] 2/9 07/03/2018 12/28/2017 06/27/2017 06/19/2017 12/19/2016  Decreased Interest 0 0 0 0 0  Down, Depressed, Hopeless 0 1 0 0 1  PHQ - 2 Score 0 1 0 0 1  Altered sleeping - - - - -  Tired, decreased energy - - - - -  Change in appetite - - - - -  Feeling bad or failure about yourself  - - - - -  Trouble concentrating - - - - -  Moving slowly or fidgety/restless  - - - - -  Suicidal thoughts - - - - -  PHQ-9 Score - - - - -     Relevant past medical, surgical, family and social history reviewed and updated as indicated. Interim medical history since our last visit reviewed. Allergies and medications reviewed and updated.  Review of Systems  Constitutional: Negative for chills and fever.  Eyes: Negative for visual disturbance.  Respiratory: Negative for shortness of breath and wheezing.   Cardiovascular: Negative for chest pain and leg swelling.  Gastrointestinal: Negative for abdominal pain.  Musculoskeletal: Negative for back pain and gait problem.  Skin: Negative for rash.  Psychiatric/Behavioral: Negative for dysphoric mood, self-injury, sleep disturbance and suicidal ideas. The patient is nervous/anxious.   All other systems reviewed and are negative.   Per HPI unless specifically indicated above   Allergies as of 07/03/2018      Reactions   Penicillins       Medication List        Accurate as of 07/03/18 11:59 PM. Always use your most recent med list.          allopurinol 300 MG tablet Commonly known as:  ZYLOPRIM Take 1 tablet (300 mg total) by mouth daily.   alum & mag hydroxide-simeth 200-200-20 MG/5ML suspension Commonly known as:  MAALOX/MYLANTA Take 15 mLs by mouth as needed  for indigestion or heartburn.   atenolol 50 MG tablet Commonly known as:  TENORMIN Take 1-2 tablets (50-100 mg total) by mouth daily.   atorvastatin 40 MG tablet Commonly known as:  LIPITOR Take 1 tablet (40 mg total) by mouth daily.   benazepril 20 MG tablet Commonly known as:  LOTENSIN Take 1 tablet (20 mg total) by mouth daily.   clonazePAM 1 MG tablet Commonly known as:  KLONOPIN TAKE ONE TABLET BY MOUTH TWICE DAILY AS NEEDED   nabumetone 500 MG tablet Commonly known as:  RELAFEN Take 1 tablet (500 mg total) by mouth daily.   ranitidine 150 MG tablet Commonly known as:  ZANTAC Take 1 tablet (150 mg total) by mouth 2 (two) times  daily.   sildenafil 20 MG tablet Commonly known as:  REVATIO Take 1 tablet (20 mg total) by mouth as needed.   venlafaxine XR 75 MG 24 hr capsule Commonly known as:  EFFEXOR-XR Take 1 capsule (75 mg total) by mouth daily with breakfast. Take 1 capsule daily          Objective:    BP 131/73   Pulse 93   Temp 97.6 F (36.4 C) (Oral)   Ht '5\' 6"'  (1.676 m)   Wt 191 lb (86.6 kg)   BMI 30.83 kg/m   Wt Readings from Last 3 Encounters:  07/03/18 191 lb (86.6 kg)  12/28/17 187 lb (84.8 kg)  06/27/17 187 lb (84.8 kg)    Physical Exam  Constitutional: He is oriented to person, place, and time. He appears well-developed and well-nourished. No distress.  Eyes: Conjunctivae are normal. No scleral icterus.  Neck: Neck supple. No thyromegaly present.  Cardiovascular: Normal rate, regular rhythm, normal heart sounds and intact distal pulses.  No murmur heard. Pulmonary/Chest: Effort normal and breath sounds normal. No respiratory distress. He has no wheezes.  Musculoskeletal: Normal range of motion. He exhibits no edema.  Lymphadenopathy:    He has no cervical adenopathy.  Neurological: He is alert and oriented to person, place, and time. Coordination normal.  Skin: Skin is warm and dry. No rash noted. He is not diaphoretic.  Psychiatric: He has a normal mood and affect. His behavior is normal.  Nursing note and vitals reviewed.       Assessment & Plan:   Problem List Items Addressed This Visit      Cardiovascular and Mediastinum   HTN (hypertension) - Primary   Relevant Medications   atorvastatin (LIPITOR) 40 MG tablet   benazepril (LOTENSIN) 20 MG tablet   atenolol (TENORMIN) 50 MG tablet   Other Relevant Orders   CMP14+EGFR (Completed)     Digestive   GERD (gastroesophageal reflux disease)   Relevant Medications   ranitidine (ZANTAC) 150 MG tablet   Other Relevant Orders   CBC with Differential/Platelet (Completed)     Other   HLD (hyperlipidemia)   Relevant  Medications   atorvastatin (LIPITOR) 40 MG tablet   benazepril (LOTENSIN) 20 MG tablet   atenolol (TENORMIN) 50 MG tablet   Other Relevant Orders   Lipid panel (Completed)   Anxiety   Relevant Medications   venlafaxine XR (EFFEXOR-XR) 75 MG 24 hr capsule   clonazePAM (KLONOPIN) 1 MG tablet    Any current medications that is been on and will see back for regular checkups in 3 months.  Follow up plan: Return in about 3 months (around 10/02/2018), or if symptoms worsen or fail to improve, for Anxiety and cholesterol and hypertension recheck.  Counseling  provided for all of the vaccine components Orders Placed This Encounter  Procedures  . CBC with Differential/Platelet  . CMP14+EGFR  . Lipid panel    Caryl Pina, MD [facility] 07/09/2018, 9:49 PM

## 2018-07-04 LAB — LIPID PANEL
CHOLESTEROL TOTAL: 197 mg/dL (ref 100–199)
Chol/HDL Ratio: 4.1 ratio (ref 0.0–5.0)
HDL: 48 mg/dL (ref >39)
LDL CALC: 98 mg/dL (ref 0–99)
TRIGLYCERIDES: 256 mg/dL — AB (ref 0–149)
VLDL Cholesterol Cal: 51 mg/dL — ABNORMAL HIGH (ref 5–40)

## 2018-07-04 LAB — CMP14+EGFR
ALBUMIN: 4.5 g/dL (ref 3.5–4.8)
ALK PHOS: 97 IU/L (ref 39–117)
ALT: 12 IU/L (ref 0–44)
AST: 16 IU/L (ref 0–40)
Albumin/Globulin Ratio: 1.7 (ref 1.2–2.2)
BILIRUBIN TOTAL: 0.5 mg/dL (ref 0.0–1.2)
BUN / CREAT RATIO: 14 (ref 10–24)
BUN: 16 mg/dL (ref 8–27)
CHLORIDE: 99 mmol/L (ref 96–106)
CO2: 21 mmol/L (ref 20–29)
CREATININE: 1.13 mg/dL (ref 0.76–1.27)
Calcium: 9.5 mg/dL (ref 8.6–10.2)
GFR calc Af Amer: 75 mL/min/{1.73_m2} (ref >59)
GFR calc non Af Amer: 65 mL/min/{1.73_m2} (ref >59)
GLOBULIN, TOTAL: 2.6 g/dL (ref 1.5–4.5)
GLUCOSE: 108 mg/dL — AB (ref 65–99)
Potassium: 4.7 mmol/L (ref 3.5–5.2)
SODIUM: 139 mmol/L (ref 134–144)
Total Protein: 7.1 g/dL (ref 6.0–8.5)

## 2018-07-04 LAB — CBC WITH DIFFERENTIAL/PLATELET
BASOS ABS: 0 10*3/uL (ref 0.0–0.2)
Basos: 1 %
EOS (ABSOLUTE): 0.3 10*3/uL (ref 0.0–0.4)
Eos: 4 %
Hematocrit: 43.8 % (ref 37.5–51.0)
Hemoglobin: 15.1 g/dL (ref 13.0–17.7)
Immature Grans (Abs): 0 10*3/uL (ref 0.0–0.1)
Immature Granulocytes: 0 %
LYMPHS ABS: 1.5 10*3/uL (ref 0.7–3.1)
Lymphs: 20 %
MCH: 29.7 pg (ref 26.6–33.0)
MCHC: 34.5 g/dL (ref 31.5–35.7)
MCV: 86 fL (ref 79–97)
MONOS ABS: 0.5 10*3/uL (ref 0.1–0.9)
Monocytes: 7 %
NEUTROS ABS: 5.3 10*3/uL (ref 1.4–7.0)
Neutrophils: 68 %
Platelets: 213 10*3/uL (ref 150–450)
RBC: 5.09 x10E6/uL (ref 4.14–5.80)
RDW: 15.2 % (ref 12.3–15.4)
WBC: 7.7 10*3/uL (ref 3.4–10.8)

## 2018-07-15 ENCOUNTER — Telehealth: Payer: Self-pay | Admitting: Family Medicine

## 2018-07-15 NOTE — Telephone Encounter (Signed)
Has he been checking his blood pressures at all, if he has what have they and running.

## 2018-07-15 NOTE — Telephone Encounter (Signed)
Patient states that he has been taking atenolol one daily and he has not been having any side effects. Takes his bp once a week and it has been runing good.   9/12- 121/75 9/16- 132/77

## 2018-07-16 NOTE — Telephone Encounter (Signed)
Those blood pressures look good, just keep an eye on the blood pressure and I think keeping those medications off for now is a good idea.

## 2018-07-16 NOTE — Telephone Encounter (Signed)
Aware of recommendations  

## 2018-11-06 DIAGNOSIS — H5213 Myopia, bilateral: Secondary | ICD-10-CM | POA: Diagnosis not present

## 2018-11-13 DIAGNOSIS — H2511 Age-related nuclear cataract, right eye: Secondary | ICD-10-CM | POA: Diagnosis not present

## 2018-11-13 DIAGNOSIS — H25811 Combined forms of age-related cataract, right eye: Secondary | ICD-10-CM | POA: Diagnosis not present

## 2018-11-13 DIAGNOSIS — Z01818 Encounter for other preprocedural examination: Secondary | ICD-10-CM | POA: Diagnosis not present

## 2018-12-06 DIAGNOSIS — Z961 Presence of intraocular lens: Secondary | ICD-10-CM | POA: Diagnosis not present

## 2019-01-06 ENCOUNTER — Encounter: Payer: Self-pay | Admitting: Family Medicine

## 2019-01-06 ENCOUNTER — Ambulatory Visit (INDEPENDENT_AMBULATORY_CARE_PROVIDER_SITE_OTHER): Payer: Medicare Other | Admitting: Family Medicine

## 2019-01-06 VITALS — BP 154/80 | HR 61 | Temp 97.1°F | Ht 66.0 in | Wt 190.0 lb

## 2019-01-06 DIAGNOSIS — K219 Gastro-esophageal reflux disease without esophagitis: Secondary | ICD-10-CM

## 2019-01-06 DIAGNOSIS — R7303 Prediabetes: Secondary | ICD-10-CM

## 2019-01-06 DIAGNOSIS — E7800 Pure hypercholesterolemia, unspecified: Secondary | ICD-10-CM

## 2019-01-06 DIAGNOSIS — H6122 Impacted cerumen, left ear: Secondary | ICD-10-CM

## 2019-01-06 DIAGNOSIS — F419 Anxiety disorder, unspecified: Secondary | ICD-10-CM

## 2019-01-06 DIAGNOSIS — I10 Essential (primary) hypertension: Secondary | ICD-10-CM | POA: Diagnosis not present

## 2019-01-06 LAB — BAYER DCA HB A1C WAIVED: HB A1C (BAYER DCA - WAIVED): 5.9 % (ref <7.0)

## 2019-01-06 MED ORDER — CLONAZEPAM 1 MG PO TABS: ORAL_TABLET | ORAL | 1 refills | Status: DC

## 2019-01-06 MED ORDER — NABUMETONE 500 MG PO TABS: 500.0000 mg | ORAL_TABLET | Freq: Every day | ORAL | 3 refills | Status: DC

## 2019-01-06 MED ORDER — OMEPRAZOLE 20 MG PO CPDR: 20.0000 mg | DELAYED_RELEASE_CAPSULE | Freq: Every day | ORAL | 3 refills | Status: DC

## 2019-01-06 NOTE — Progress Notes (Signed)
BP (!) 154/80   Pulse 61   Temp (!) 97.1 F (36.2 C) (Oral)   Ht '5\' 6"'  (1.676 m)   Wt 190 lb (86.2 kg)   BMI 30.67 kg/m    Subjective:    Patient ID: Jonathan Love, male    DOB: [birth year], 73 y.o.   MRN: [id number]  HPI: Jonathan Love is a 73 y.o. male presenting on 01/06/2019 for Hypertension (6 month follow up) and Hyperlipidemia   HPI Prediabetes Patient comes in today for recheck of his diabetes. Patient has been currently taking no medication we have been monitoring for now.. Patient is currently on an ACE inhibitor/ARB. Patient has seen an ophthalmologist this year, just recently had cataract replaced on the right eye. Patient denies any issues with their feet.   Hypertension Patient is currently on atenolol and benazepril, and their blood pressure today is 154/80, he has been having some dizziness and some pain since his eye surgery because of being sedated on the table and that is what he complains of today.. Patient denies any lightheadedness or dizziness. Patient denies headaches, blurred vision, chest pains, shortness of breath, or weakness. Denies any side effects from medication and is content with current medication.   Hyperlipidemia Patient is coming in for recheck of his hyperlipidemia. The patient is currently taking Lipitor. They deny any issues with myalgias or history of liver damage from it. They deny any focal numbness or weakness or chest pain.   GERD Patient is currently on famotidine but it is not working well for him, ranitidine used to work well for him but he needs something better this time..  She denies any major symptoms or abdominal pain or belching or burping. She denies any blood in her stool or lightheadedness or dizziness.   Anxiety recheck Patient is coming in today for anxiety recheck.  He currently uses clonazepam 1 mg by mouth twice daily as needed.  He has been very consistent on this and feels like it works well for him.  Patient denies any  suicidal ideations or thoughts of depression. Depression screen [facility] 2/9 01/06/2019 07/03/2018 12/28/2017 06/27/2017 06/19/2017  Decreased Interest 1 0 0 0 0  Down, Depressed, Hopeless 1 0 1 0 0  PHQ - 2 Score 2 0 1 0 0  Altered sleeping 3 - - - -  Tired, decreased energy 2 - - - -  Change in appetite 0 - - - -  Feeling bad or failure about yourself  0 - - - -  Trouble concentrating 1 - - - -  Moving slowly or fidgety/restless 1 - - - -  Suicidal thoughts 0 - - - -  PHQ-9 Score 9 - - - -     Relevant past medical, surgical, family and social history reviewed and updated as indicated. Interim medical history since our last visit reviewed. Allergies and medications reviewed and updated.  Review of Systems  Constitutional: Negative for chills and fever.  HENT: Positive for ear discharge. Negative for congestion, sinus pressure and sinus pain.   Eyes: Negative for visual disturbance.  Respiratory: Negative for shortness of breath and wheezing.   Cardiovascular: Negative for chest pain and leg swelling.  Musculoskeletal: Negative for back pain and gait problem.  Skin: Negative for rash.  Neurological: Positive for dizziness. Negative for weakness, light-headedness and numbness.  All other systems reviewed and are negative.   Per HPI unless specifically indicated above   Allergies as of 01/06/2019  Reactions   Penicillins       Medication List       Accurate as of January 06, 2019  1:32 PM. Always use your most recent med list.        allopurinol 300 MG tablet Commonly known as:  ZYLOPRIM Take 1 tablet (300 mg total) by mouth daily.   alum & mag hydroxide-simeth 200-200-20 MG/5ML suspension Commonly known as:  MAALOX/MYLANTA Take 15 mLs by mouth as needed for indigestion or heartburn.   atenolol 50 MG tablet Commonly known as:  TENORMIN Take 1-2 tablets (50-100 mg total) by mouth daily.   atorvastatin 40 MG tablet Commonly known as:  LIPITOR Take 1 tablet (40 mg total) by  mouth daily.   benazepril 20 MG tablet Commonly known as:  LOTENSIN Take 1 tablet (20 mg total) by mouth daily.   clonazePAM 1 MG tablet Commonly known as:  KLONOPIN TAKE ONE TABLET BY MOUTH TWICE DAILY AS NEEDED   nabumetone 500 MG tablet Commonly known as:  RELAFEN Take 1 tablet (500 mg total) by mouth daily.   sildenafil 20 MG tablet Commonly known as:  REVATIO Take 1 tablet (20 mg total) by mouth as needed.   venlafaxine XR 75 MG 24 hr capsule Commonly known as:  EFFEXOR-XR Take 1 capsule (75 mg total) by mouth daily with breakfast. Take 1 capsule daily          Objective:    BP (!) 154/80   Pulse 61   Temp (!) 97.1 F (36.2 C) (Oral)   Ht '5\' 6"'  (1.676 m)   Wt 190 lb (86.2 kg)   BMI 30.67 kg/m   Wt Readings from Last 3 Encounters:  01/06/19 190 lb (86.2 kg)  07/03/18 191 lb (86.6 kg)  12/28/17 187 lb (84.8 kg)    Physical Exam Vitals signs and nursing note reviewed.  Constitutional:      General: He is not in acute distress.    Appearance: He is well-developed. He is not diaphoretic.  Eyes:     General: No scleral icterus.    Conjunctiva/sclera: Conjunctivae normal.  Neck:     Musculoskeletal: Neck supple.     Thyroid: No thyromegaly.  Cardiovascular:     Rate and Rhythm: Normal rate and regular rhythm.     Heart sounds: Normal heart sounds. No murmur.  Pulmonary:     Effort: Pulmonary effort is normal. No respiratory distress.     Breath sounds: Normal breath sounds. No wheezing.  Musculoskeletal: Normal range of motion.  Lymphadenopathy:     Cervical: No cervical adenopathy.  Skin:    General: Skin is warm and dry.     Findings: No rash.  Neurological:     Mental Status: He is alert and oriented to person, place, and time.     Coordination: Coordination normal.  Psychiatric:        Mood and Affect: Mood is anxious and depressed.        Behavior: Behavior normal.        Thought Content: Thought content does not include suicidal ideation.  Thought content does not include suicidal plan.     Nurse did cerumen lavage, patient tolerated well, unable to clear wax, recommended Debrox drops    Assessment & Plan:   Problem List Items Addressed This Visit      Cardiovascular and Mediastinum   HTN (hypertension)   Relevant Orders   CMP14+EGFR (Completed)     Digestive   GERD (gastroesophageal reflux disease)  Relevant Medications   omeprazole (PRILOSEC) 20 MG capsule   Other Relevant Orders   CBC with Differential/Platelet (Completed)     Other   HLD (hyperlipidemia)   Relevant Orders   Lipid panel (Completed)   Prediabetes - Primary   Relevant Orders   Bayer DCA Hb A1c Waived (Completed)   Anxiety   Relevant Medications   clonazePAM (KLONOPIN) 1 MG tablet    Other Visit Diagnoses    Hearing loss secondary to cerumen impaction, left          Continue Prilosec and clonazepam Follow up plan: Return in about 6 months (around 07/09/2019), or if symptoms worsen or fail to improve, for Prediabetes hypertension.  Counseling provided for all of the vaccine components Orders Placed This Encounter  Procedures  . CBC with Differential/Platelet  . CMP14+EGFR  . Lipid panel  . Bayer [facility] Hb A1c [place], MD [facility] 01/06/2019, 1:32 PM

## 2019-01-07 LAB — CBC WITH DIFFERENTIAL/PLATELET
Basophils Absolute: 0.1 10*3/uL (ref 0.0–0.2)
Basos: 1 %
EOS (ABSOLUTE): 0.4 10*3/uL (ref 0.0–0.4)
Eos: 5 %
HEMATOCRIT: 44.9 % (ref 37.5–51.0)
Hemoglobin: 15.3 g/dL (ref 13.0–17.7)
IMMATURE GRANULOCYTES: 0 %
Immature Grans (Abs): 0 10*3/uL (ref 0.0–0.1)
LYMPHS ABS: 1.7 10*3/uL (ref 0.7–3.1)
Lymphs: 21 %
MCH: 28.9 pg (ref 26.6–33.0)
MCHC: 34.1 g/dL (ref 31.5–35.7)
MCV: 85 fL (ref 79–97)
MONOS ABS: 0.6 10*3/uL (ref 0.1–0.9)
Monocytes: 7 %
NEUTROS PCT: 66 %
Neutrophils Absolute: 5.4 10*3/uL (ref 1.4–7.0)
PLATELETS: 181 10*3/uL (ref 150–450)
RBC: 5.3 x10E6/uL (ref 4.14–5.80)
RDW: 14.9 % (ref 11.6–15.4)
WBC: 8.2 10*3/uL (ref 3.4–10.8)

## 2019-01-07 LAB — LIPID PANEL
CHOL/HDL RATIO: 4.4 ratio (ref 0.0–5.0)
CHOLESTEROL TOTAL: 183 mg/dL (ref 100–199)
HDL: 42 mg/dL (ref >39)
LDL Calculated: 79 mg/dL (ref 0–99)
Triglycerides: 309 mg/dL — ABNORMAL HIGH (ref 0–149)
VLDL Cholesterol Cal: 62 mg/dL — ABNORMAL HIGH (ref 5–40)

## 2019-01-07 LAB — CMP14+EGFR
A/G RATIO: 1.9 (ref 1.2–2.2)
ALK PHOS: 108 IU/L (ref 39–117)
ALT: 20 IU/L (ref 0–44)
AST: 16 IU/L (ref 0–40)
Albumin: 4.6 g/dL (ref 3.7–4.7)
BILIRUBIN TOTAL: 0.6 mg/dL (ref 0.0–1.2)
BUN/Creatinine Ratio: 15 (ref 10–24)
BUN: 17 mg/dL (ref 8–27)
CHLORIDE: 97 mmol/L (ref 96–106)
CO2: 25 mmol/L (ref 20–29)
Calcium: 9.8 mg/dL (ref 8.6–10.2)
Creatinine, Ser: 1.11 mg/dL (ref 0.76–1.27)
GFR calc Af Amer: 76 mL/min/{1.73_m2} (ref >59)
GFR, EST NON AFRICAN AMERICAN: 66 mL/min/{1.73_m2} (ref >59)
GLOBULIN, TOTAL: 2.4 g/dL (ref 1.5–4.5)
Glucose: 97 mg/dL (ref 65–99)
POTASSIUM: 5.1 mmol/L (ref 3.5–5.2)
SODIUM: 139 mmol/L (ref 134–144)
Total Protein: 7 g/dL (ref 6.0–8.5)

## 2019-04-15 ENCOUNTER — Encounter: Payer: Self-pay | Admitting: Internal Medicine

## 2019-05-19 ENCOUNTER — Encounter: Payer: Self-pay | Admitting: Family Medicine

## 2019-05-19 ENCOUNTER — Other Ambulatory Visit: Payer: Self-pay

## 2019-05-19 ENCOUNTER — Ambulatory Visit (INDEPENDENT_AMBULATORY_CARE_PROVIDER_SITE_OTHER): Payer: Medicare Other | Admitting: Family Medicine

## 2019-05-19 DIAGNOSIS — H6692 Otitis media, unspecified, left ear: Secondary | ICD-10-CM

## 2019-05-19 MED ORDER — CEFDINIR 300 MG PO CAPS: 300.0000 mg | ORAL_CAPSULE | Freq: Two times a day (BID) | ORAL | 0 refills | Status: DC

## 2019-05-19 NOTE — Progress Notes (Signed)
Virtual Visit via telephone Note Due to COVID-19, visit is conducted virtually and was requested by patient. This visit type was conducted due to national recommendations for restrictions regarding the COVID-19 Pandemic (e.g. social distancing) in an effort to limit this patient's exposure and mitigate transmission in our community. All issues noted in this document were discussed and addressed.  A physical exam was not performed with this format.   I connected with Jonathan Love on 05/19/19 at 1445 by telephone and verified that I am speaking with the correct person using two identifiers. Jonathan Love is currently located at home and family is currently with them during visit. The provider, Monia Pouch, FNP is located in their office at time of visit.  I discussed the limitations, risks, security and privacy concerns of performing an evaluation and management service by telephone and the availability of in person appointments. I also discussed with the patient that there may be a patient responsible charge related to this service. The patient expressed understanding and agreed to proceed.  Subjective:  Patient ID: Jonathan Love, male    DOB: May 12, 1946, 73 y.o.   MRN: [id number]  Chief Complaint:  Otalgia (Left)   HPI: Jonathan Love is a 73 y.o. male presenting on 05/19/2019 for Otalgia (Left)   Pt reports ongoing left ear pain for several days. States it was aching at first but over the last 3 days it has been throbbing and sharp. He denies fever, chills, headache, tinnitus, vertigo, or lymphadenopathy. No drainage from ear. Does have hearing difficulties, this is chronic.   Otalgia  There is pain in the left ear. This is a new problem. The current episode started 1 to 4 weeks ago. The problem occurs constantly. The problem has been gradually worsening. There has been no fever. The pain is at a severity of 5/10. The pain is moderate. Associated symptoms include hearing loss.  Pertinent negatives include no abdominal pain, coughing, diarrhea, ear discharge, headaches, neck pain, rash, rhinorrhea, sore throat or vomiting. He has tried acetaminophen for the symptoms. The treatment provided no relief. His past medical history is significant for hearing loss.     Relevant past medical, surgical, family, and social history reviewed and updated as indicated.  Allergies and medications reviewed and updated.   Past Medical History:  Diagnosis Date  . Anxiety   . Cataract   . CTS (carpal tunnel syndrome)    left  . Diverticulitis   . GERD (gastroesophageal reflux disease)   . Gout   . 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 Hyperlipidemia   . Hypertension   . Migraine   . OAB (overactive bladder)   . Prediabetes     Past Surgical History:  Procedure Laterality Date  . EYE SURGERY Left 08/24/2016   retinal - dr Rodman Key = laser eye  . right lateral epicondylitis    . vocal cord nodule      Social History   Socioeconomic History  . Marital status: Married    Spouse name: Not on file  . Number of children: Not on file  . Years of education: Not on file  . Highest education level: Not on file  Occupational History  . Not on file  Social Needs  . Financial resource strain: Not on file  . Food insecurity    Worry: Not on file    Inability: Not on file  . Transportation needs    Medical: Not on file    Non-medical:  Not on file  Tobacco Use  . Smoking status: Never Smoker  . Smokeless tobacco: Never Used  Substance and Sexual Activity  . Alcohol use: No  . Drug use: No  . Sexual activity: Not on file  Lifestyle  . Physical activity    Days per week: Not on file    Minutes per session: Not on file  . Stress: Not on file  Relationships  . Social Herbalist on phone: Not on file    Gets together: Not on file    Attends religious service: Not on file    Active member of club or organization: Not on file    Attends meetings of clubs or  organizations: Not on file    Relationship status: Not on file  . Intimate partner violence    Fear of current or ex partner: Not on file    Emotionally abused: Not on file    Physically abused: Not on file    Forced sexual activity: Not on file  Other Topics Concern  . Not on file  Social History Narrative  . Not on file    Outpatient Encounter Medications as of 05/19/2019  Medication Sig  . allopurinol (ZYLOPRIM) 300 MG tablet Take 1 tablet (300 mg total) by mouth daily.  Marland Kitchen alum & mag hydroxide-simeth (MAALOX/MYLANTA) 200-200-20 MG/5ML suspension Take 15 mLs by mouth as needed for indigestion or heartburn.  Marland Kitchen atenolol (TENORMIN) 50 MG tablet Take 1-2 tablets (50-100 mg total) by mouth daily.  Marland Kitchen atorvastatin (LIPITOR) 40 MG tablet Take 1 tablet (40 mg total) by mouth daily.  . benazepril (LOTENSIN) 20 MG tablet Take 1 tablet (20 mg total) by mouth daily.  . cefdinir (OMNICEF) 300 MG capsule Take 1 capsule (300 mg total) by mouth 2 (two) times daily. 1 po BID  . clonazePAM (KLONOPIN) 1 MG tablet TAKE ONE TABLET BY MOUTH TWICE DAILY AS NEEDED  . nabumetone (RELAFEN) 500 MG tablet Take 1 tablet (500 mg total) by mouth daily.  Marland Kitchen omeprazole (PRILOSEC) 20 MG capsule Take 1 capsule (20 mg total) by mouth daily.  . sildenafil (REVATIO) 20 MG tablet Take 1 tablet (20 mg total) by mouth as needed.  . venlafaxine XR (EFFEXOR-XR) 75 MG 24 hr capsule Take 1 capsule (75 mg total) by mouth daily with breakfast. Take 1 capsule daily   No facility-administered encounter medications on file as of 05/19/2019.     Allergies  Allergen Reactions  . Penicillins     Review of Systems  Constitutional: Negative for chills, fatigue and fever.  HENT: Positive for ear pain and hearing loss. Negative for congestion, ear discharge, rhinorrhea, sore throat and tinnitus.   Respiratory: Negative for cough and shortness of breath.   Cardiovascular: Negative for chest pain and palpitations.  Gastrointestinal:  Negative for abdominal pain, diarrhea and vomiting.  Musculoskeletal: Negative for arthralgias and neck pain.  Skin: Negative for rash.  Neurological: Negative for dizziness, weakness, light-headedness and headaches.  Psychiatric/Behavioral: Negative for confusion.  All other systems reviewed and are negative.        Observations/Objective: No vital signs or physical exam, this was a telephone or virtual health encounter.  Pt alert and oriented, answers all questions appropriately, and able to speak in full sentences.    Assessment and Plan: Elpidio was seen today for otalgia.  Diagnoses and all orders for this visit:  Left acute otitis media Reported symptoms consistent with acute otitis media. Due to length of symptoms and  pt allergies, will treat with cefdinir. Symptomatic care discussed. Pt aware to report any new or worsening symptoms. Follow up in 2 weeks for reevaluation.  -     cefdinir (OMNICEF) 300 MG capsule; Take 1 capsule (300 mg total) by mouth 2 (two) times daily. 1 po BID     Follow Up Instructions: Return in about 2 weeks (around 06/02/2019), or if symptoms worsen or fail to improve, for otitis media.    I discussed the assessment and treatment plan with the patient. The patient was provided an opportunity to ask questions and all were answered. The patient agreed with the plan and demonstrated an understanding of the instructions.   The patient was advised to call back or seek an in-person evaluation if the symptoms worsen or if the condition fails to improve as anticipated.  The above assessment and management plan was discussed with the patient. The patient verbalized understanding of and has agreed to the management plan. Patient is aware to call the clinic if symptoms persist or worsen. Patient is aware when to return to the clinic for a follow-up visit. Patient educated on when it is appropriate to go to the emergency department.    I provided 15 minutes of  non-face-to-face time during this encounter. The call started at 1445. The call ended at 1500. The other time was used for coordination of care.    Monia Pouch, FNP-C [facility] [address] [place], [place] [address] [phone]

## 2019-05-28 ENCOUNTER — Encounter: Payer: Self-pay | Admitting: Family

## 2019-05-28 ENCOUNTER — Other Ambulatory Visit: Payer: Self-pay

## 2019-05-28 ENCOUNTER — Ambulatory Visit (INDEPENDENT_AMBULATORY_CARE_PROVIDER_SITE_OTHER): Payer: Medicare Other | Admitting: Family

## 2019-05-28 DIAGNOSIS — H65115 Acute and subacute allergic otitis media (mucoid) (sanguinous) (serous), recurrent, left ear: Secondary | ICD-10-CM

## 2019-05-28 MED ORDER — FLUTICASONE PROPIONATE 50 MCG/ACT NA SUSP: 2.0000 | Freq: Every day | NASAL | 6 refills | Status: DC

## 2019-05-28 MED ORDER — CETIRIZINE HCL 10 MG PO TABS: 10.0000 mg | ORAL_TABLET | Freq: Every day | ORAL | 11 refills | Status: DC

## 2019-05-28 MED ORDER — DOXYCYCLINE HYCLATE 100 MG PO TABS: 100.0000 mg | ORAL_TABLET | Freq: Two times a day (BID) | ORAL | 0 refills | Status: DC

## 2019-05-28 NOTE — Progress Notes (Signed)
Virtual Visit via telephone Note Due to COVID-19 pandemic this visit was conducted virtually. This visit type was conducted due to national recommendations for restrictions regarding the COVID-19 Pandemic (e.g. social distancing, sheltering in place) in an effort to limit this patient's exposure and mitigate transmission in our community. All issues noted in this document were discussed and addressed.  A physical exam was not performed with this format.  I connected with Jonathan Love on 05/28/19 at 2:39 pm  by telephone and verified that I am speaking with the correct person using two identifiers. Jonathan Love is currently located at home and wife is currently with her during visit. The provider, Evelina Dun, FNP is located in their office at time of visit.  I discussed the limitations, risks, security and privacy concerns of performing an evaluation and management service by telephone and the availability of in person appointments. I also discussed with the patient that there may be a patient responsible charge related to this service. The patient expressed understanding and agreed to proceed.   History and Present Illness:  PT calls the office today with recurrent ear fullness. He had a virtual visit on 05/19/19 and started on Cefdinir. He reports his pain is improved, but still has ear fullness and having yellow discharge. He states he is also having post nasal drainage and mild sore throat.  Ear Fullness  There is pain in the left ear. This is a recurrent problem. The current episode started in the past 7 days. The problem occurs every few minutes. The problem has been waxing and waning. There has been no fever. The pain is at a severity of 3/10. The pain is mild. Associated symptoms include ear discharge and hearing loss. Pertinent negatives include no coughing, rhinorrhea or sore throat. Treatments tried: antibitoics. The treatment provided mild relief.      Review of Systems   HENT: Positive for ear discharge and hearing loss. Negative for rhinorrhea and sore throat.   Respiratory: Negative for cough.      Observations/Objective: No SOB or distress noted  Assessment and Plan: 1. 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  Dispense: 30 tablet; Refill: 11 - fluticasone (FLONASE) 50 MCG/ACT nasal spray; Place 2 sprays into both nostrils daily.  Dispense: 16 g; Refill: 6 - doxycycline (VIBRA-TABS) 100 MG tablet; Take 1 tablet (100 mg total) by mouth 2 (two) times daily.  Dispense: 20 tablet; Refill: 0     I discussed the assessment and treatment plan with the patient. The patient was provided an opportunity to ask questions and all were answered. The patient agreed with the plan and demonstrated an understanding of the instructions.   The patient was advised to call back or seek an in-person evaluation if the symptoms worsen or if the condition fails to improve as anticipated.  The above assessment and management plan was discussed with the patient. The patient verbalized understanding of and has agreed to the management plan. Patient is aware to call the clinic if symptoms persist or worsen. Patient is aware when to return to the clinic for a follow-up visit. Patient educated on when it is appropriate to go to the emergency department.  Time call ended:  2:52 pm   I provided 13 minutes of non-face-to-face time during this encounter.    Evelina Dun, FNP

## 2019-06-03 ENCOUNTER — Other Ambulatory Visit: Payer: Self-pay | Admitting: Family Medicine

## 2019-06-03 DIAGNOSIS — I10 Essential (primary) hypertension: Secondary | ICD-10-CM

## 2019-07-09 ENCOUNTER — Other Ambulatory Visit: Payer: Self-pay | Admitting: *Deleted

## 2019-07-09 ENCOUNTER — Encounter: Payer: Self-pay | Admitting: Family Medicine

## 2019-07-09 ENCOUNTER — Other Ambulatory Visit: Payer: Self-pay

## 2019-07-09 ENCOUNTER — Ambulatory Visit (INDEPENDENT_AMBULATORY_CARE_PROVIDER_SITE_OTHER): Payer: Medicare Other | Admitting: Family Medicine

## 2019-07-09 VITALS — BP 154/84 | HR 64 | Temp 98.0°F | Ht 66.0 in | Wt 197.8 lb

## 2019-07-09 DIAGNOSIS — K219 Gastro-esophageal reflux disease without esophagitis: Secondary | ICD-10-CM | POA: Diagnosis not present

## 2019-07-09 DIAGNOSIS — R7303 Prediabetes: Secondary | ICD-10-CM | POA: Diagnosis not present

## 2019-07-09 DIAGNOSIS — R350 Frequency of micturition: Secondary | ICD-10-CM

## 2019-07-09 DIAGNOSIS — Z79891 Long term (current) use of opiate analgesic: Secondary | ICD-10-CM | POA: Diagnosis not present

## 2019-07-09 DIAGNOSIS — F419 Anxiety disorder, unspecified: Secondary | ICD-10-CM | POA: Diagnosis not present

## 2019-07-09 DIAGNOSIS — H60332 Swimmer's ear, left ear: Secondary | ICD-10-CM

## 2019-07-09 DIAGNOSIS — N401 Enlarged prostate with lower urinary tract symptoms: Secondary | ICD-10-CM

## 2019-07-09 DIAGNOSIS — Z125 Encounter for screening for malignant neoplasm of prostate: Secondary | ICD-10-CM

## 2019-07-09 DIAGNOSIS — E7800 Pure hypercholesterolemia, unspecified: Secondary | ICD-10-CM | POA: Diagnosis not present

## 2019-07-09 DIAGNOSIS — I10 Essential (primary) hypertension: Secondary | ICD-10-CM

## 2019-07-09 LAB — BAYER DCA HB A1C WAIVED: HB A1C (BAYER DCA - WAIVED): 5.9 % (ref <7.0)

## 2019-07-09 MED ORDER — CLONAZEPAM 1 MG PO TABS: ORAL_TABLET | ORAL | 1 refills | Status: DC

## 2019-07-09 MED ORDER — ATORVASTATIN CALCIUM 40 MG PO TABS: 40.0000 mg | ORAL_TABLET | Freq: Every day | ORAL | 3 refills | Status: DC

## 2019-07-09 MED ORDER — NEOMYCIN-POLYMYXIN-HC 3.5-10000-1 OT SOLN: 3.0000 [drp] | Freq: Four times a day (QID) | OTIC | 0 refills | Status: DC

## 2019-07-09 MED ORDER — NEOMYCIN-POLYMYXIN-HC 3.5-10000-1 OT SOLN: 3.0000 [drp] | Freq: Four times a day (QID) | OTIC | 0 refills | Status: AC

## 2019-07-09 MED ORDER — OMEPRAZOLE 20 MG PO CPDR: 20.0000 mg | DELAYED_RELEASE_CAPSULE | Freq: Every day | ORAL | 3 refills | Status: DC

## 2019-07-09 MED ORDER — NABUMETONE 500 MG PO TABS: 500.0000 mg | ORAL_TABLET | Freq: Every day | ORAL | 3 refills | Status: DC

## 2019-07-09 MED ORDER — ALLOPURINOL 300 MG PO TABS: 300.0000 mg | ORAL_TABLET | Freq: Every day | ORAL | 3 refills | Status: DC

## 2019-07-09 MED ORDER — VENLAFAXINE HCL ER 75 MG PO CP24: 75.0000 mg | ORAL_CAPSULE | Freq: Every day | ORAL | 3 refills | Status: DC

## 2019-07-09 MED ORDER — BENAZEPRIL HCL 20 MG PO TABS: 20.0000 mg | ORAL_TABLET | Freq: Every day | ORAL | 3 refills | Status: DC

## 2019-07-09 MED ORDER — TAMSULOSIN HCL 0.4 MG PO CAPS: 0.4000 mg | ORAL_CAPSULE | Freq: Every day | ORAL | 3 refills | Status: DC

## 2019-07-09 MED ORDER — ATENOLOL 50 MG PO TABS: 50.0000 mg | ORAL_TABLET | Freq: Every day | ORAL | 3 refills | Status: DC

## 2019-07-09 NOTE — Addendum Note (Signed)
Addended by: Nigel Berthold C on: 07/09/2019 05:00 PM   Modules accepted: Orders

## 2019-07-09 NOTE — Progress Notes (Signed)
BP (!) 154/84   Pulse 64   Temp 98 F (36.7 C) (Temporal)   Ht '5\' 6"'  (1.676 m)   Wt 197 lb 12.8 oz (89.7 kg)   SpO2 98%   BMI 31.93 kg/m    Subjective:   Patient ID: Jonathan Love, male    DOB: 1946/03/10, 73 y.o.   MRN: [id number]  HPI: Jonathan Love is a 72 y.o. male presenting on 07/09/2019 for Annual Exam   HPI Well exam and recheck of chronic issues Patient is coming in for well adult exam and recheck of chronic issues.  Is the only major complaints that he has is that he still having some problems with drainage from his ears yellow and thicker.  And then he is having urinary frequency that is been increasing over the past 6 months to a year.  Patient denies any hematuria or dysuria but just has the frequency that is been bothering him.  Prediabetes Patient comes in today for recheck of his diabetes. Patient has been currently taking no medication currently and we have been monitoring. Patient is currently on an ACE inhibitor/ARB. Patient has not seen an ophthalmologist this year. Patient denies any issues with their feet.   Hyperlipidemia Patient is coming in for recheck of his hyperlipidemia. The patient is currently taking atorvastatin. They deny any issues with myalgias or history of liver damage from it. They deny any focal numbness or weakness or chest pain.   GERD Patient is currently on omeprazole.  She denies any major symptoms or abdominal pain or belching or burping. She denies any blood in her stool or lightheadedness or dizziness.   Relevant past medical, surgical, family and social history reviewed and updated as indicated. Interim medical history since our last visit reviewed. Allergies and medications reviewed and updated.  Review of Systems  Constitutional: Negative for chills and fever.  Eyes: Negative for visual disturbance.  Respiratory: Negative for shortness of breath and wheezing.   Cardiovascular: Negative for chest pain and leg swelling.   Gastrointestinal: Negative for abdominal pain.  Genitourinary: Positive for frequency. Negative for decreased urine volume, difficulty urinating, flank pain, hematuria and urgency.  Musculoskeletal: Negative for back pain and gait problem.  Skin: Negative for rash.  All other systems reviewed and are negative.   Per HPI unless specifically indicated above   Allergies as of 07/09/2019      Reactions   Penicillins       Medication List       Accurate as of July 09, 2019  4:01 PM. If you have any questions, ask your nurse or doctor.        STOP taking these medications   alum & mag hydroxide-simeth 200-200-20 MG/5ML suspension Commonly known as: MAALOX/MYLANTA Stopped by: Fransisca Kaufmann Tay Whitwell, MD   cefdinir 300 MG capsule Commonly known as: OMNICEF Stopped by: Worthy Rancher, MD   cetirizine 10 MG tablet Commonly known as: ZYRTEC Stopped by: Worthy Rancher, MD   doxycycline 100 MG tablet Commonly known as: VIBRA-TABS Stopped by: Worthy Rancher, MD   sildenafil 20 MG tablet Commonly known as: REVATIO Stopped by: Worthy Rancher, MD     TAKE these medications   allopurinol 300 MG tablet Commonly known as: ZYLOPRIM Take 1 tablet (300 mg total) by mouth daily.   atenolol 50 MG tablet Commonly known as: TENORMIN Take 1-2 tablets (50-100 mg total) by mouth daily.   atorvastatin 40 MG tablet Commonly known as: LIPITOR Take  1 tablet (40 mg total) by mouth daily.   benazepril 20 MG tablet Commonly known as: LOTENSIN Take 1 tablet (20 mg total) by mouth daily.   clonazePAM 1 MG tablet Commonly known as: KLONOPIN TAKE ONE TABLET BY MOUTH TWICE DAILY AS NEEDED   fluticasone 50 MCG/ACT nasal spray Commonly known as: FLONASE Place 2 sprays into both nostrils daily.   nabumetone 500 MG tablet Commonly known as: RELAFEN Take 1 tablet (500 mg total) by mouth daily.   neomycin-polymyxin-hydrocortisone OTIC solution Commonly known as: CORTISPORIN  Place 3 drops into the left ear 4 (four) times daily for 7 days. Started by: Worthy Rancher, MD   omeprazole 20 MG capsule Commonly known as: PRILOSEC Take 1 capsule (20 mg total) by mouth daily.   tamsulosin 0.4 MG Caps capsule Commonly known as: FLOMAX Take 1 capsule (0.4 mg total) by mouth daily. Started by: Worthy Rancher, MD   venlafaxine XR 75 MG 24 hr capsule Commonly known as: EFFEXOR-XR Take 1 capsule (75 mg total) by mouth daily with breakfast. Take 1 capsule daily        Objective:   BP (!) 154/84   Pulse 64   Temp 98 F (36.7 C) (Temporal)   Ht '5\' 6"'  (1.676 m)   Wt 197 lb 12.8 oz (89.7 kg)   SpO2 98%   BMI 31.93 kg/m   Wt Readings from Last 3 Encounters:  07/09/19 197 lb 12.8 oz (89.7 kg)  01/06/19 190 lb (86.2 kg)  07/03/18 191 lb (86.6 kg)    Physical Exam Vitals signs and nursing note reviewed.  Constitutional:      General: He is not in acute distress.    Appearance: He is well-developed. He is not diaphoretic.  HENT:     Right Ear: Hearing normal. Drainage present. No swelling. There is no impacted cerumen.     Left Ear: Hearing, tympanic membrane and ear canal normal.  Eyes:     General: No scleral icterus.    Conjunctiva/sclera: Conjunctivae normal.  Neck:     Musculoskeletal: Neck supple.     Thyroid: No thyromegaly.  Cardiovascular:     Rate and Rhythm: Normal rate and regular rhythm.     Heart sounds: Normal heart sounds. No murmur.  Pulmonary:     Effort: Pulmonary effort is normal. No respiratory distress.     Breath sounds: Normal breath sounds. No wheezing.  Musculoskeletal: Normal range of motion.  Lymphadenopathy:     Cervical: No cervical adenopathy.  Skin:    General: Skin is warm and dry.     Findings: No rash.  Neurological:     Mental Status: He is alert and oriented to person, place, and time.     Coordination: Coordination normal.  Psychiatric:        Behavior: Behavior normal.       Assessment & Plan:    Problem List Items Addressed This Visit      Cardiovascular and Mediastinum   HTN (hypertension)   Relevant Medications   atenolol (TENORMIN) 50 MG tablet   atorvastatin (LIPITOR) 40 MG tablet   benazepril (LOTENSIN) 20 MG tablet   Other Relevant Orders   CMP14+EGFR     Digestive   GERD (gastroesophageal reflux disease)   Relevant Medications   omeprazole (PRILOSEC) 20 MG capsule   Other Relevant Orders   CBC with Differential/Platelet     Nervous and Auditory   OE (otitis externa)     Other   HLD (  hyperlipidemia)   Relevant Medications   atenolol (TENORMIN) 50 MG tablet   atorvastatin (LIPITOR) 40 MG tablet   benazepril (LOTENSIN) 20 MG tablet   Other Relevant Orders   Lipid panel   Prediabetes - Primary   Relevant Orders   Bayer DCA Hb A1c Waived   Anxiety   Relevant Medications   clonazePAM (KLONOPIN) 1 MG tablet   venlafaxine XR (EFFEXOR-XR) 75 MG 24 hr capsule   Other Relevant Orders   ToxASSURE Select 13 (MW), Urine   CBC with Differential/Platelet    Other Visit Diagnoses    Prostate cancer screening       Relevant Orders   PSA, total and free   Benign prostatic hyperplasia with urinary frequency          We will continue his clonazepam and do a tox assure today, we will continue his other anxiety medication.  I gave him Flomax that he can try for his urinary frequency and gave him some eardrops for his ears of Cortisporin and he will let us know if it does not improve.  Follow up plan: Return in about 6 months (around 01/06/2020), or if symptoms worsen or fail to improve, for Hypertension and cholesterol.  Counseling provided for all of the vaccine components Orders Placed This Encounter  Procedures  . ToxASSURE Select 13 (MW), Urine  . CBC with Differential/Platelet  . CMP14+EGFR  . Lipid panel  . Bayer DCA Hb A1c Waived  . PSA, total and free    Caryl Pina, MD [facility] 07/09/2019, 4:01 PM

## 2019-07-10 LAB — CBC WITH DIFFERENTIAL/PLATELET
Basophils Absolute: 0.1 10*3/uL (ref 0.0–0.2)
Basos: 1 %
EOS (ABSOLUTE): 0.4 10*3/uL (ref 0.0–0.4)
Eos: 4 %
Hematocrit: 43.4 % (ref 37.5–51.0)
Hemoglobin: 15.1 g/dL (ref 13.0–17.7)
Immature Grans (Abs): 0 10*3/uL (ref 0.0–0.1)
Immature Granulocytes: 1 %
Lymphocytes Absolute: 1.9 10*3/uL (ref 0.7–3.1)
Lymphs: 23 %
MCH: 29.5 pg (ref 26.6–33.0)
MCHC: 34.8 g/dL (ref 31.5–35.7)
MCV: 85 fL (ref 79–97)
Monocytes Absolute: 0.7 10*3/uL (ref 0.1–0.9)
Monocytes: 8 %
Neutrophils Absolute: 5.3 10*3/uL (ref 1.4–7.0)
Neutrophils: 63 %
Platelets: 196 10*3/uL (ref 150–450)
RBC: 5.12 x10E6/uL (ref 4.14–5.80)
RDW: 14.5 % (ref 11.6–15.4)
WBC: 8.4 10*3/uL (ref 3.4–10.8)

## 2019-07-10 LAB — CMP14+EGFR
ALT: 18 IU/L (ref 0–44)
AST: 19 IU/L (ref 0–40)
Albumin/Globulin Ratio: 2.1 (ref 1.2–2.2)
Albumin: 4.7 g/dL (ref 3.7–4.7)
Alkaline Phosphatase: 109 IU/L (ref 39–117)
BUN/Creatinine Ratio: 15 (ref 10–24)
BUN: 16 mg/dL (ref 8–27)
Bilirubin Total: 0.5 mg/dL (ref 0.0–1.2)
CO2: 26 mmol/L (ref 20–29)
Calcium: 9.8 mg/dL (ref 8.6–10.2)
Chloride: 97 mmol/L (ref 96–106)
Creatinine, Ser: 1.05 mg/dL (ref 0.76–1.27)
GFR calc Af Amer: 82 mL/min/{1.73_m2} (ref >59)
GFR calc non Af Amer: 71 mL/min/{1.73_m2} (ref >59)
Globulin, Total: 2.2 g/dL (ref 1.5–4.5)
Glucose: 100 mg/dL — ABNORMAL HIGH (ref 65–99)
Potassium: 4.5 mmol/L (ref 3.5–5.2)
Sodium: 139 mmol/L (ref 134–144)
Total Protein: 6.9 g/dL (ref 6.0–8.5)

## 2019-07-10 LAB — LIPID PANEL
Chol/HDL Ratio: 4.1 ratio (ref 0.0–5.0)
Cholesterol, Total: 189 mg/dL (ref 100–199)
HDL: 46 mg/dL (ref >39)
LDL Chol Calc (NIH): 101 mg/dL — ABNORMAL HIGH (ref 0–99)
Triglycerides: 246 mg/dL — ABNORMAL HIGH (ref 0–149)
VLDL Cholesterol Cal: 42 mg/dL — ABNORMAL HIGH (ref 5–40)

## 2019-07-10 LAB — PSA, TOTAL AND FREE
PSA, Free Pct: 35.3 %
PSA, Free: 0.67 ng/mL
Prostate Specific Ag, Serum: 1.9 ng/mL (ref 0.0–4.0)

## 2019-07-11 LAB — TOXASSURE SELECT 13 (MW), URINE

## 2019-08-28 ENCOUNTER — Other Ambulatory Visit: Payer: Self-pay

## 2019-08-29 ENCOUNTER — Ambulatory Visit (INDEPENDENT_AMBULATORY_CARE_PROVIDER_SITE_OTHER): Payer: Medicare Other

## 2019-08-29 DIAGNOSIS — Z23 Encounter for immunization: Secondary | ICD-10-CM

## 2020-01-06 ENCOUNTER — Other Ambulatory Visit: Payer: Self-pay

## 2020-01-07 ENCOUNTER — Encounter: Payer: Self-pay | Admitting: Family Medicine

## 2020-01-07 ENCOUNTER — Ambulatory Visit (INDEPENDENT_AMBULATORY_CARE_PROVIDER_SITE_OTHER): Payer: Medicare Other | Admitting: Family Medicine

## 2020-01-07 VITALS — BP 159/69 | HR 63 | Temp 98.9°F | Ht 66.0 in | Wt 189.0 lb

## 2020-01-07 DIAGNOSIS — R7303 Prediabetes: Secondary | ICD-10-CM

## 2020-01-07 DIAGNOSIS — I10 Essential (primary) hypertension: Secondary | ICD-10-CM | POA: Diagnosis not present

## 2020-01-07 DIAGNOSIS — F419 Anxiety disorder, unspecified: Secondary | ICD-10-CM | POA: Diagnosis not present

## 2020-01-07 LAB — BAYER DCA HB A1C WAIVED: HB A1C (BAYER DCA - WAIVED): 5.9 % (ref <7.0)

## 2020-01-07 MED ORDER — METOPROLOL SUCCINATE ER 50 MG PO TB24: 50.0000 mg | ORAL_TABLET | Freq: Every day | ORAL | 3 refills | Status: DC

## 2020-01-07 MED ORDER — CLONAZEPAM 1 MG PO TABS: ORAL_TABLET | ORAL | 1 refills | Status: DC

## 2020-01-07 NOTE — Progress Notes (Signed)
BP (!) 159/69   Pulse 63   Temp 98.9 F (37.2 C)   Ht '5\' 6"'  (1.676 m)   Wt 189 lb (85.7 kg)   SpO2 98%   BMI 30.51 kg/m    Subjective:   Patient ID: Jonathan Love, male    DOB: 1946-02-17, 74 y.o.   MRN: [id number]  HPI: Jonathan Love is a 74 y.o. male presenting on 01/07/2020 for Medical Management of Chronic Issues (6 month follow up) and Hypertension   HPI Prediabetes Patient comes in today for recheck of his diabetes. Patient has been currently taking no medication currently and will check A1c again.. Patient is currently on an ACE inhibitor/ARB. Patient has not seen an ophthalmologist this year. Patient denies any issues with their feet.   Hypertension Patient is currently on atenolol and benazepril although he says his insurance will not cover atenolol anymore, and their blood pressure today is 159/69. Patient denies any lightheadedness or dizziness. Patient denies headaches, blurred vision, chest pains, shortness of breath, or weakness. Denies any side effects from medication and is content with current medication.   Hyperlipidemia Patient is coming in for recheck of his hyperlipidemia. The patient is currently taking atorvastatin. They deny any issues with myalgias or history of liver damage from it. They deny any focal numbness or weakness or chest pain.   Anxiety Current rx-clonazepam 1 mg twice daily as needed # meds rx-90-day supply, 180 Effectiveness of current meds-works well Adverse reactions form meds-none  Pill count performed-No Last drug screen -07/29/2019 ( high risk q71m moderate risk q66mlow risk yearly ) Urine drug screen today- No [place]  If yes were their any concerning findings? -None  No flowsheet data found.   Controlled substance contract signed on: 07/29/2019  Relevant past medical, surgical, family and social history reviewed and updated as indicated. Interim medical history since our last visit reviewed. Allergies  and medications reviewed and updated.  Review of Systems  Constitutional: Negative for chills and fever.  Eyes: Negative for visual disturbance.  Respiratory: Negative for shortness of breath and wheezing.   Cardiovascular: Negative for chest pain and leg swelling.  Musculoskeletal: Negative for back pain and gait problem.  Skin: Negative for rash.  Neurological: Negative for dizziness, weakness and numbness.  Psychiatric/Behavioral: Negative for dysphoric mood, self-injury, sleep disturbance and suicidal ideas. The patient is nervous/anxious.   All other systems reviewed and are negative.   Per HPI unless specifically indicated above   Allergies as of 01/07/2020      Reactions   Penicillins       Medication List       Accurate as of January 07, 2020  2:06 PM. If you have any questions, ask your nurse or doctor.        allopurinol 300 MG tablet Commonly known as: ZYLOPRIM Take 1 tablet (300 mg total) by mouth daily.   atenolol 50 MG tablet Commonly known as: TENORMIN Take 1-2 tablets (50-100 mg total) by mouth daily.   atorvastatin 40 MG tablet Commonly known as: LIPITOR Take 1 tablet (40 mg total) by mouth daily.   benazepril 20 MG tablet Commonly known as: LOTENSIN Take 1 tablet (20 mg total) by mouth daily.   clonazePAM 1 MG tablet Commonly known as: KLONOPIN TAKE ONE TABLET BY MOUTH TWICE DAILY AS NEEDED   fluticasone 50 MCG/ACT nasal spray Commonly known as: FLONASE Place 2 sprays into both nostrils daily.   nabumetone 500 MG tablet Commonly  known as: RELAFEN Take 1 tablet (500 mg total) by mouth daily.   omeprazole 20 MG capsule Commonly known as: PRILOSEC Take 1 capsule (20 mg total) by mouth daily.   tamsulosin 0.4 MG Caps capsule Commonly known as: FLOMAX Take 1 capsule (0.4 mg total) by mouth daily.   venlafaxine XR 75 MG 24 hr capsule Commonly known as: EFFEXOR-XR Take 1 capsule (75 mg total) by mouth daily with breakfast. Take 1 capsule  daily        Objective:   BP (!) 159/69   Pulse 63   Temp 98.9 F (37.2 C)   Ht '5\' 6"'  (1.676 m)   Wt 189 lb (85.7 kg)   SpO2 98%   BMI 30.51 kg/m   Wt Readings from Last 3 Encounters:  01/07/20 189 lb (85.7 kg)  07/09/19 197 lb 12.8 oz (89.7 kg)  01/06/19 190 lb (86.2 kg)    Physical Exam Vitals and nursing note reviewed.  Constitutional:      General: He is not in acute distress.    Appearance: He is well-developed. He is not diaphoretic.  Eyes:     General: No scleral icterus.    Conjunctiva/sclera: Conjunctivae normal.  Neck:     Thyroid: No thyromegaly.  Cardiovascular:     Rate and Rhythm: Normal rate and regular rhythm.     Heart sounds: Normal heart sounds. No murmur.  Pulmonary:     Effort: Pulmonary effort is normal. No respiratory distress.     Breath sounds: Normal breath sounds. No wheezing.  Musculoskeletal:        General: Normal range of motion.     Cervical back: Neck supple.  Lymphadenopathy:     Cervical: No cervical adenopathy.  Skin:    General: Skin is warm and dry.     Findings: No rash.  Neurological:     Mental Status: He is alert and oriented to person, place, and time.     Coordination: Coordination normal.  Psychiatric:        Behavior: Behavior normal.       Assessment & Plan:   Problem List Items Addressed This Visit      Cardiovascular and Mediastinum   HTN (hypertension) - Primary   Relevant Medications   metoprolol succinate (TOPROL-XL) 50 MG 24 hr tablet   Other Relevant Orders   Lipid panel   CBC with Differential/Platelet     Other   Prediabetes   Relevant Orders   Bayer DCA Hb A1c Waived   Lipid panel   CMP14+EGFR   Anxiety   Relevant Medications   clonazePAM (KLONOPIN) 1 MG tablet      Refill clonazepam, will switch to metoprolol from atenolol because of insurance Follow up plan: Return in about 3 months (around 04/08/2020), or if symptoms worsen or fail to improve, for Recheck prediabetes and  hypertension.  Counseling provided for all of the vaccine components Orders Placed This Encounter  Procedures  . Bayer DCA Hb A1c Waived  . Lipid panel  . CBC with Differential/Platelet  . [place] Alyana Kreiter, MD [facility] 01/07/2020, 2:06 PM

## 2020-01-08 LAB — CMP14+EGFR
ALT: 16 IU/L (ref 0–44)
AST: 16 IU/L (ref 0–40)
Albumin/Globulin Ratio: 1.9 (ref 1.2–2.2)
Albumin: 4.5 g/dL (ref 3.7–4.7)
Alkaline Phosphatase: 123 IU/L — ABNORMAL HIGH (ref 39–117)
BUN/Creatinine Ratio: 15 (ref 10–24)
BUN: 16 mg/dL (ref 8–27)
Bilirubin Total: 0.7 mg/dL (ref 0.0–1.2)
CO2: 27 mmol/L (ref 20–29)
Calcium: 9.7 mg/dL (ref 8.6–10.2)
Chloride: 100 mmol/L (ref 96–106)
Creatinine, Ser: 1.05 mg/dL (ref 0.76–1.27)
GFR calc Af Amer: 81 mL/min/{1.73_m2} (ref >59)
GFR calc non Af Amer: 70 mL/min/{1.73_m2} (ref >59)
Globulin, Total: 2.4 g/dL (ref 1.5–4.5)
Glucose: 90 mg/dL (ref 65–99)
Potassium: 5.2 mmol/L (ref 3.5–5.2)
Sodium: 140 mmol/L (ref 134–144)
Total Protein: 6.9 g/dL (ref 6.0–8.5)

## 2020-01-08 LAB — CBC WITH DIFFERENTIAL/PLATELET
Basophils Absolute: 0.1 10*3/uL (ref 0.0–0.2)
Basos: 1 %
EOS (ABSOLUTE): 0.3 10*3/uL (ref 0.0–0.4)
Eos: 4 %
Hematocrit: 45.4 % (ref 37.5–51.0)
Hemoglobin: 15.3 g/dL (ref 13.0–17.7)
Immature Grans (Abs): 0 10*3/uL (ref 0.0–0.1)
Immature Granulocytes: 0 %
Lymphocytes Absolute: 1.7 10*3/uL (ref 0.7–3.1)
Lymphs: 22 %
MCH: 29.7 pg (ref 26.6–33.0)
MCHC: 33.7 g/dL (ref 31.5–35.7)
MCV: 88 fL (ref 79–97)
Monocytes Absolute: 0.5 10*3/uL (ref 0.1–0.9)
Monocytes: 7 %
Neutrophils Absolute: 5.2 10*3/uL (ref 1.4–7.0)
Neutrophils: 66 %
Platelets: 177 10*3/uL (ref 150–450)
RBC: 5.15 x10E6/uL (ref 4.14–5.80)
RDW: 14.1 % (ref 11.6–15.4)
WBC: 7.9 10*3/uL (ref 3.4–10.8)

## 2020-01-08 LAB — LIPID PANEL
Chol/HDL Ratio: 3.8 ratio (ref 0.0–5.0)
Cholesterol, Total: 173 mg/dL (ref 100–199)
HDL: 46 mg/dL (ref >39)
LDL Chol Calc (NIH): 97 mg/dL (ref 0–99)
Triglycerides: 173 mg/dL — ABNORMAL HIGH (ref 0–149)
VLDL Cholesterol Cal: 30 mg/dL (ref 5–40)

## 2020-01-27 ENCOUNTER — Telehealth: Payer: Self-pay | Admitting: Family Medicine

## 2020-01-27 NOTE — Chronic Care Management (AMB) (Signed)
  Chronic Care Management   Note  01/27/2020 Name: Jonathan Love MRN: [id number] DOB: June 21, 1946  Jonathan Love is a 74 y.o. year old male who is a primary care patient of Dettinger, Fransisca Kaufmann, MD. I reached out to [name] by phone today in response to a referral sent by Jonathan Love's health plan.     Jonathan Love was given information about Chronic Care Management services today including:  1. CCM service includes personalized support from designated clinical staff supervised by his physician, including individualized plan of care and coordination with other care providers 2. 24/7 contact phone numbers for assistance for urgent and routine care needs. 3. Service will only be billed when office clinical staff spend 20 minutes or more in a month to coordinate care. 4. Only one practitioner may furnish and bill the service in a calendar month. 5. The patient may stop CCM services at any time (effective at the end of the month) by phone call to the office staff. 6. The patient will be responsible for cost sharing (co-pay) of up to 20% of the service fee (after annual deductible is met).  Patient did not agree to enrollment in care management services and does not wish to consider at this time.  Follow up plan: The patient has been provided with contact information for the care management team and has been advised to call with any health related questions or concerns.   [place], [place] [address] Direct Dial: [phone] [email or web address] Website: [email or web address]

## 2020-01-27 NOTE — Chronic Care Management (AMB) (Signed)
  Chronic Care Management   Outreach Note  01/27/2020 Name: Jonathan Love MRN: [id number] DOB: 05-22-1946  Jonathan Love is a 74 y.o. year old male who is a primary care patient of Dettinger, Fransisca Kaufmann, MD. I reached out to Jonathan Love by phone today in response to a referral sent by [name]'s health plan.     An unsuccessful telephone outreach was attempted today. The patient was referred to the case management team for assistance with care management and care coordination.   Follow Up Plan: A HIPPA compliant phone message was left for the patient providing contact information and requesting a return call. The care management team will reach out to the patient again over the next 7 days.If patient returns call to provider office, please advise to call [facility] at [phone].  [place], [place] [address] Direct Dial: [phone] [email or web address]: [email or web address]

## 2020-04-28 DIAGNOSIS — C44329 Squamous cell carcinoma of skin of other parts of face: Secondary | ICD-10-CM | POA: Diagnosis not present

## 2020-04-28 DIAGNOSIS — D485 Neoplasm of uncertain behavior of skin: Secondary | ICD-10-CM | POA: Diagnosis not present

## 2020-04-28 DIAGNOSIS — L819 Disorder of pigmentation, unspecified: Secondary | ICD-10-CM | POA: Diagnosis not present

## 2020-04-29 DIAGNOSIS — C439 Malignant melanoma of skin, unspecified: Secondary | ICD-10-CM

## 2020-04-29 HISTORY — DX: Malignant melanoma of skin, unspecified: C43.9

## 2020-05-07 DIAGNOSIS — C44329 Squamous cell carcinoma of skin of other parts of face: Secondary | ICD-10-CM | POA: Diagnosis not present

## 2020-05-30 DIAGNOSIS — H04123 Dry eye syndrome of bilateral lacrimal glands: Secondary | ICD-10-CM | POA: Diagnosis not present

## 2020-06-01 ENCOUNTER — Other Ambulatory Visit: Payer: Self-pay | Admitting: Family Medicine

## 2020-06-01 DIAGNOSIS — E7800 Pure hypercholesterolemia, unspecified: Secondary | ICD-10-CM

## 2020-06-19 DIAGNOSIS — H5213 Myopia, bilateral: Secondary | ICD-10-CM | POA: Diagnosis not present

## 2020-06-26 ENCOUNTER — Other Ambulatory Visit: Payer: Self-pay | Admitting: Family Medicine

## 2020-06-26 DIAGNOSIS — K219 Gastro-esophageal reflux disease without esophagitis: Secondary | ICD-10-CM

## 2020-07-09 ENCOUNTER — Encounter: Payer: Self-pay | Admitting: Family Medicine

## 2020-07-09 ENCOUNTER — Ambulatory Visit (INDEPENDENT_AMBULATORY_CARE_PROVIDER_SITE_OTHER): Payer: Medicare Other | Admitting: Family Medicine

## 2020-07-09 ENCOUNTER — Other Ambulatory Visit: Payer: Self-pay

## 2020-07-09 VITALS — HR 58 | Temp 97.3°F | Ht 66.0 in | Wt 180.0 lb

## 2020-07-09 DIAGNOSIS — K219 Gastro-esophageal reflux disease without esophagitis: Secondary | ICD-10-CM | POA: Diagnosis not present

## 2020-07-09 DIAGNOSIS — R7303 Prediabetes: Secondary | ICD-10-CM

## 2020-07-09 DIAGNOSIS — Z79891 Long term (current) use of opiate analgesic: Secondary | ICD-10-CM | POA: Diagnosis not present

## 2020-07-09 DIAGNOSIS — E7800 Pure hypercholesterolemia, unspecified: Secondary | ICD-10-CM | POA: Diagnosis not present

## 2020-07-09 DIAGNOSIS — I10 Essential (primary) hypertension: Secondary | ICD-10-CM

## 2020-07-09 DIAGNOSIS — F419 Anxiety disorder, unspecified: Secondary | ICD-10-CM

## 2020-07-09 LAB — BAYER DCA HB A1C WAIVED: HB A1C (BAYER DCA - WAIVED): 5.9 % (ref <7.0)

## 2020-07-09 MED ORDER — VENLAFAXINE HCL ER 150 MG PO CP24: 150.0000 mg | ORAL_CAPSULE | Freq: Every day | ORAL | 3 refills | Status: DC

## 2020-07-09 MED ORDER — CLONAZEPAM 1 MG PO TABS: ORAL_TABLET | ORAL | 1 refills | Status: DC

## 2020-07-09 NOTE — Addendum Note (Signed)
Addended by: Caryl Pina on: 07/09/2020 01:42 PM   Modules accepted: Orders

## 2020-07-09 NOTE — Progress Notes (Signed)
Pulse (!) 58   Temp (!) 97.3 F (36.3 C)   Ht '5\' 6"'  (1.676 m)   Wt 180 lb (81.6 kg)   SpO2 97%   BMI 29.05 kg/m    Subjective:   Patient ID: Jonathan Love, male    DOB: 09/27/1946, 74 y.o.   MRN: [id number]  HPI: Jonathan Love is a 74 y.o. male presenting on 07/09/2020 for Medical Management of Chronic Issues and Hypertension   HPI Prediabetes Patient comes in today for recheck of his diabetes. Patient has been currently taking no medication currently. Patient is currently on an ACE inhibitor/ARB. Patient has not seen an ophthalmologist this year. Patient denies any issues with their feet. The symptom started onset as an adult hypertension and hyperlipidemia ARE RELATED TO DM   Hypertension Patient is currently on benazepril and metoprolol,, and their blood pressure today is 157/81 and 149/80. Patient denies any lightheadedness or dizziness. Patient denies headaches, blurred vision, chest pains, shortness of breath, or weakness. Denies any side effects from medication and is content with current medication.   Hyperlipidemia Patient is coming in for recheck of his hyperlipidemia. The patient is currently taking Lipitor. They deny any issues with myalgias or history of liver damage from it. They deny any focal numbness or weakness or chest pain.   GERD Patient is currently on omeprazole.  She denies any major symptoms or abdominal pain or belching or burping. She denies any blood in her stool or lightheadedness or dizziness.   Anxiety Patient is coming in for anxiety depression recheck.  Says he has been having a lot more anxiety and stress and tremors and feels like it is been building up.  He feels like his Effexor and clonazepam that he has currently are not sufficient to help with this.  He says he is building up to at least a few times a week where he is having anxiety over swelling and then is also worried that his blood pressure may be up because of the anxiety as  well.  Relevant past medical, surgical, family and social history reviewed and updated as indicated. Interim medical history since our last visit reviewed. Allergies and medications reviewed and updated.  Review of Systems  Constitutional: Negative for chills and fever.  Eyes: Negative for visual disturbance.  Respiratory: Negative for shortness of breath and wheezing.   Cardiovascular: Negative for chest pain and leg swelling.  Musculoskeletal: Negative for back pain and gait problem.  Skin: Negative for rash.  Psychiatric/Behavioral: Positive for dysphoric mood. Negative for self-injury, sleep disturbance and suicidal ideas. The patient is nervous/anxious.   All other systems reviewed and are negative.   Per HPI unless specifically indicated above   Allergies as of 07/09/2020      Reactions   Penicillins       Medication List       Accurate as of July 09, 2020  1:14 PM. If you have any questions, ask your nurse or doctor.        allopurinol 300 MG tablet Commonly known as: ZYLOPRIM TAKE 1 TABLET BY MOUTH  DAILY   atorvastatin 40 MG tablet Commonly known as: LIPITOR TAKE 1 TABLET BY MOUTH  DAILY   benazepril 20 MG tablet Commonly known as: LOTENSIN TAKE 1 TABLET BY MOUTH  DAILY   clonazePAM 1 MG tablet Commonly known as: KLONOPIN TAKE ONE TABLET BY MOUTH TWICE DAILY AS NEEDED   fluticasone 50 MCG/ACT nasal spray Commonly known as: FLONASE Place 2  sprays into both nostrils daily.   metoprolol succinate 50 MG 24 hr tablet Commonly known as: TOPROL-XL Take 1 tablet (50 mg total) by mouth daily. Take with or immediately following a meal.   nabumetone 500 MG tablet Commonly known as: RELAFEN TAKE 1 TABLET BY MOUTH  DAILY   omeprazole 20 MG capsule Commonly known as: PRILOSEC TAKE 1 CAPSULE BY MOUTH  DAILY   tamsulosin 0.4 MG Caps capsule Commonly known as: FLOMAX TAKE 1 CAPSULE BY MOUTH  DAILY   venlafaxine XR 75 MG 24 hr capsule Commonly known as:  EFFEXOR-XR TAKE 1 CAPSULE BY MOUTH  DAILY WITH BREAKFAST        Objective:   Pulse (!) 58   Temp (!) 97.3 F (36.3 C)   Ht '5\' 6"'  (1.676 m)   Wt 180 lb (81.6 kg)   SpO2 97%   BMI 29.05 kg/m   Wt Readings from Last 3 Encounters:  07/09/20 180 lb (81.6 kg)  01/07/20 189 lb (85.7 kg)  07/09/19 197 lb 12.8 oz (89.7 kg)    Physical Exam Vitals and nursing note reviewed.  Constitutional:      General: He is not in acute distress.    Appearance: He is well-developed. He is not diaphoretic.  Eyes:     General: No scleral icterus.    Conjunctiva/sclera: Conjunctivae normal.  Neck:     Thyroid: No thyromegaly.  Cardiovascular:     Rate and Rhythm: Normal rate and regular rhythm.     Heart sounds: Normal heart sounds. No murmur heard.   Pulmonary:     Effort: Pulmonary effort is normal. No respiratory distress.     Breath sounds: Normal breath sounds. No wheezing.  Musculoskeletal:        General: Normal range of motion.     Cervical back: Neck supple.  Lymphadenopathy:     Cervical: No cervical adenopathy.  Skin:    General: Skin is warm and dry.     Findings: No rash.  Neurological:     Mental Status: He is alert and oriented to person, place, and time.     Coordination: Coordination normal.  Psychiatric:        Mood and Affect: Mood is anxious and depressed.        Behavior: Behavior normal.       Assessment & Plan:   Problem List Items Addressed This Visit      Cardiovascular and Mediastinum   HTN (hypertension)   Relevant Orders   CMP14+EGFR     Digestive   GERD (gastroesophageal reflux disease)   Relevant Orders   CBC with Differential/Platelet     Other   HLD (hyperlipidemia)   Relevant Orders   Lipid panel   Prediabetes - Primary   Relevant Orders   Bayer DCA Hb A1c Waived   Anxiety   Relevant Medications   venlafaxine XR (EFFEXOR-XR) 150 MG 24 hr capsule   Other Relevant Orders   ToxASSURE Select 13 (MW), Urine    Will increase  Effexor, keep blood pressure medicine the same for now.  Will monitor closely and see back in a month or 2.  Follow up plan: Return if symptoms worsen or fail to improve, for 1 to 2 months anxiety and hypertension recheck.  Counseling provided for all of the vaccine components No orders of the defined types were placed in this encounter.   Caryl Pina, MD [facility] 07/09/2020, 1:14 PM

## 2020-07-10 LAB — CMP14+EGFR
ALT: 13 IU/L (ref 0–44)
AST: 14 IU/L (ref 0–40)
Albumin/Globulin Ratio: 1.7 (ref 1.2–2.2)
Albumin: 4.3 g/dL (ref 3.7–4.7)
Alkaline Phosphatase: 134 IU/L — ABNORMAL HIGH (ref 48–121)
BUN/Creatinine Ratio: 14 (ref 10–24)
BUN: 14 mg/dL (ref 8–27)
Bilirubin Total: 0.5 mg/dL (ref 0.0–1.2)
CO2: 27 mmol/L (ref 20–29)
Calcium: 9.1 mg/dL (ref 8.6–10.2)
Chloride: 102 mmol/L (ref 96–106)
Creatinine, Ser: 1.03 mg/dL (ref 0.76–1.27)
GFR calc Af Amer: 83 mL/min/{1.73_m2} (ref >59)
GFR calc non Af Amer: 72 mL/min/{1.73_m2} (ref >59)
Globulin, Total: 2.5 g/dL (ref 1.5–4.5)
Glucose: 99 mg/dL (ref 65–99)
Potassium: 4.7 mmol/L (ref 3.5–5.2)
Sodium: 141 mmol/L (ref 134–144)
Total Protein: 6.8 g/dL (ref 6.0–8.5)

## 2020-07-10 LAB — CBC WITH DIFFERENTIAL/PLATELET
Basophils Absolute: 0.1 10*3/uL (ref 0.0–0.2)
Basos: 1 %
EOS (ABSOLUTE): 0.4 10*3/uL (ref 0.0–0.4)
Eos: 5 %
Hematocrit: 44.4 % (ref 37.5–51.0)
Hemoglobin: 14.7 g/dL (ref 13.0–17.7)
Immature Grans (Abs): 0 10*3/uL (ref 0.0–0.1)
Immature Granulocytes: 1 %
Lymphocytes Absolute: 1.5 10*3/uL (ref 0.7–3.1)
Lymphs: 19 %
MCH: 29.2 pg (ref 26.6–33.0)
MCHC: 33.1 g/dL (ref 31.5–35.7)
MCV: 88 fL (ref 79–97)
Monocytes Absolute: 0.5 10*3/uL (ref 0.1–0.9)
Monocytes: 6 %
Neutrophils Absolute: 5.4 10*3/uL (ref 1.4–7.0)
Neutrophils: 68 %
Platelets: 182 10*3/uL (ref 150–450)
RBC: 5.04 x10E6/uL (ref 4.14–5.80)
RDW: 14 % (ref 11.6–15.4)
WBC: 8 10*3/uL (ref 3.4–10.8)

## 2020-07-10 LAB — LIPID PANEL
Chol/HDL Ratio: 3.6 ratio (ref 0.0–5.0)
Cholesterol, Total: 151 mg/dL (ref 100–199)
HDL: 42 mg/dL (ref >39)
LDL Chol Calc (NIH): 74 mg/dL (ref 0–99)
Triglycerides: 214 mg/dL — ABNORMAL HIGH (ref 0–149)
VLDL Cholesterol Cal: 35 mg/dL (ref 5–40)

## 2020-07-14 LAB — TOXASSURE SELECT 13 (MW), URINE

## 2020-07-14 NOTE — Progress Notes (Signed)
lmtcb

## 2020-08-08 ENCOUNTER — Other Ambulatory Visit: Payer: Self-pay | Admitting: Family Medicine

## 2020-08-08 DIAGNOSIS — E7800 Pure hypercholesterolemia, unspecified: Secondary | ICD-10-CM

## 2020-08-31 ENCOUNTER — Ambulatory Visit (INDEPENDENT_AMBULATORY_CARE_PROVIDER_SITE_OTHER): Payer: Medicare Other

## 2020-08-31 ENCOUNTER — Other Ambulatory Visit: Payer: Self-pay

## 2020-08-31 DIAGNOSIS — Z23 Encounter for immunization: Secondary | ICD-10-CM | POA: Diagnosis not present

## 2020-09-01 ENCOUNTER — Ambulatory Visit (INDEPENDENT_AMBULATORY_CARE_PROVIDER_SITE_OTHER): Payer: Medicare Other | Admitting: Family Medicine

## 2020-09-01 ENCOUNTER — Encounter: Payer: Self-pay | Admitting: Family Medicine

## 2020-09-01 VITALS — BP 169/92 | HR 81 | Temp 97.7°F | Ht 66.0 in | Wt 188.0 lb

## 2020-09-01 DIAGNOSIS — R2689 Other abnormalities of gait and mobility: Secondary | ICD-10-CM | POA: Diagnosis not present

## 2020-09-01 DIAGNOSIS — R29898 Other symptoms and signs involving the musculoskeletal system: Secondary | ICD-10-CM

## 2020-09-01 DIAGNOSIS — F419 Anxiety disorder, unspecified: Secondary | ICD-10-CM | POA: Diagnosis not present

## 2020-09-01 DIAGNOSIS — Z9181 History of falling: Secondary | ICD-10-CM | POA: Diagnosis not present

## 2020-09-01 MED ORDER — PAROXETINE HCL 20 MG PO TABS: ORAL_TABLET | ORAL | 1 refills | Status: DC

## 2020-09-01 NOTE — Patient Instructions (Signed)
Recommended taking Effexor 75 mg for a week and then taking it every other day for a week.  Start Paxil at once a day 20 mg 1 week from now and then increase 2 weeks from now to 40 mg or 2 tablets daily

## 2020-09-01 NOTE — Progress Notes (Signed)
BP (!) 169/92   Pulse 81   Temp 97.7 F (36.5 C)   Ht 5\' 6"  (1.676 m)   Wt 188 lb (85.3 kg)   SpO2 96%   BMI 30.34 kg/m    Subjective:   Patient ID: Jonathan Love, male    DOB: 30-May-1946, 74 y.o.   MRN: [id number]  HPI: Jonathan Love is a 74 y.o. male presenting on 09/01/2020 for Medical Management of Chronic Issues and Anxiety   HPI Anxiety Patient is coming in because he still having issues with anxiety.  We had tried to increase the Effexor but he does not feel like it made a difference.  He also has been taking the clonazepam.  He says he is not anxious but his nerves are shot and we think that is why his blood pressure is elevated as well.  Patient still has 1 refill on his clonazepam.  Patient complains of feelings of weakness in both of his legs where he is giving out sometimes and has had some falls, he had one big fall and he has some soreness in his back now because of it.  He just says his legs gave out and were weak.  He was not having any pain or issues before.  He says he had one more fall like this since that time.  Patient has a small amount of lower back pain from the fall but denies any other pain today and does not feel weak today but says it comes and goes.  Relevant past medical, surgical, family and social history reviewed and updated as indicated. Interim medical history since our last visit reviewed. Allergies and medications reviewed and updated.  Review of Systems  Constitutional: Negative for chills and fever.  Eyes: Negative for visual disturbance.  Respiratory: Negative for shortness of breath and wheezing.   Cardiovascular: Negative for chest pain and leg swelling.  Musculoskeletal: Negative for back pain and gait problem.  Skin: Negative for rash.  Neurological: Positive for weakness. Negative for dizziness and light-headedness.  Psychiatric/Behavioral: Negative for dysphoric mood, self-injury, sleep disturbance and suicidal ideas. The patient is  nervous/anxious.   All other systems reviewed and are negative.   Per HPI unless specifically indicated above   Allergies as of 09/01/2020      Reactions   Penicillins       Medication List       Accurate as of September 01, 2020  1:55 PM. If you have any questions, ask your nurse or doctor.        STOP taking these medications   venlafaxine XR 150 MG 24 hr capsule Commonly known as: EFFEXOR-XR Stopped by: Fransisca Kaufmann Nial Hawe, MD     TAKE these medications   allopurinol 300 MG tablet Commonly known as: ZYLOPRIM TAKE 1 TABLET BY MOUTH  DAILY   atorvastatin 40 MG tablet Commonly known as: LIPITOR TAKE 1 TABLET BY MOUTH  DAILY   benazepril 20 MG tablet Commonly known as: LOTENSIN TAKE 1 TABLET BY MOUTH  DAILY   clonazePAM 1 MG tablet Commonly known as: KLONOPIN TAKE ONE TABLET BY MOUTH TWICE DAILY AS NEEDED   fluticasone 50 MCG/ACT nasal spray Commonly known as: FLONASE Place 2 sprays into both nostrils daily.   metoprolol succinate 50 MG 24 hr tablet Commonly known as: TOPROL-XL Take 1 tablet (50 mg total) by mouth daily. Take with or immediately following a meal.   nabumetone 500 MG tablet Commonly known as: RELAFEN TAKE 1 TABLET BY  MOUTH  DAILY   omeprazole 20 MG capsule Commonly known as: PRILOSEC TAKE 1 CAPSULE BY MOUTH  DAILY   PARoxetine 20 MG tablet Commonly known as: Paxil Take 1 tablet (20 mg total) by mouth daily for 7 days, THEN 2 tablets (40 mg total) daily. Start taking on: September 01, 2020 Started by: Fransisca Kaufmann Britlee Skolnik, MD   tamsulosin 0.4 MG Caps capsule Commonly known as: FLOMAX TAKE 1 CAPSULE BY MOUTH  DAILY        Objective:   BP (!) 169/92   Pulse 81   Temp 97.7 F (36.5 C)   Ht 5\' 6"  (1.676 m)   Wt 188 lb (85.3 kg)   SpO2 96%   BMI 30.34 kg/m   Wt Readings from Last 3 Encounters:  09/01/20 188 lb (85.3 kg)  07/09/20 180 lb (81.6 kg)  01/07/20 189 lb (85.7 kg)    Physical Exam Vitals and nursing note reviewed.    Constitutional:      General: He is not in acute distress.    Appearance: He is well-developed. He is not diaphoretic.  Eyes:     General: No scleral icterus.    Conjunctiva/sclera: Conjunctivae normal.  Neck:     Thyroid: No thyromegaly.  Cardiovascular:     Rate and Rhythm: Normal rate and regular rhythm.     Heart sounds: Normal heart sounds. No murmur heard.   Pulmonary:     Effort: Pulmonary effort is normal. No respiratory distress.     Breath sounds: Normal breath sounds. No wheezing.  Musculoskeletal:     Cervical back: Neck supple.  Lymphadenopathy:     Cervical: No cervical adenopathy.  Skin:    General: Skin is warm and dry.     Findings: No rash.  Neurological:     Mental Status: He is alert and oriented to person, place, and time.     Coordination: Coordination normal.     Gait: Gait abnormal (Slowed gait).  Psychiatric:        Behavior: Behavior normal.       Assessment & Plan:   Problem List Items Addressed This Visit      Other   Anxiety - Primary   Relevant Medications   PARoxetine (PAXIL) 20 MG tablet    Other Visit Diagnoses    At high risk for falls       Relevant Orders   Ambulatory referral to Physical Therapy   Weakness of both lower extremities       Relevant Orders   Ambulatory referral to Physical Therapy   Balance disorder       Relevant Orders   Ambulatory referral to Physical Therapy    Will taper off Effexor and start Paxil, refer to physical therapy because of weakness.  Follow up plan: Return in about 4 weeks (around 09/29/2020), or if symptoms worsen or fail to improve, for Hypertension and anxiety.  Counseling provided for all of the vaccine components Orders Placed This Encounter  Procedures  . Ambulatory referral to Physical Therapy    Caryl Pina, MD [facility] 09/01/2020, 1:55 PM

## 2020-09-14 ENCOUNTER — Ambulatory Visit: Payer: Medicare Other | Attending: Family Medicine | Admitting: Physical Therapy

## 2020-09-14 ENCOUNTER — Other Ambulatory Visit: Payer: Self-pay

## 2020-09-14 ENCOUNTER — Encounter: Payer: Self-pay | Admitting: Physical Therapy

## 2020-09-14 DIAGNOSIS — R296 Repeated falls: Secondary | ICD-10-CM | POA: Diagnosis not present

## 2020-09-14 DIAGNOSIS — R2681 Unsteadiness on feet: Secondary | ICD-10-CM | POA: Diagnosis not present

## 2020-09-14 DIAGNOSIS — M6281 Muscle weakness (generalized): Secondary | ICD-10-CM | POA: Diagnosis not present

## 2020-09-14 NOTE — Therapy (Signed)
[facility] [place], [place], [address] Phone: [phone]   Fax:  [phone]  Physical Therapy Evaluation  Patient Details  Name: Jonathan Love MRN: [id number] Date of Birth: [birth year] Referring Provider (PT): Caryl Pina, MD   Encounter Date: 09/14/2020   PT End of Session - 09/14/20 2009    Visit Number 1    Number of Visits 12    Date for PT Re-Evaluation 11/02/20    Authorization Type [employer] Medicare ([place] modifier); Progress note every 10th visit    PT Start Time 1430    PT Stop Time 1516    PT Time Calculation (min) 46 min    Equipment Utilized During Treatment Gait belt    Activity Tolerance Patient tolerated treatment well    Behavior During Therapy WFL for tasks assessed/performed           Past Medical History:  Diagnosis Date  . Anxiety   . Cataract   . CTS (carpal tunnel syndrome)    left  . Diverticulitis   . GERD (gastroesophageal reflux disease)   . Gout   . 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 Hyperlipidemia   . Hypertension   . Melanoma ([place]) 04/2020   right cheek  . Migraine   . OAB (overactive bladder)   . Prediabetes     Past Surgical History:  Procedure Laterality Date  . EYE SURGERY Left 08/24/2016   retinal - dr Rodman Key = laser eye  . MELANOMA EXCISION Right    cheek  . right lateral epicondylitis    . vocal cord nodule      There were no vitals filed for this visit.    Subjective Assessment - 09/14/20 2019    Subjective COVID-19 screening performed upon arrival. Patient arrives to physical therapy with reports of weakness, falls, and bilateral LE numbness that began about 3 months ago. Patient reports his legs would both go numb and he would fall, mainly backwards. Patient states he is able to get up on his own but with increased time. Patient reports his most recent fall occurred about one weeks ago. Patient reports he has a cane for when he feels really weak  but doesn't like to use it. Patient has been having trouble with managing his high blood pressure with medication and has been experiencing bilateral low back pain  and  expressed he is not sure if those are contribuiting to his weakness. Patient's goals are to decrease pain, improve movement, stand longer, and return to recreational activities.    Pertinent History HTN, GERD, anxiety, migrane    Limitations Standing;House hold activities;Walking    How long can you sit comfortably? Varies    How long can you stand comfortably? Varies    How long can you walk comfortably? varies    Diagnostic tests none    Patient Stated Goals improve balance.    Currently in Pain? Yes    Pain Score 5     Pain Location Back    Pain Orientation Lower    Pain Descriptors / Indicators Numbness    Pain Type Chronic pain    Pain Radiating Towards bilateral LEs to toes    Pain Onset More than a month ago    Pain Frequency Intermittent    Aggravating Factors  walking    Pain Relieving Factors sitting    Effect of Pain on Daily Activities fall risk  [facility] PT Assessment - 09/14/20 0001      Assessment   Medical Diagnosis At high risk for falls, weakness of both lower extremities, balance disorder    Referring Provider (PT) Caryl Pina, MD    Onset Date/Surgical Date --   3 months ago   Next MD Visit 09/29/2020    Prior Therapy no      Precautions   Precautions Fall      Restrictions   Weight Bearing Restrictions No      Balance Screen   Has the patient fallen in the past 6 months Yes    How many times? 10    Has the patient had a decrease in activity level because of a fear of falling?  Yes    Is the patient reluctant to leave their home because of a fear of falling?  Yes      [place] Private residence    Living Arrangements Spouse/significant other      Prior Function   Level of Independence Independent with basic ADLs      Deep Tendon  Reflexes   DTR Assessment Site Patella    Patella DTR 1+   bilaterally     ROM / Strength   AROM / PROM / Strength Strength      Strength   Overall Strength Comments hip abduction and adduction in sitting    Strength Assessment Site Hip;Knee    Right/Left Hip Right;Left    Right Hip Flexion 4-/5    Right Hip ABduction 3+/5    Left Hip Flexion 4/5    Left Hip ABduction 3+/5    Right/Left Knee Right;Left    Right Knee Flexion 4/5    Right Knee Extension 4+/5    Left Knee Flexion 4/5    Left Knee Extension 4+/5      Transfers   Comments UE support required for sit<>stand transfers      Ambulation/Gait   Gait Pattern Step-through pattern;Decreased stride length;Wide base of support      Balance   Balance Assessed Yes      Standardized Balance Assessment   Standardized Balance Assessment Five Times Sit to Stand;Berg Balance Test    Five times sit to stand comments  with UE support 15.83 seconds      Berg Balance Test   Sit to Stand Able to stand  independently using hands    Standing Unsupported Able to stand 2 minutes with supervision    Sitting with Back Unsupported but Feet Supported on Floor or Stool Able to sit safely and securely 2 minutes    Stand to Sit Controls descent by using hands    Transfers Able to transfer safely, definite need of hands    Standing Unsupported with Eyes Closed Able to stand 10 seconds with supervision    Standing Unsupported with Feet Together Able to place feet together independently and stand for 1 minute with supervision    From Standing, Reach Forward with Outstretched Arm Can reach confidently >25 cm (10")    From Standing Position, Pick up Object from Floor Able to pick up shoe, needs supervision    From Standing Position, Turn to Look Behind Over each Shoulder Turn sideways only but maintains balance    Turn 360 Degrees Able to turn 360 degrees safely one side only in 4 seconds or less    Standing Unsupported, Alternately Place Feet on  Step/Stool Able to stand independently and complete 8 steps >20 seconds  Standing Unsupported, One Foot in Front Needs help to step but can hold 15 seconds    Standing on One Leg Tries to lift leg/unable to hold 3 seconds but remains standing independently    Total Score 39    Berg comment: moderate fall risk                      Objective measurements completed on examination: See above findings.               PT Education - 09/14/20 2008    Education Details Heel toe raises, alternate UE/LE marching, seated clam with red theraband, pillow squeeze    Person(s) Educated Patient    Methods Explanation;Demonstration;Handout    Comprehension Returned demonstration;Verbalized understanding               PT Long Term Goals - 09/14/20 2035      PT LONG TERM GOAL #1   Title Patient will be independent with HEP and its progression.    Time 6    Period Weeks    Status New      PT LONG TERM GOAL #2   Title Patient will demonstrate 4+/5  bilateral LEMMT to improve stability during functional tasks.    Time 6    Period Weeks    Status New      PT LONG TERM GOAL #3   Title Patient will decrease risk of falls as noted by the ability to perform modified 5x sit to stand test with bilateral UE support in 12 seconds or less    Time 6    Period Weeks    Status New      PT LONG TERM GOAL #4   Title Patient will decrease risk of falls as noted by a 3+ improvement on Berg Balance Score to 42/56.    Time 6    Period Weeks    Status New                  Plan - 09/14/20 2011    Clinical Impression Statement Patient is a 74 year old male who presents to physical therapy with bilateral LE weakness and decreased balance that has been progressively worsening over the past 3 months. Patient's modified 5x sit to stand time of 15.83 seconds catergorizes him as a fall risk with decreased functional strength. Patient's 39/56 Berg Balance Score categorizes him as a  moderate fall risk. Patient and PT discussed plan of care and discussed HEP to which patient reported understanding. Patient would benefit from skilled physical therapy to address deficits and address patient's goals.    Personal Factors and Comorbidities Age;Comorbidity 3+;Time since onset of injury/illness/exacerbation    Comorbidities HTN, GERD, anxiety, migrane    Examination-Activity Limitations Stand;Stairs;Locomotion Level;Transfers    Stability/Clinical Decision Making Evolving/Moderate complexity    Clinical Decision Making Moderate    Rehab Potential Fair    PT Frequency 2x / week    PT Duration 6 weeks    PT Treatment/Interventions ADLs/Self Care Home Management;Gait training;Stair training;Functional mobility training;Therapeutic activities;Therapeutic exercise;Balance training;Neuromuscular re-education;Manual techniques;Passive range of motion;Patient/family education    PT Next Visit Plan PLEASE ASSESS BLOOD PRESSURE PRIOR TO STARTING THERAPY. nustep, LE strengthening, balance activities in various positions    PT Home Exercise Plan see patient education section    Consulted and Agree with Plan of Care Patient           Patient will benefit from skilled therapeutic intervention in  order to improve the following deficits and impairments:  Decreased activity tolerance, Decreased balance, Decreased strength, Difficulty walking, Abnormal gait, Pain  Visit Diagnosis: Repeated falls  Unsteadiness on feet  Muscle weakness (generalized)     Problem List Patient Active Problem List   Diagnosis Date Noted  . Erectile dysfunction 02/19/2014  . OE (otitis externa) 08/12/2013  . GERD (gastroesophageal reflux disease)   . Migraine   . CTS (carpal tunnel syndrome)   . 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 OAB (overactive bladder)   . Anxiety   . HTN (hypertension) 04/09/2013  . HLD (hyperlipidemia) 04/09/2013  . Prediabetes 04/09/2013  . Gout 04/09/2013    Cherrie Distance,  DPT 09/14/2020, 8:41 PM  [facility] [address] [place], [place], [address] Phone: [phone]   Fax:  [phone]  Name: Jonathan Love MRN: [id number] Date of Birth: Sep 28, 1946

## 2020-09-15 ENCOUNTER — Other Ambulatory Visit: Payer: Self-pay | Admitting: Family Medicine

## 2020-09-15 DIAGNOSIS — K219 Gastro-esophageal reflux disease without esophagitis: Secondary | ICD-10-CM

## 2020-09-22 ENCOUNTER — Ambulatory Visit: Payer: Medicare Other | Admitting: Physical Therapy

## 2020-09-24 ENCOUNTER — Other Ambulatory Visit: Payer: Self-pay | Admitting: Family Medicine

## 2020-09-29 ENCOUNTER — Encounter: Payer: Self-pay | Admitting: Family Medicine

## 2020-09-29 ENCOUNTER — Other Ambulatory Visit: Payer: Self-pay

## 2020-09-29 ENCOUNTER — Ambulatory Visit (INDEPENDENT_AMBULATORY_CARE_PROVIDER_SITE_OTHER): Payer: Medicare Other | Admitting: Family Medicine

## 2020-09-29 VITALS — BP 140/70 | HR 66 | Temp 98.0°F | Ht 66.0 in | Wt 192.0 lb

## 2020-09-29 DIAGNOSIS — G629 Polyneuropathy, unspecified: Secondary | ICD-10-CM

## 2020-09-29 DIAGNOSIS — G4489 Other headache syndrome: Secondary | ICD-10-CM | POA: Diagnosis not present

## 2020-09-29 DIAGNOSIS — F419 Anxiety disorder, unspecified: Secondary | ICD-10-CM

## 2020-09-29 DIAGNOSIS — I10 Essential (primary) hypertension: Secondary | ICD-10-CM

## 2020-09-29 NOTE — Progress Notes (Signed)
BP 140/70   Pulse 66   Temp 98 F (36.7 C)   Ht 5\' 6"  (1.676 m)   Wt 192 lb (87.1 kg)   SpO2 98%   BMI 30.99 kg/m    Subjective:   Patient ID: Jonathan Love, male    DOB: 10-Dec-1945, 74 y.o.   MRN: [id number]  HPI: Jonathan Love is a 74 y.o. male presenting on 09/29/2020 for Medical Management of Chronic Issues and Hypertension   HPI Patient is coming in today for anxiety recheck.  He was switched to Paxil several help the morning since he does not notice a difference.  He still takes his clonazepam although it does not look like he is taking it as frequently, he has only refilled once in September.  Patient was on Cymbalta previously.  Hypertension Patient is currently on benazepril and metoprolol, and their blood pressure today is 140/70, he says it runs higher at home and down at home as well.  He has had a couple of falls recently.. Patient denies any lightheadedness or dizziness. Patient denies headaches, blurred vision, chest pains, shortness of breath, or weakness. Denies any side effects from medication and is content with current medication.   Patient also complains of his headaches increasing recently and some weird numbness feelings in his legs that come with different situations including sitting and walking.  One time he went numb and weak in one leg and fell because of it.  He says the numbness and weakness does not typically last more than 5 minutes.  His headaches have been daily in the afternoon.  Relevant past medical, surgical, family and social history reviewed and updated as indicated. Interim medical history since our last visit reviewed. Allergies and medications reviewed and updated.  Review of Systems  Constitutional: Negative for chills and fever.  Eyes: Negative for visual disturbance.  Respiratory: Negative for shortness of breath and wheezing.   Cardiovascular: Negative for chest pain and leg swelling.  Musculoskeletal: Negative for back pain and  gait problem.  Skin: Negative for rash.  Neurological: Positive for numbness and headaches. Negative for dizziness and light-headedness.  All other systems reviewed and are negative.   Per HPI unless specifically indicated above   Allergies as of 09/29/2020      Reactions   Penicillins       Medication List       Accurate as of September 29, 2020  1:53 PM. If you have any questions, ask your nurse or doctor.        STOP taking these medications   fluticasone 50 MCG/ACT nasal spray Commonly known as: FLONASE Stopped by: Fransisca Kaufmann Breanah Faddis, MD     TAKE these medications   allopurinol 300 MG tablet Commonly known as: ZYLOPRIM TAKE 1 TABLET BY MOUTH  DAILY   atorvastatin 40 MG tablet Commonly known as: LIPITOR TAKE 1 TABLET BY MOUTH  DAILY   benazepril 20 MG tablet Commonly known as: LOTENSIN TAKE 1 TABLET BY MOUTH  DAILY   clonazePAM 1 MG tablet Commonly known as: KLONOPIN TAKE ONE TABLET BY MOUTH TWICE DAILY AS NEEDED   metoprolol succinate 50 MG 24 hr tablet Commonly known as: TOPROL-XL Take 1 tablet (50 mg total) by mouth daily. Take with or immediately following a meal.   nabumetone 500 MG tablet Commonly known as: RELAFEN TAKE 1 TABLET BY MOUTH  DAILY   omeprazole 20 MG capsule Commonly known as: PRILOSEC TAKE 1 CAPSULE BY MOUTH  DAILY  PARoxetine 20 MG tablet Commonly known as: PAXIL Take 2 tablets (40 mg total) by mouth daily.   tamsulosin 0.4 MG Caps capsule Commonly known as: FLOMAX TAKE 1 CAPSULE BY MOUTH  DAILY        Objective:   BP 140/70   Pulse 66   Temp 98 F (36.7 C)   Ht 5\' 6"  (1.676 m)   Wt 192 lb (87.1 kg)   SpO2 98%   BMI 30.99 kg/m   Wt Readings from Last 3 Encounters:  09/29/20 192 lb (87.1 kg)  09/01/20 188 lb (85.3 kg)  07/09/20 180 lb (81.6 kg)    Physical Exam Vitals and nursing note reviewed.  Constitutional:      General: He is not in acute distress.    Appearance: He is well-developed. He is not  diaphoretic.  Eyes:     General: No scleral icterus.    Extraocular Movements: Extraocular movements intact.     Conjunctiva/sclera: Conjunctivae normal.     Pupils: Pupils are equal, round, and reactive to light.  Neck:     Thyroid: No thyromegaly.  Cardiovascular:     Rate and Rhythm: Normal rate and regular rhythm.     Heart sounds: Normal heart sounds. No murmur heard.   Pulmonary:     Effort: Pulmonary effort is normal. No respiratory distress.     Breath sounds: Normal breath sounds. No wheezing.  Musculoskeletal:        General: Normal range of motion.     Cervical back: Neck supple.  Lymphadenopathy:     Cervical: No cervical adenopathy.  Skin:    General: Skin is warm and dry.     Findings: No rash.  Neurological:     Mental Status: He is alert and oriented to person, place, and time.     Cranial Nerves: No cranial nerve deficit.     Motor: No weakness.     Coordination: Coordination normal.     Gait: Gait normal.  Psychiatric:        Behavior: Behavior normal.       Assessment & Plan:   Problem List Items Addressed This Visit      Cardiovascular and Mediastinum   HTN (hypertension) - Primary     Other   Anxiety    Other Visit Diagnoses    Headache syndrome       Relevant Orders   Ambulatory referral to Neurology   Neuropathy       Relevant Orders   Ambulatory referral to Neurology      Will do referral to neurology because of numbness and headaches and worsening and happening more frequently, they can evaluate.  Blood pressure looks good today in the office despite him saying he goes up and down.  He has a lot of anxiety related to that so I think it is more the anxiety that he picks it up and down.  Continue Paxil Follow up plan: Return in about 3 months (around 12/28/2020), or if symptoms worsen or fail to improve, for Hypertension follow-up.  Counseling provided for all of the vaccine components No orders of the defined types were placed in this  encounter.   Caryl Pina, MD [facility] 09/29/2020, 1:53 PM

## 2020-10-22 DIAGNOSIS — H04123 Dry eye syndrome of bilateral lacrimal glands: Secondary | ICD-10-CM | POA: Diagnosis not present

## 2020-10-22 DIAGNOSIS — H40033 Anatomical narrow angle, bilateral: Secondary | ICD-10-CM | POA: Diagnosis not present

## 2020-10-27 ENCOUNTER — Other Ambulatory Visit: Payer: Self-pay | Admitting: Family Medicine

## 2020-10-27 DIAGNOSIS — E7800 Pure hypercholesterolemia, unspecified: Secondary | ICD-10-CM

## 2020-11-10 ENCOUNTER — Other Ambulatory Visit: Payer: Self-pay | Admitting: Family Medicine

## 2020-11-10 DIAGNOSIS — I10 Essential (primary) hypertension: Secondary | ICD-10-CM

## 2020-12-02 DIAGNOSIS — M4606 Spinal enthesopathy, lumbar region: Secondary | ICD-10-CM | POA: Diagnosis not present

## 2020-12-02 DIAGNOSIS — M4727 Other spondylosis with radiculopathy, lumbosacral region: Secondary | ICD-10-CM | POA: Diagnosis not present

## 2020-12-02 DIAGNOSIS — R29898 Other symptoms and signs involving the musculoskeletal system: Secondary | ICD-10-CM | POA: Diagnosis not present

## 2020-12-02 DIAGNOSIS — M4316 Spondylolisthesis, lumbar region: Secondary | ICD-10-CM | POA: Diagnosis not present

## 2020-12-02 DIAGNOSIS — M4726 Other spondylosis with radiculopathy, lumbar region: Secondary | ICD-10-CM | POA: Diagnosis not present

## 2020-12-02 DIAGNOSIS — M5441 Lumbago with sciatica, right side: Secondary | ICD-10-CM | POA: Diagnosis not present

## 2020-12-02 DIAGNOSIS — M5442 Lumbago with sciatica, left side: Secondary | ICD-10-CM | POA: Diagnosis not present

## 2020-12-07 ENCOUNTER — Other Ambulatory Visit: Payer: Self-pay | Admitting: Family Medicine

## 2020-12-29 ENCOUNTER — Ambulatory Visit: Payer: Medicare Other | Admitting: Family Medicine

## 2021-01-05 DIAGNOSIS — M5442 Lumbago with sciatica, left side: Secondary | ICD-10-CM | POA: Diagnosis not present

## 2021-01-05 DIAGNOSIS — M5441 Lumbago with sciatica, right side: Secondary | ICD-10-CM | POA: Diagnosis not present

## 2021-01-20 ENCOUNTER — Other Ambulatory Visit: Payer: Self-pay | Admitting: Family Medicine

## 2021-01-20 DIAGNOSIS — E7800 Pure hypercholesterolemia, unspecified: Secondary | ICD-10-CM

## 2021-01-21 ENCOUNTER — Encounter: Payer: Self-pay | Admitting: Family Medicine

## 2021-01-21 ENCOUNTER — Other Ambulatory Visit: Payer: Self-pay

## 2021-01-21 ENCOUNTER — Ambulatory Visit (INDEPENDENT_AMBULATORY_CARE_PROVIDER_SITE_OTHER): Payer: Medicare Other | Admitting: Family Medicine

## 2021-01-21 VITALS — BP 133/71 | HR 60 | Ht 66.0 in | Wt 186.0 lb

## 2021-01-21 DIAGNOSIS — R7303 Prediabetes: Secondary | ICD-10-CM

## 2021-01-21 DIAGNOSIS — I10 Essential (primary) hypertension: Secondary | ICD-10-CM | POA: Diagnosis not present

## 2021-01-21 DIAGNOSIS — F419 Anxiety disorder, unspecified: Secondary | ICD-10-CM | POA: Diagnosis not present

## 2021-01-21 DIAGNOSIS — K219 Gastro-esophageal reflux disease without esophagitis: Secondary | ICD-10-CM

## 2021-01-21 DIAGNOSIS — E7800 Pure hypercholesterolemia, unspecified: Secondary | ICD-10-CM | POA: Diagnosis not present

## 2021-01-21 MED ORDER — TAMSULOSIN HCL 0.4 MG PO CAPS: 0.4000 mg | ORAL_CAPSULE | Freq: Every day | ORAL | 3 refills | Status: DC

## 2021-01-21 MED ORDER — PAROXETINE HCL 20 MG PO TABS: 40.0000 mg | ORAL_TABLET | Freq: Every day | ORAL | 3 refills | Status: DC

## 2021-01-21 MED ORDER — OMEPRAZOLE 20 MG PO CPDR: 20.0000 mg | DELAYED_RELEASE_CAPSULE | Freq: Every day | ORAL | 3 refills | Status: DC

## 2021-01-21 MED ORDER — ATORVASTATIN CALCIUM 40 MG PO TABS: 40.0000 mg | ORAL_TABLET | Freq: Every day | ORAL | 3 refills | Status: DC

## 2021-01-21 MED ORDER — NABUMETONE 500 MG PO TABS: 500.0000 mg | ORAL_TABLET | Freq: Every day | ORAL | 3 refills | Status: DC

## 2021-01-21 MED ORDER — METOPROLOL SUCCINATE ER 50 MG PO TB24: ORAL_TABLET | ORAL | 3 refills | Status: DC

## 2021-01-21 MED ORDER — CLONAZEPAM 1 MG PO TABS: 1.0000 mg | ORAL_TABLET | Freq: Two times a day (BID) | ORAL | 3 refills | Status: DC | PRN

## 2021-01-21 MED ORDER — ALLOPURINOL 300 MG PO TABS: 300.0000 mg | ORAL_TABLET | Freq: Every day | ORAL | 3 refills | Status: DC

## 2021-01-21 MED ORDER — BENAZEPRIL HCL 20 MG PO TABS: 20.0000 mg | ORAL_TABLET | Freq: Every day | ORAL | 3 refills | Status: DC

## 2021-01-21 NOTE — Progress Notes (Signed)
BP 133/71   Pulse 60   Ht '5\' 6"'  (1.676 m)   Wt 186 lb (84.4 kg)   SpO2 97%   BMI 30.02 kg/m    Subjective:   Patient ID: Jonathan Love, male    DOB: 1946/03/28, 75 y.o.   MRN: [id number]  HPI: Jonathan Love is a 75 y.o. male presenting on 01/21/2021 for Medical Management of Chronic Issues, Hypertension, Anxiety, and Prediabetes   HPI Prediabetes Patient comes in today for recheck of his diabetes. Patient has been currently taking no medication currently. Patient is currently on an ACE inhibitor/ARB. Patient has not seen an ophthalmologist this year. Patient denies any issues with their feet. The symptom started onset as an adult hypertension and hyperlipidemia ARE RELATED TO DM   Hypertension Patient is currently on benazepril and metoprolol, and their blood pressure today is 133/71. Patient denies any lightheadedness or dizziness. Patient denies headaches, blurred vision, chest pains, shortness of breath, or weakness. Denies any side effects from medication and is content with current medication.   Hyperlipidemia Patient is coming in for recheck of his hyperlipidemia. The patient is currently taking atorvastatin. They deny any issues with myalgias or history of liver damage from it. They deny any focal numbness or weakness or chest pain.   Anxiety recheck Patient is coming in today for anxiety recheck.  He is currently taking clonazepam and Paxil.  His anxiety is driven of his blood pressure in the past but it looks good today. Current rx-Klonopin 1 mg twice daily as needed # meds rx-60 for 3 months. Effectiveness of current meds-works well Adverse reactions form meds-none  Pill count performed-No Last drug screen -07/19/2020 ( high risk q69m moderate risk q654mlow risk yearly ) Urine drug screen today- No [place]  If yes were their any concerning findings? -None  No flowsheet data found.   Controlled substance contract signed on:  07/19/2020  Relevant past medical, surgical, family and social history reviewed and updated as indicated. Interim medical history since our last visit reviewed. Allergies and medications reviewed and updated.  Review of Systems  Constitutional: Negative for chills and fever.  Eyes: Negative for discharge.  Respiratory: Negative for shortness of breath and wheezing.   Cardiovascular: Negative for chest pain and leg swelling.  Musculoskeletal: Positive for neck pain (Arthritis in neck). Negative for back pain and gait problem.  Skin: Negative for rash.  All other systems reviewed and are negative.   Per HPI unless specifically indicated above   Allergies as of 01/21/2021      Reactions   Penicillins       Medication List       Accurate as of January 21, 2021  2:12 PM. If you have any questions, ask your nurse or doctor.        allopurinol 300 MG tablet Commonly known as: ZYLOPRIM TAKE 1 TABLET BY MOUTH  DAILY   atorvastatin 40 MG tablet Commonly known as: LIPITOR TAKE 1 TABLET BY MOUTH  DAILY   benazepril 20 MG tablet Commonly known as: LOTENSIN TAKE 1 TABLET BY MOUTH  DAILY   clonazePAM 1 MG tablet Commonly known as: KLONOPIN TAKE ONE TABLET BY MOUTH TWICE DAILY AS NEEDED   metoprolol succinate 50 MG 24 hr tablet Commonly known as: TOPROL-XL TAKE 1 TABLET BY MOUTH  DAILY WITH OR IMMEDIATELY  FOLLOWING A MEAL   nabumetone 500 MG tablet Commonly known as: RELAFEN TAKE 1 TABLET BY MOUTH  DAILY  omeprazole 20 MG capsule Commonly known as: PRILOSEC TAKE 1 CAPSULE BY MOUTH  DAILY   PARoxetine 20 MG tablet Commonly known as: PAXIL TAKE 2 TABLETS BY MOUTH  DAILY   tamsulosin 0.4 MG Caps capsule Commonly known as: FLOMAX TAKE 1 CAPSULE BY MOUTH  DAILY        Objective:   BP 133/71   Pulse 60   Ht '5\' 6"'  (1.676 m)   Wt 186 lb (84.4 kg)   SpO2 97%   BMI 30.02 kg/m   Wt Readings from Last 3 Encounters:  01/21/21 186 lb (84.4 kg)  09/29/20 192 lb (87.1  kg)  09/01/20 188 lb (85.3 kg)    Physical Exam Vitals and nursing note reviewed.  Constitutional:      General: He is not in acute distress.    Appearance: He is well-developed. He is not diaphoretic.  Eyes:     General: No scleral icterus.       Right eye: No discharge.     Conjunctiva/sclera: Conjunctivae normal.     Pupils: Pupils are equal, round, and reactive to light.  Neck:     Thyroid: No thyromegaly.  Cardiovascular:     Rate and Rhythm: Normal rate and regular rhythm.     Heart sounds: Normal heart sounds. No murmur heard.   Pulmonary:     Effort: Pulmonary effort is normal. No respiratory distress.     Breath sounds: Normal breath sounds. No wheezing.  Musculoskeletal:     Cervical back: Neck supple.  Lymphadenopathy:     Cervical: No cervical adenopathy.  Skin:    General: Skin is warm and dry.     Findings: No rash.  Neurological:     Mental Status: He is alert and oriented to person, place, and time.     Coordination: Coordination normal.  Psychiatric:        Behavior: Behavior normal.       Assessment & Plan:   Problem List Items Addressed This Visit      Cardiovascular and Mediastinum   HTN (hypertension) - Primary   Relevant Medications   atorvastatin (LIPITOR) 40 MG tablet   benazepril (LOTENSIN) 20 MG tablet   metoprolol succinate (TOPROL-XL) 50 MG 24 hr tablet     Other   HLD (hyperlipidemia)   Relevant Medications   atorvastatin (LIPITOR) 40 MG tablet   benazepril (LOTENSIN) 20 MG tablet   metoprolol succinate (TOPROL-XL) 50 MG 24 hr tablet   Other Relevant Orders   Lipid panel   Prediabetes   Anxiety   Relevant Medications   PARoxetine (PAXIL) 20 MG tablet   clonazePAM (KLONOPIN) 1 MG tablet    Other Visit Diagnoses    Essential hypertension       Relevant Medications   atorvastatin (LIPITOR) 40 MG tablet   benazepril (LOTENSIN) 20 MG tablet   metoprolol succinate (TOPROL-XL) 50 MG 24 hr tablet   Other Relevant Orders    CMP14+EGFR   Gastroesophageal reflux disease       Relevant Medications   omeprazole (PRILOSEC) 20 MG capsule   Other Relevant Orders   CBC with Differential/Platelet      Continue current medication, continue to encourage using the benzodiazepine as little as possible Follow up plan: Return in about 6 months (around 07/24/2021), or if symptoms worsen or fail to improve, for Anxiety and hypertension and prediabetes recheck.  Counseling provided for all of the vaccine components No orders of the defined types were placed in this  encounter.   Caryl Pina, MD [facility] 01/21/2021, 2:12 PM

## 2021-01-22 LAB — CBC WITH DIFFERENTIAL/PLATELET
Basophils Absolute: 0.1 10*3/uL (ref 0.0–0.2)
Basos: 1 %
EOS (ABSOLUTE): 0.5 10*3/uL — ABNORMAL HIGH (ref 0.0–0.4)
Eos: 6 %
Hematocrit: 45.1 % (ref 37.5–51.0)
Hemoglobin: 15.1 g/dL (ref 13.0–17.7)
Immature Grans (Abs): 0 10*3/uL (ref 0.0–0.1)
Immature Granulocytes: 0 %
Lymphocytes Absolute: 1.6 10*3/uL (ref 0.7–3.1)
Lymphs: 19 %
MCH: 29.4 pg (ref 26.6–33.0)
MCHC: 33.5 g/dL (ref 31.5–35.7)
MCV: 88 fL (ref 79–97)
Monocytes Absolute: 0.7 10*3/uL (ref 0.1–0.9)
Monocytes: 8 %
Neutrophils Absolute: 5.6 10*3/uL (ref 1.4–7.0)
Neutrophils: 66 %
Platelets: 181 10*3/uL (ref 150–450)
RBC: 5.14 x10E6/uL (ref 4.14–5.80)
RDW: 13.7 % (ref 11.6–15.4)
WBC: 8.5 10*3/uL (ref 3.4–10.8)

## 2021-01-22 LAB — CMP14+EGFR
ALT: 14 IU/L (ref 0–44)
AST: 15 IU/L (ref 0–40)
Albumin/Globulin Ratio: 2 (ref 1.2–2.2)
Albumin: 4.7 g/dL (ref 3.7–4.7)
Alkaline Phosphatase: 142 IU/L — ABNORMAL HIGH (ref 44–121)
BUN/Creatinine Ratio: 16 (ref 10–24)
BUN: 18 mg/dL (ref 8–27)
Bilirubin Total: 0.6 mg/dL (ref 0.0–1.2)
CO2: 26 mmol/L (ref 20–29)
Calcium: 9.5 mg/dL (ref 8.6–10.2)
Chloride: 100 mmol/L (ref 96–106)
Creatinine, Ser: 1.12 mg/dL (ref 0.76–1.27)
Globulin, Total: 2.4 g/dL (ref 1.5–4.5)
Glucose: 98 mg/dL (ref 65–99)
Potassium: 5.4 mmol/L — ABNORMAL HIGH (ref 3.5–5.2)
Sodium: 142 mmol/L (ref 134–144)
Total Protein: 7.1 g/dL (ref 6.0–8.5)
eGFR: 69 mL/min/{1.73_m2} (ref >59)

## 2021-01-22 LAB — LIPID PANEL
Chol/HDL Ratio: 3.6 ratio (ref 0.0–5.0)
Cholesterol, Total: 161 mg/dL (ref 100–199)
HDL: 45 mg/dL (ref >39)
LDL Chol Calc (NIH): 82 mg/dL (ref 0–99)
Triglycerides: 203 mg/dL — ABNORMAL HIGH (ref 0–149)
VLDL Cholesterol Cal: 34 mg/dL (ref 5–40)

## 2021-02-20 DIAGNOSIS — H40033 Anatomical narrow angle, bilateral: Secondary | ICD-10-CM | POA: Diagnosis not present

## 2021-02-20 DIAGNOSIS — H04123 Dry eye syndrome of bilateral lacrimal glands: Secondary | ICD-10-CM | POA: Diagnosis not present

## 2021-03-02 DIAGNOSIS — M542 Cervicalgia: Secondary | ICD-10-CM | POA: Diagnosis not present

## 2021-03-02 DIAGNOSIS — R202 Paresthesia of skin: Secondary | ICD-10-CM | POA: Diagnosis not present

## 2021-03-02 DIAGNOSIS — Z77098 Contact with and (suspected) exposure to other hazardous, chiefly nonmedicinal, chemicals: Secondary | ICD-10-CM | POA: Diagnosis not present

## 2021-03-02 DIAGNOSIS — G629 Polyneuropathy, unspecified: Secondary | ICD-10-CM | POA: Diagnosis not present

## 2021-03-02 DIAGNOSIS — R29898 Other symptoms and signs involving the musculoskeletal system: Secondary | ICD-10-CM | POA: Diagnosis not present

## 2021-03-07 DIAGNOSIS — R29898 Other symptoms and signs involving the musculoskeletal system: Secondary | ICD-10-CM | POA: Diagnosis not present

## 2021-03-07 DIAGNOSIS — M542 Cervicalgia: Secondary | ICD-10-CM | POA: Diagnosis not present

## 2021-03-07 DIAGNOSIS — G629 Polyneuropathy, unspecified: Secondary | ICD-10-CM | POA: Diagnosis not present

## 2021-03-07 DIAGNOSIS — M4802 Spinal stenosis, cervical region: Secondary | ICD-10-CM | POA: Diagnosis not present

## 2021-03-07 DIAGNOSIS — M50221 Other cervical disc displacement at C4-C5 level: Secondary | ICD-10-CM | POA: Diagnosis not present

## 2021-03-07 DIAGNOSIS — R202 Paresthesia of skin: Secondary | ICD-10-CM | POA: Diagnosis not present

## 2021-03-07 DIAGNOSIS — M5031 Other cervical disc degeneration,  high cervical region: Secondary | ICD-10-CM | POA: Diagnosis not present

## 2021-03-07 DIAGNOSIS — M47812 Spondylosis without myelopathy or radiculopathy, cervical region: Secondary | ICD-10-CM | POA: Diagnosis not present

## 2021-05-12 DIAGNOSIS — G629 Polyneuropathy, unspecified: Secondary | ICD-10-CM | POA: Insufficient documentation

## 2021-06-29 DIAGNOSIS — G629 Polyneuropathy, unspecified: Secondary | ICD-10-CM | POA: Diagnosis not present

## 2021-07-13 ENCOUNTER — Telehealth: Payer: Self-pay | Admitting: Family Medicine

## 2021-07-13 NOTE — Telephone Encounter (Signed)
LVM to schedule awv with nha. Please schedule this appt if pt calls the office.

## 2021-07-25 ENCOUNTER — Ambulatory Visit: Payer: Medicare Other | Admitting: Family Medicine

## 2021-08-01 ENCOUNTER — Encounter: Payer: Self-pay | Admitting: Family Medicine

## 2021-08-18 ENCOUNTER — Ambulatory Visit (INDEPENDENT_AMBULATORY_CARE_PROVIDER_SITE_OTHER): Payer: Medicare Other | Admitting: Family Medicine

## 2021-08-18 ENCOUNTER — Encounter: Payer: Self-pay | Admitting: Family Medicine

## 2021-08-18 VITALS — BP 153/81 | HR 55 | Ht 66.0 in | Wt 193.0 lb

## 2021-08-18 DIAGNOSIS — I10 Essential (primary) hypertension: Secondary | ICD-10-CM | POA: Diagnosis not present

## 2021-08-18 DIAGNOSIS — Z79891 Long term (current) use of opiate analgesic: Secondary | ICD-10-CM | POA: Diagnosis not present

## 2021-08-18 DIAGNOSIS — Z23 Encounter for immunization: Secondary | ICD-10-CM | POA: Diagnosis not present

## 2021-08-18 DIAGNOSIS — R7303 Prediabetes: Secondary | ICD-10-CM

## 2021-08-18 DIAGNOSIS — Z79899 Other long term (current) drug therapy: Secondary | ICD-10-CM | POA: Diagnosis not present

## 2021-08-18 DIAGNOSIS — E7800 Pure hypercholesterolemia, unspecified: Secondary | ICD-10-CM | POA: Diagnosis not present

## 2021-08-18 DIAGNOSIS — F419 Anxiety disorder, unspecified: Secondary | ICD-10-CM

## 2021-08-18 MED ORDER — BENAZEPRIL HCL 40 MG PO TABS: 40.0000 mg | ORAL_TABLET | Freq: Every day | ORAL | 3 refills | Status: DC

## 2021-08-18 NOTE — Addendum Note (Signed)
Addended by: Alphonzo Dublin on: 08/18/2021 03:07 PM   Modules accepted: Orders

## 2021-08-18 NOTE — Progress Notes (Signed)
BP (!) 181/80   Pulse (!) 55   Ht 5\' 6"  (1.676 m)   Wt 193 lb (87.5 kg)   SpO2 97%   BMI 31.15 kg/m    Subjective:   Patient ID: Jonathan Love, male    DOB: 12-09-1945, 76 y.o.   MRN: [id number]  HPI: Jonathan Love is a 75 y.o. male presenting on 08/18/2021 for Medical Management of Chronic Issues and Hypertension   HPI Hypertension Patient is currently on benazepril and metoprolol, and their blood pressure today is 181/80. Patient denies any lightheadedness or dizziness. Patient denies headaches, blurred vision, chest pains, shortness of breath, or weakness. Denies any side effects from medication and is content with current medication.   Hyperlipidemia Patient is coming in for recheck of his hyperlipidemia. The patient is currently taking atorvastatin. They deny any issues with myalgias or history of liver damage from it. They deny any focal numbness or weakness or chest pain.   Anxiety Current rx-Paxil and Klonopin # meds rx- clonazepam 1mg  bid prn 180 Effectiveness of current meds-works well Adverse reactions form meds-none  Pill count performed-No Last drug screen - 07/19/20 ( high risk q63m, moderate risk q41m, low risk yearly ) Urine drug screen today- Yes Was the [place] reviewed- yes  If yes were their any concerning findings? - none  No flowsheet data found.   Controlled substance contract signed on: today   Relevant past medical, surgical, family and social history reviewed and updated as indicated. Interim medical history since our last visit reviewed. Allergies and medications reviewed and updated.  Review of Systems  Constitutional:  Negative for chills and fever.  Eyes:  Negative for visual disturbance.  Respiratory:  Negative for shortness of breath and wheezing.   Cardiovascular:  Negative for chest pain and leg swelling.  Musculoskeletal:  Negative for back pain and gait problem.  Skin:  Negative for rash.  Neurological:  Negative for dizziness,  weakness and numbness.  All other systems reviewed and are negative.  Per HPI unless specifically indicated above   Allergies as of 08/18/2021       Reactions   Penicillins         Medication List        Accurate as of August 18, 2021  2:20 PM. If you have any questions, ask your nurse or doctor.          allopurinol 300 MG tablet Commonly known as: ZYLOPRIM Take 1 tablet (300 mg total) by mouth daily.   atorvastatin 40 MG tablet Commonly known as: LIPITOR Take 1 tablet (40 mg total) by mouth daily.   benazepril 40 MG tablet Commonly known as: LOTENSIN Take 1 tablet (40 mg total) by mouth daily. What changed:  medication strength how much to take Changed by: Fransisca Kaufmann Miyuki Rzasa, MD   clonazePAM 1 MG tablet Commonly known as: KLONOPIN Take 1 tablet (1 mg total) by mouth 2 (two) times daily as needed for anxiety.   gabapentin 300 MG capsule Commonly known as: NEURONTIN Take 300 mg by mouth in the morning and at bedtime.   metoprolol succinate 50 MG 24 hr tablet Commonly known as: TOPROL-XL TAKE 1 TABLET BY MOUTH  DAILY WITH OR IMMEDIATELY  FOLLOWING A MEAL   nabumetone 500 MG tablet Commonly known as: RELAFEN Take 1 tablet (500 mg total) by mouth daily.   omeprazole 20 MG capsule Commonly known as: PRILOSEC Take 1 capsule (20 mg total) by mouth daily.   PARoxetine 20 MG  tablet Commonly known as: PAXIL Take 2 tablets (40 mg total) by mouth daily.   tamsulosin 0.4 MG Caps capsule Commonly known as: FLOMAX Take 1 capsule (0.4 mg total) by mouth daily.         Objective:   BP (!) 181/80   Pulse (!) 55   Ht 5\' 6"  (1.676 m)   Wt 193 lb (87.5 kg)   SpO2 97%   BMI 31.15 kg/m   Wt Readings from Last 3 Encounters:  08/18/21 193 lb (87.5 kg)  01/21/21 186 lb (84.4 kg)  09/29/20 192 lb (87.1 kg)    Physical Exam Vitals and nursing note reviewed.  Constitutional:      General: He is not in acute distress.    Appearance: He is well-developed.  He is not diaphoretic.  Eyes:     General: No scleral icterus.    Conjunctiva/sclera: Conjunctivae normal.  Neck:     Thyroid: No thyromegaly.  Cardiovascular:     Rate and Rhythm: Normal rate and regular rhythm.     Heart sounds: Normal heart sounds. No murmur heard. Pulmonary:     Effort: Pulmonary effort is normal. No respiratory distress.     Breath sounds: Normal breath sounds. No wheezing.  Musculoskeletal:        General: No swelling. Normal range of motion.     Cervical back: Neck supple.  Lymphadenopathy:     Cervical: No cervical adenopathy.  Skin:    General: Skin is warm and dry.     Findings: No rash.  Neurological:     Mental Status: He is alert and oriented to person, place, and time.     Coordination: Coordination normal.  Psychiatric:        Behavior: Behavior normal.    Assessment & Plan:   Problem List Items Addressed This Visit       Cardiovascular and Mediastinum   HTN (hypertension) - Primary   Relevant Medications   benazepril (LOTENSIN) 40 MG tablet     Other   HLD (hyperlipidemia)   Relevant Medications   benazepril (LOTENSIN) 40 MG tablet   Prediabetes   Anxiety    Increase patient's blood pressure medicine from benazepril 20 to benazepril 40 mg daily  Otherwise continue current medicine.  It does look like he has refills of his clonazepam still. Follow up plan: Return in about 3 months (around 11/18/2021), or if symptoms worsen or fail to improve, for Anxiety and hypertension.  Counseling provided for all of the vaccine components No orders of the defined types were placed in this encounter.   Caryl Pina, MD [facility] 08/18/2021, 2:20 PM

## 2021-08-25 LAB — TOXASSURE SELECT 13 (MW), URINE

## 2021-08-31 ENCOUNTER — Telehealth: Payer: Self-pay | Admitting: Family Medicine

## 2021-08-31 NOTE — Telephone Encounter (Signed)
Unfortunately this does avoid his contract and is very unsafe to be mixing 2 of very similar medicines.  That is why the laws are in place because this can be dangerous to do that.  We will discuss it further at his next visit.

## 2021-08-31 NOTE — Telephone Encounter (Signed)
Patient's urine drug screen came back positive for Klonopin but it also came back positive for Xanax?  I do not know if he has an explanation for that but let me know if he does

## 2021-08-31 NOTE — Telephone Encounter (Signed)
Pt informed. He also has a prescription for Paxil. States that he takes this medication about every 2 days. Informed pt that he should take the Paxil everyday, avoid Xanax and take the clonazepam as needed. Pt aware to discuss all at his next visit.

## 2021-08-31 NOTE — Telephone Encounter (Signed)
Pt does not have a prescription for Xanax. States that he does have " a couple around the house" That he only takes when going to the Doctor or when he feels nervous. States that the Klonopin nor the Xanax work for him but takes both sometimes to feel like a "normal person"  Informed that this could void his contract with Dr. Warrick Parisian for the Klonopin because we do not have the medicine listed for him nor did he make [facility] aware that he was taking.

## 2021-09-13 ENCOUNTER — Telehealth: Payer: Self-pay | Admitting: Family Medicine

## 2021-09-13 NOTE — Telephone Encounter (Signed)
Patient declined the Medicare Wellness Visit with [facility]   Stated he does not need to complete this

## 2021-10-26 ENCOUNTER — Emergency Department (HOSPITAL_COMMUNITY): Payer: Medicare Other

## 2021-10-26 ENCOUNTER — Other Ambulatory Visit: Payer: Self-pay

## 2021-10-26 ENCOUNTER — Encounter (HOSPITAL_COMMUNITY): Payer: Self-pay | Admitting: *Deleted

## 2021-10-26 ENCOUNTER — Emergency Department (HOSPITAL_COMMUNITY)
Admission: EM | Admit: 2021-10-26 | Discharge: 2021-10-26 | Disposition: A | Discharge status: Home / Self Care | Payer: Medicare Other | Attending: Emergency Medicine | Admitting: Emergency Medicine

## 2021-10-26 DIAGNOSIS — R519 Headache, unspecified: Secondary | ICD-10-CM | POA: Diagnosis present

## 2021-10-26 DIAGNOSIS — Z79899 Other long term (current) drug therapy: Secondary | ICD-10-CM | POA: Diagnosis not present

## 2021-10-26 DIAGNOSIS — R0602 Shortness of breath: Secondary | ICD-10-CM | POA: Diagnosis not present

## 2021-10-26 DIAGNOSIS — I10 Essential (primary) hypertension: Secondary | ICD-10-CM | POA: Insufficient documentation

## 2021-10-26 DIAGNOSIS — R531 Weakness: Secondary | ICD-10-CM | POA: Diagnosis not present

## 2021-10-26 DIAGNOSIS — R42 Dizziness and giddiness: Secondary | ICD-10-CM | POA: Insufficient documentation

## 2021-10-26 DIAGNOSIS — R079 Chest pain, unspecified: Secondary | ICD-10-CM | POA: Diagnosis not present

## 2021-10-26 HISTORY — DX: Polyneuropathy, unspecified: G62.9

## 2021-10-26 LAB — CBC WITH DIFFERENTIAL/PLATELET
Abs Immature Granulocytes: 0.03 10*3/uL (ref 0.00–0.07)
Basophils Absolute: 0.1 10*3/uL (ref 0.0–0.1)
Basophils Relative: 1 %
Eosinophils Absolute: 0.4 10*3/uL (ref 0.0–0.5)
Eosinophils Relative: 7 %
HCT: 46.9 % (ref 39.0–52.0)
Hemoglobin: 15.6 g/dL (ref 13.0–17.0)
Immature Granulocytes: 1 %
Lymphocytes Relative: 23 %
Lymphs Abs: 1.5 10*3/uL (ref 0.7–4.0)
MCH: 31.1 pg (ref 26.0–34.0)
MCHC: 33.3 g/dL (ref 30.0–36.0)
MCV: 93.6 fL (ref 80.0–100.0)
Monocytes Absolute: 0.5 10*3/uL (ref 0.1–1.0)
Monocytes Relative: 7 %
Neutro Abs: 4 10*3/uL (ref 1.7–7.7)
Neutrophils Relative %: 61 %
Platelets: 164 10*3/uL (ref 150–400)
RBC: 5.01 MIL/uL (ref 4.22–5.81)
RDW: 14 % (ref 11.5–15.5)
WBC: 6.4 10*3/uL (ref 4.0–10.5)
nRBC: 0 % (ref 0.0–0.2)

## 2021-10-26 LAB — BASIC METABOLIC PANEL
Anion gap: 8 (ref 5–15)
BUN: 13 mg/dL (ref 8–23)
CO2: 30 mmol/L (ref 22–32)
Calcium: 9.3 mg/dL (ref 8.9–10.3)
Chloride: 100 mmol/L (ref 98–111)
Creatinine, Ser: 0.98 mg/dL (ref 0.61–1.24)
GFR, Estimated: >60 mL/min (ref >60)
Glucose, Bld: 111 mg/dL — ABNORMAL HIGH (ref 70–99)
Potassium: 5 mmol/L (ref 3.5–5.1)
Sodium: 138 mmol/L (ref 135–145)

## 2021-10-26 LAB — TROPONIN I (HIGH SENSITIVITY): Troponin I (High Sensitivity): 5 ng/L (ref <18)

## 2021-10-26 MED ORDER — AMLODIPINE BESYLATE 10 MG PO TABS: 10.0000 mg | ORAL_TABLET | Freq: Every day | ORAL | 0 refills | Status: DC

## 2021-10-26 MED ORDER — AMLODIPINE BESYLATE 5 MG PO TABS
10.0000 mg | ORAL_TABLET | Freq: Once | ORAL | Status: AC
  Administered 2021-10-26: 22:00:00 10 mg via ORAL
  Filled 2021-10-26: qty 2

## 2021-10-26 NOTE — ED Provider Notes (Signed)
[facility] EMERGENCY DEPARTMENT Provider Note   CSN: [id number] Arrival date & time: 10/26/21  1352     History Chief Complaint  Patient presents with   Headache    Jonathan Love is a 75 y.o. male.   Headache  This patient is a 75 year old male, he has a history of anxiety but also has a history of hypertension.  He follows with his family doctor regarding his hypertension and recently had his benazepril increased from 20 mg to 40 mg/day.  He presents to the hospital today with a complaint of lightheadedness and dizziness with an associated headache which is frontal, the headache has been present for several months and is associated with blowing out snot and blood in the mornings when he wakes up.  It is not associated with changes in vision, difficulty with weakness or numbness outside of his normal peripheral neuropathy.  He does walk with a cane at baseline.  This morning he noticed that his blood pressure was more elevated, he had some significantly elevated blood pressure measurements including 195/95 earlier in the day.  On arrival his blood pressure spontaneously lowered.  He did not take any of his blood pressure medication this evening.  He usually takes it at night.  He denies chest pain coughing shortness of breath palpitations and has had no swelling of his legs.  Past Medical History:  Diagnosis Date   Anxiety    Cataract    CTS (carpal tunnel syndrome)    left   Diverticulitis    GERD (gastroesophageal reflux disease)    Gout    H/O agent Orange exposure    Hyperlipidemia    Hypertension    Melanoma ([place]) 04/2020   right cheek   Migraine    Neuropathy    OAB (overactive bladder)    Prediabetes     Patient Active Problem List   Diagnosis Date Noted   Neuropathy 05/12/2021   Erectile dysfunction 02/19/2014   OE (otitis externa) 08/12/2013   GERD (gastroesophageal reflux disease)    Migraine    CTS (carpal tunnel syndrome)    H/O agent Orange exposure     OAB (overactive bladder)    Anxiety    HTN (hypertension) 04/09/2013   HLD (hyperlipidemia) 04/09/2013   Prediabetes 04/09/2013   Gout 04/09/2013    Past Surgical History:  Procedure Laterality Date   EYE SURGERY Left 08/24/2016   retinal - dr Rodman Key = laser eye   MELANOMA EXCISION Right    cheek   right lateral epicondylitis     vocal cord nodule         Family History  Problem Relation Age of Onset   Stomach cancer Father    Lung cancer Father    Heart disease Mother     Social History   Tobacco Use   Smoking status: Never   Smokeless tobacco: Never  Vaping Use   Vaping Use: Never used  Substance Use Topics   Alcohol use: No   Drug use: No    Home Medications Prior to Admission medications   Medication Sig Start Date End Date Taking? Authorizing Provider  amLODipine (NORVASC) 10 MG tablet Take 1 tablet (10 mg total) by mouth daily. 10/26/21 11/25/21 Yes Noemi Chapel, MD  allopurinol (ZYLOPRIM) 300 MG tablet Take 1 tablet (300 mg total) by mouth daily. 01/21/21   Dettinger, Fransisca Kaufmann, MD  atorvastatin (LIPITOR) 40 MG tablet Take 1 tablet (40 mg total) by mouth daily. 01/21/21   Dettinger,  Fransisca Kaufmann, MD  benazepril (LOTENSIN) 40 MG tablet Take 1 tablet (40 mg total) by mouth daily. 08/18/21   Dettinger, Fransisca Kaufmann, MD  clonazePAM (KLONOPIN) 1 MG tablet Take 1 tablet (1 mg total) by mouth 2 (two) times daily as needed for anxiety. 01/21/21   Dettinger, Fransisca Kaufmann, MD  gabapentin (NEURONTIN) 300 MG capsule Take 300 mg by mouth in the morning and at bedtime. 07/25/21   [provider]  nabumetone (RELAFEN) 500 MG tablet Take 1 tablet (500 mg total) by mouth daily. 01/21/21   Dettinger, Fransisca Kaufmann, MD  omeprazole (PRILOSEC) 20 MG capsule Take 1 capsule (20 mg total) by mouth daily. 01/21/21   Dettinger, Fransisca Kaufmann, MD  PARoxetine (PAXIL) 20 MG tablet Take 2 tablets (40 mg total) by mouth daily. 01/21/21   Dettinger, Fransisca Kaufmann, MD  pregabalin (LYRICA) 75 MG capsule Take 75  mg by mouth 2 (two) times daily. 10/20/21   [provider]  tamsulosin (FLOMAX) 0.4 MG CAPS capsule Take 1 capsule (0.4 mg total) by mouth daily. 01/21/21   Dettinger, Fransisca Kaufmann, MD    Allergies    Penicillins  Review of Systems   Review of Systems  Neurological:  Positive for headaches.  All other systems reviewed and are negative.  Physical Exam Updated Vital Signs BP (!) 168/94    Pulse (!) 56    Temp 98 F (36.7 C) (Oral)    Resp 17    Ht 1.727 m (5\' 8" )    Wt 86.2 kg    SpO2 97%    BMI 28.89 kg/m   Physical Exam Vitals and nursing note reviewed.  Constitutional:      General: He is not in acute distress.    Appearance: He is well-developed.  HENT:     Head: Normocephalic and atraumatic.     Mouth/Throat:     Pharynx: No oropharyngeal exudate.  Eyes:     General: No scleral icterus.       Right eye: No discharge.        Left eye: No discharge.     Conjunctiva/sclera: Conjunctivae normal.     Pupils: Pupils are equal, round, and reactive to light.  Neck:     Thyroid: No thyromegaly.     Vascular: No JVD.  Cardiovascular:     Rate and Rhythm: Normal rate and regular rhythm.     Heart sounds: Normal heart sounds. No murmur heard.   No friction rub. No gallop.  Pulmonary:     Effort: Pulmonary effort is normal. No respiratory distress.     Breath sounds: Normal breath sounds. No wheezing or rales.  Abdominal:     General: Bowel sounds are normal. There is no distension.     Palpations: Abdomen is soft. There is no mass.     Tenderness: There is no abdominal tenderness.  Musculoskeletal:        General: No tenderness. Normal range of motion.     Cervical back: Normal range of motion and neck supple.  Lymphadenopathy:     Cervical: No cervical adenopathy.  Skin:    General: Skin is warm and dry.     Findings: No erythema or rash.  Neurological:     Mental Status: He is alert.     Coordination: Coordination normal.     Comments: Neurologic  exam:  Speech clear, pupils equal round reactive to light, extraocular movements intact  Normal peripheral visual fields Cranial nerves III through XII normal including no  facial droop Follows commands, moves all extremities x4, normal strength to bilateral upper and lower extremities at all major muscle groups including grip Sensation normal to light touch and pinprick Coordination intact, no limb ataxia, finger-nose-finger normal, heel shin normal bilaterally Rapid alternating movements normal No pronator drift Gait normal Can heal and toe walk without weakness.    Psychiatric:        Behavior: Behavior normal.    ED Results / Procedures / Treatments   Labs (all labs ordered are listed, but only abnormal results are displayed) Labs Reviewed  BASIC METABOLIC PANEL - Abnormal; Notable for the following components:      Result Value   Glucose, Bld 111 (*)    All other components within normal limits  CBC WITH DIFFERENTIAL/PLATELET  TROPONIN I (HIGH SENSITIVITY)    EKG EKG Interpretation  Date/Time:  Wednesday October 26 2021 20:15:45 EST Ventricular Rate:  57 PR Interval:  164 QRS Duration: 97 QT Interval:  449 QTC Calculation: 438 R Axis:   18 Text Interpretation: Sinus rhythm Abnormal R-wave progression, early transition Baseline wander in lead(s) III Confirmed by Noemi Chapel [phone]) on 10/26/2021 9:30:49 PM  Radiology DG Chest 2 View  Result Date: 10/26/2021 CLINICAL DATA:  Hypertension, weakness, and shortness of breath. Intermittent chest pain. EXAM: CHEST - 2 VIEW COMPARISON:  None. FINDINGS: Heart size and pulmonary vascularity are normal. Lungs are clear. No pleural effusions. No pneumothorax. Mediastinal contours appear intact. Calcification of the aorta. Degenerative changes in the spine. IMPRESSION: No active cardiopulmonary disease. Electronically Signed   By: Lucienne Capers M.D.   On: 10/26/2021 21:20    Procedures Procedures   Medications Ordered  in ED Medications  amLODipine (NORVASC) tablet 10 mg (has no administration in time range)    ED Course  I have reviewed the triage vital signs and the nursing notes.  Pertinent labs & imaging results that were available during my care of the patient were reviewed by me and considered in my medical decision making (see chart for details).    MDM Rules/Calculators/A&P                          Labs and EKG are unremarkable, blood pressure is spontaneously improved last measured at 168/94.  The patient has no symptoms of lightheadedness at this time and only residual mild frontal headache.  I encouraged him to use Flonase nasal spray changed his metoprolol to amlodipine in case he was having some beta-blocker side effects of lightheadedness generalized weakness and fatigue.  He is agreeable to follow-up with a family doctor and at this time has no indication for CT scan imaging of the brain, no focal neurologic deficits, stable for discharge.  He understands indications for return`     Final Clinical Impression(s) / ED Diagnoses Final diagnoses:  Primary hypertension  Light headedness    Rx / DC Orders ED Discharge Orders          Ordered    amLODipine (NORVASC) 10 MG tablet  Daily        10/26/21 2139             Noemi Chapel, MD 10/26/21 2143

## 2021-10-26 NOTE — Discharge Instructions (Signed)
Your testing today was normal, there is no signs of stroke, no signs of heart problems, your blood work was normal and your blood pressure improved spontaneously.  Many of your side effects may be coming from one of your blood pressure medications called metoprolol which can cause people to be fatigued and feeling like you have no energy.  That being said you have had some high blood pressure measurements at home that have been too elevated so I would like to change her medication to amlodipine 10 mg/day, benazepril 40 mg/day and please stop the metoprolol.  I would like for you to see your doctor within 2 weeks for recheck of your blood pressure.  Please take your blood pressure medications in the morning when you get up and measure your blood pressure 2 hours later, record this number and share with your doctor.  Thank you for letting us take care of you today!  Please obtain all of your results from medical records or have your doctors office obtain the results - share them with your doctor - you should be seen at your doctors office in the next 2 days. Call today to arrange your follow up. Take the medications as prescribed. Please review all of the medicines and only take them if you do not have an allergy to them. Please be aware that if you are taking birth control pills, taking other prescriptions, ESPECIALLY ANTIBIOTICS may make the birth control ineffective - if this is the case, either do not engage in sexual activity or use alternative methods of birth control such as condoms until you have finished the medicine and your family doctor says it is OK to restart them. If you are on a blood thinner such as COUMADIN, be aware that any other medicine that you take may cause the coumadin to either work too much, or not enough - you should have your coumadin level rechecked in next 7 days if this is the case.  ?  It is also a possibility that you have an allergic reaction to any of the medicines that you  have been prescribed - Everybody reacts differently to medications and while MOST people have no trouble with most medicines, you may have a reaction such as nausea, vomiting, rash, swelling, shortness of breath. If this is the case, please stop taking the medicine immediately and contact your physician.   If you were given a medication in the ED such as percocet, vicodin, or morphine, be aware that these medicines are sedating and may change your ability to take care of yourself adequately for several hours after being given this medicines - you should not drive or take care of small children if you were given this medicine in the Emergency Department or if you have been prescribed these types of medicines. ?   You should return to the ER IMMEDIATELY if you develop severe or worsening symptoms.

## 2021-10-26 NOTE — ED Notes (Signed)
Pt placed on cardiac monitor with BP to set cycle every 30 minutes. Continuous pulse oximeter applied. EKG completed and given to Dr. Sabra Heck.

## 2021-10-26 NOTE — ED Triage Notes (Signed)
Pt with HA since yesterday, noted to have elevated BP at home and instructed to come here if SBP got over 190.

## 2021-10-26 NOTE — ED Notes (Signed)
Pt reports elevated blood pressures at home, increased weakness, sob with exertion over the last few weeks. Pt reports intermittent chest pain but none in the last few days. Pt reports near syncopal episode this morning that really concerned him. Pt reports that he gets sob frequently that improves with rest. Pt has been taking his prescribed medications as instructed.

## 2021-11-10 ENCOUNTER — Ambulatory Visit (INDEPENDENT_AMBULATORY_CARE_PROVIDER_SITE_OTHER): Payer: Medicare Other | Admitting: Family Medicine

## 2021-11-10 ENCOUNTER — Encounter: Payer: Self-pay | Admitting: Family Medicine

## 2021-11-10 VITALS — BP 133/70 | HR 105 | Ht 68.0 in | Wt 190.0 lb

## 2021-11-10 DIAGNOSIS — K219 Gastro-esophageal reflux disease without esophagitis: Secondary | ICD-10-CM

## 2021-11-10 DIAGNOSIS — I10 Essential (primary) hypertension: Secondary | ICD-10-CM

## 2021-11-10 MED ORDER — DILTIAZEM HCL ER COATED BEADS 180 MG PO CP24: 180.0000 mg | ORAL_CAPSULE | Freq: Every day | ORAL | 1 refills | Status: DC

## 2021-11-10 MED ORDER — OMEPRAZOLE 40 MG PO CPDR: 40.0000 mg | DELAYED_RELEASE_CAPSULE | Freq: Every day | ORAL | 3 refills | Status: DC

## 2021-11-10 NOTE — Progress Notes (Signed)
Jonathan Love is a 76 y.o. male who comes in today for a ED f/u. He visited the ED due to high blood pressure and a feeling of lightheadedness and dizziness. He was switched from metoprolol to amlodipine at the ED and monitored his bp for the next 2 weeks. His bp improved greatly however he complains of acid reflux and increased heart rate as well as sob.  ROS Admits to increased heart rate, sob, acid reflux, and headache. Denies all other symptoms.  Vitals are as follows: BP - 138/80, HR - 100, O2 - 99, Ht. - 68 in, Wt. - 190 lbs.  PE Cardio - regular rate and rhythm with no murmurs present Pulm - Lungs clear to auscultation bil HEENT - No erythema present in the throat and no palpable lymphnodes  A&P Primary htn. Amlodipine was discontinued due to acid reflux and Jonathan Love was prescribed diltiazem 180 mg by mouth once per day. He was advised to f/u in 1 month.  I was personally present for all components of the history, physical exam and/or medical decision making.  I agree with the documentation performed by the PA student and agree with assessment and plan above.  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     Caryl Pina, MD [facility] 11/10/2021, 3:24 PM

## 2021-11-10 NOTE — Progress Notes (Signed)
error 

## 2021-11-10 NOTE — Progress Notes (Signed)
BP 133/70    Pulse (!) 105    Ht 5\' 8"  (1.727 m)    Wt 190 lb (86.2 kg)    SpO2 99%    BMI 28.89 kg/m    Subjective:   Patient ID: Jonathan Love, male    DOB: September 21, 1946, 76 y.o.   MRN: [id number]  HPI: Jonathan Love is a 76 y.o. male presenting on 11/10/2021 for ER follow up and Hypertension (Added Norvasc- helps BP but causes sore throat and GERD)   HPI In ED 12/28 with elevated BP and it was 195/95 and had lightheadedness and dizziness. His bp cam down in ED and they switched from metoprolol to amlodipine. BP today is 133/70 and 138/80. Patient stopped metoprolol and has exertional dyspnea with tachycardia.  Patient says that he has been feeling better but is got a lot of indigestion and upset stomach and has been taking this pill for a month and it still seems to cause him problems with his heart burn.  His blood pressure and headache have been going on for approximately 2 months.  Patient also says been get some indigestion and heartburn since he started the amlodipine and it has not gotten better over the past month since he has been taking it.  Relevant past medical, surgical, family and social history reviewed and updated as indicated. Interim medical history since our last visit reviewed. Allergies and medications reviewed and updated.  Review of Systems  Constitutional:  Negative for chills and fever.  Respiratory:  Negative for shortness of breath and wheezing.   Cardiovascular:  Negative for chest pain and leg swelling.  Musculoskeletal:  Negative for back pain and gait problem.  Skin:  Negative for rash.  Neurological:  Positive for headaches. Negative for dizziness.  All other systems reviewed and are negative.  Per HPI unless specifically indicated above   Allergies as of 11/10/2021       Reactions   Penicillins Other (See Comments)   Other reaction(s): Seizures        Medication List        Accurate as of November 10, 2021  1:52 PM. If you have any  questions, ask your nurse or doctor.          STOP taking these medications    amLODipine 10 MG tablet Commonly known as: NORVASC Stopped by: Worthy Rancher, MD   clonazePAM 1 MG tablet Commonly known as: KLONOPIN Stopped by: Fransisca Kaufmann Gracen Southwell, MD       TAKE these medications    allopurinol 300 MG tablet Commonly known as: ZYLOPRIM Take 1 tablet (300 mg total) by mouth daily.   atorvastatin 40 MG tablet Commonly known as: LIPITOR Take 1 tablet (40 mg total) by mouth daily.   benazepril 40 MG tablet Commonly known as: LOTENSIN Take 1 tablet (40 mg total) by mouth daily.   diltiazem 180 MG 24 hr capsule Commonly known as: Cardizem CD Take 1 capsule (180 mg total) by mouth daily. Started by: Fransisca Kaufmann Diany Formosa, MD   gabapentin 300 MG capsule Commonly known as: NEURONTIN Take 300 mg by mouth in the morning and at bedtime.   nabumetone 500 MG tablet Commonly known as: RELAFEN Take 1 tablet (500 mg total) by mouth daily.   omeprazole 40 MG capsule Commonly known as: PRILOSEC Take 1 capsule (40 mg total) by mouth daily. What changed:  medication strength how much to take Changed by: Worthy Rancher, MD   PARoxetine 20 MG  tablet Commonly known as: PAXIL Take 2 tablets (40 mg total) by mouth daily.   pregabalin 75 MG capsule Commonly known as: LYRICA Take 75 mg by mouth 2 (two) times daily.   tamsulosin 0.4 MG Caps capsule Commonly known as: FLOMAX Take 1 capsule (0.4 mg total) by mouth daily.         Objective:   BP 133/70    Pulse (!) 105    Ht 5\' 8"  (1.727 m)    Wt 190 lb (86.2 kg)    SpO2 99%    BMI 28.89 kg/m   Wt Readings from Last 3 Encounters:  11/10/21 190 lb (86.2 kg)  10/26/21 190 lb (86.2 kg)  08/18/21 193 lb (87.5 kg)    Physical Exam Vitals and nursing note reviewed.  Constitutional:      General: He is not in acute distress.    Appearance: He is well-developed. He is not diaphoretic.  Eyes:     General: No scleral  icterus.    Conjunctiva/sclera: Conjunctivae normal.  Neck:     Thyroid: No thyromegaly.  Cardiovascular:     Rate and Rhythm: Regular rhythm. Tachycardia present.     Heart sounds: Normal heart sounds. No murmur heard. Pulmonary:     Effort: Pulmonary effort is normal. No respiratory distress.     Breath sounds: Normal breath sounds. No wheezing.  Musculoskeletal:        General: Normal range of motion.     Cervical back: Neck supple.  Lymphadenopathy:     Cervical: No cervical adenopathy.  Skin:    General: Skin is warm and dry.     Findings: No rash.  Neurological:     Mental Status: He is alert and oriented to person, place, and time.     Coordination: Coordination normal.  Psychiatric:        Behavior: Behavior normal.      Assessment & Plan:   Problem List Items Addressed This Visit       Cardiovascular and Mediastinum   HTN (hypertension) - Primary   Relevant Medications   diltiazem (CARDIZEM CD) 180 MG 24 hr capsule     Digestive   GERD (gastroesophageal reflux disease)   Relevant Medications   omeprazole (PRILOSEC) 40 MG capsule   Other Visit Diagnoses     Gastroesophageal reflux disease       Relevant Medications   omeprazole (PRILOSEC) 40 MG capsule       Will start diltiazem 180 mg for the patient.  We will have him come off of the amlodipine and see if that does better for his reflux and heartburn and still helps with the blood pressure.  Also hoping he can help with his palpitations and tachycardia that has been having as well. Follow up plan: Return in about 4 weeks (around 12/08/2021), or if symptoms worsen or fail to improve, for Hypertension.  Counseling provided for all of the vaccine components No orders of the defined types were placed in this encounter.   Caryl Pina, MD [facility] 11/10/2021, 1:52 PM

## 2021-12-09 ENCOUNTER — Encounter: Payer: Self-pay | Admitting: Family Medicine

## 2021-12-09 ENCOUNTER — Ambulatory Visit (INDEPENDENT_AMBULATORY_CARE_PROVIDER_SITE_OTHER): Payer: Medicare Other | Admitting: Family Medicine

## 2021-12-09 VITALS — BP 153/71 | HR 75 | Ht 68.0 in | Wt 191.0 lb

## 2021-12-09 DIAGNOSIS — F419 Anxiety disorder, unspecified: Secondary | ICD-10-CM

## 2021-12-09 DIAGNOSIS — I10 Essential (primary) hypertension: Secondary | ICD-10-CM | POA: Diagnosis not present

## 2021-12-09 DIAGNOSIS — E7800 Pure hypercholesterolemia, unspecified: Secondary | ICD-10-CM | POA: Diagnosis not present

## 2021-12-09 MED ORDER — BUSPIRONE HCL 15 MG PO TABS: 15.0000 mg | ORAL_TABLET | Freq: Two times a day (BID) | ORAL | 2 refills | Status: DC

## 2021-12-09 MED ORDER — ATORVASTATIN CALCIUM 40 MG PO TABS: 40.0000 mg | ORAL_TABLET | Freq: Every day | ORAL | 3 refills | Status: DC

## 2021-12-09 MED ORDER — ALLOPURINOL 300 MG PO TABS: 300.0000 mg | ORAL_TABLET | Freq: Every day | ORAL | 3 refills | Status: DC

## 2021-12-09 MED ORDER — TAMSULOSIN HCL 0.4 MG PO CAPS: 0.4000 mg | ORAL_CAPSULE | Freq: Every day | ORAL | 3 refills | Status: DC

## 2021-12-09 MED ORDER — PAROXETINE HCL 20 MG PO TABS: 40.0000 mg | ORAL_TABLET | Freq: Every day | ORAL | 3 refills | Status: DC

## 2021-12-09 MED ORDER — BENAZEPRIL HCL 40 MG PO TABS: 40.0000 mg | ORAL_TABLET | Freq: Every day | ORAL | 3 refills | Status: DC

## 2021-12-09 MED ORDER — NABUMETONE 500 MG PO TABS: 500.0000 mg | ORAL_TABLET | Freq: Every day | ORAL | 3 refills | Status: DC

## 2021-12-09 MED ORDER — DILTIAZEM HCL ER COATED BEADS 180 MG PO CP24: 180.0000 mg | ORAL_CAPSULE | Freq: Every day | ORAL | 1 refills | Status: DC

## 2021-12-09 NOTE — Progress Notes (Signed)
BP (!) 153/71    Pulse 75    Ht 5\' 8"  (1.727 m)    Wt 191 lb (86.6 kg)    SpO2 97%    BMI 29.04 kg/m    Subjective:   Patient ID: Jonathan Love, male    DOB: 1946-05-27, 76 y.o.   MRN: [id number]  HPI: Jonathan Love is a 76 y.o. male presenting on 12/09/2021 for Medical Management of Chronic Issues and Hypertension   HPI Hypertension Patient is currently on benazepril and diltiazem, and their blood pressure today is 153/71. Patient denies any lightheadedness or dizziness. Patient denies headaches, blurred vision, chest pains, shortness of breath, or weakness. Denies any side effects from medication and is content with current medication.   He has been get a little more anxiety and wants to try something different for anxiety.  Relevant past medical, surgical, family and social history reviewed and updated as indicated. Interim medical history since our last visit reviewed. Allergies and medications reviewed and updated.  Review of Systems  Constitutional:  Negative for chills and fever.  Eyes:  Negative for visual disturbance.  Respiratory:  Negative for shortness of breath and wheezing.   Cardiovascular:  Negative for chest pain and leg swelling.  Musculoskeletal:  Negative for back pain and gait problem.  Skin:  Negative for rash.  Neurological:  Negative for dizziness, weakness and light-headedness.  Psychiatric/Behavioral:  The patient is nervous/anxious.   All other systems reviewed and are negative.  Per HPI unless specifically indicated above   Allergies as of 12/09/2021       Reactions   Penicillins Other (See Comments)   Other reaction(s): Seizures        Medication List        Accurate as of December 09, 2021  4:21 PM. If you have any questions, ask your nurse or doctor.          allopurinol 300 MG tablet Commonly known as: ZYLOPRIM Take 1 tablet (300 mg total) by mouth daily.   atorvastatin 40 MG tablet Commonly known as: LIPITOR Take 1 tablet  (40 mg total) by mouth daily.   benazepril 40 MG tablet Commonly known as: LOTENSIN Take 1 tablet (40 mg total) by mouth daily.   busPIRone 15 MG tablet Commonly known as: BUSPAR Take 1 tablet (15 mg total) by mouth 2 (two) times daily. Started by: Fransisca Kaufmann Rocklyn Mayberry, MD   diltiazem 180 MG 24 hr capsule Commonly known as: Cardizem CD Take 1 capsule (180 mg total) by mouth daily.   gabapentin 300 MG capsule Commonly known as: NEURONTIN Take 300 mg by mouth in the morning and at bedtime.   nabumetone 500 MG tablet Commonly known as: RELAFEN Take 1 tablet (500 mg total) by mouth daily.   omeprazole 40 MG capsule Commonly known as: PRILOSEC Take 1 capsule (40 mg total) by mouth daily.   PARoxetine 20 MG tablet Commonly known as: PAXIL Take 2 tablets (40 mg total) by mouth daily.   pregabalin 75 MG capsule Commonly known as: LYRICA Take 75 mg by mouth 2 (two) times daily.   tamsulosin 0.4 MG Caps capsule Commonly known as: FLOMAX Take 1 capsule (0.4 mg total) by mouth daily.         Objective:   BP (!) 153/71    Pulse 75    Ht 5\' 8"  (1.727 m)    Wt 191 lb (86.6 kg)    SpO2 97%    BMI 29.04 kg/m  Wt Readings from Last 3 Encounters:  12/09/21 191 lb (86.6 kg)  11/10/21 190 lb (86.2 kg)  10/26/21 190 lb (86.2 kg)    Physical Exam Vitals and nursing note reviewed.  Constitutional:      General: He is not in acute distress.    Appearance: He is well-developed. He is not diaphoretic.  Eyes:     General: No scleral icterus.    Conjunctiva/sclera: Conjunctivae normal.  Neck:     Thyroid: No thyromegaly.  Cardiovascular:     Rate and Rhythm: Normal rate and regular rhythm.     Heart sounds: Normal heart sounds. No murmur heard. Pulmonary:     Effort: Pulmonary effort is normal. No respiratory distress.     Breath sounds: Normal breath sounds. No wheezing.  Musculoskeletal:        General: No swelling. Normal range of motion.     Cervical back: Neck supple.   Lymphadenopathy:     Cervical: No cervical adenopathy.  Skin:    General: Skin is warm and dry.     Findings: No rash.  Neurological:     Mental Status: He is alert and oriented to person, place, and time.     Coordination: Coordination normal.  Psychiatric:        Behavior: Behavior normal.      Assessment & Plan:   Problem List Items Addressed This Visit       Cardiovascular and Mediastinum   HTN (hypertension) - Primary   Relevant Medications   atorvastatin (LIPITOR) 40 MG tablet   benazepril (LOTENSIN) 40 MG tablet   diltiazem (CARDIZEM CD) 180 MG 24 hr capsule     Other   HLD (hyperlipidemia)   Relevant Medications   atorvastatin (LIPITOR) 40 MG tablet   benazepril (LOTENSIN) 40 MG tablet   diltiazem (CARDIZEM CD) 180 MG 24 hr capsule   Anxiety   Relevant Medications   PARoxetine (PAXIL) 20 MG tablet   busPIRone (BUSPAR) 15 MG tablet    We will start buspirone for anxiety, gave refills for current medicine.  His blood pressure seems to be doing a lot better.  Home blood pressure doing a lot better and he says most of the time is in the 130s at home. Follow up plan: Return if symptoms worsen or fail to improve, for Already has appointment in April, keep.  Counseling provided for all of the vaccine components No orders of the defined types were placed in this encounter.   Caryl Pina, MD [facility] 12/09/2021, 4:21 PM

## 2021-12-14 ENCOUNTER — Other Ambulatory Visit: Payer: Self-pay | Admitting: Family Medicine

## 2021-12-14 DIAGNOSIS — E7800 Pure hypercholesterolemia, unspecified: Secondary | ICD-10-CM

## 2022-01-06 ENCOUNTER — Other Ambulatory Visit: Payer: Self-pay | Admitting: Family Medicine

## 2022-01-06 DIAGNOSIS — I10 Essential (primary) hypertension: Secondary | ICD-10-CM

## 2022-02-02 ENCOUNTER — Other Ambulatory Visit: Payer: Self-pay | Admitting: Family Medicine

## 2022-02-16 ENCOUNTER — Encounter: Payer: Self-pay | Admitting: Family Medicine

## 2022-02-16 ENCOUNTER — Ambulatory Visit (INDEPENDENT_AMBULATORY_CARE_PROVIDER_SITE_OTHER): Payer: Medicare Other | Admitting: Family Medicine

## 2022-02-16 VITALS — BP 147/92 | HR 109 | Temp 97.8°F | Ht 68.0 in | Wt 182.0 lb

## 2022-02-16 DIAGNOSIS — R7303 Prediabetes: Secondary | ICD-10-CM

## 2022-02-16 DIAGNOSIS — J301 Allergic rhinitis due to pollen: Secondary | ICD-10-CM | POA: Diagnosis not present

## 2022-02-16 DIAGNOSIS — E7800 Pure hypercholesterolemia, unspecified: Secondary | ICD-10-CM | POA: Diagnosis not present

## 2022-02-16 DIAGNOSIS — F419 Anxiety disorder, unspecified: Secondary | ICD-10-CM | POA: Diagnosis not present

## 2022-02-16 DIAGNOSIS — I10 Essential (primary) hypertension: Secondary | ICD-10-CM

## 2022-02-16 LAB — BAYER DCA HB A1C WAIVED: HB A1C (BAYER DCA - WAIVED): 5.8 % — ABNORMAL HIGH (ref 4.8–5.6)

## 2022-02-16 MED ORDER — TRAZODONE HCL 50 MG PO TABS
25.0000 mg | ORAL_TABLET | Freq: Every evening | ORAL | 3 refills | Status: AC | PRN
Start: 2022-02-16 — End: ?

## 2022-02-16 MED ORDER — FLUTICASONE PROPIONATE 50 MCG/ACT NA SUSP
2.0000 | Freq: Every evening | NASAL | 6 refills | Status: DC | PRN
Start: 2022-02-16 — End: 2023-06-29

## 2022-02-16 MED ORDER — BUSPIRONE HCL 15 MG PO TABS: 15.0000 mg | ORAL_TABLET | Freq: Two times a day (BID) | ORAL | 2 refills | Status: DC

## 2022-02-16 MED ORDER — PAROXETINE HCL 20 MG PO TABS: 40.0000 mg | ORAL_TABLET | Freq: Every day | ORAL | 3 refills | Status: DC

## 2022-02-16 NOTE — Progress Notes (Signed)
? ?BP (!) 147/92   Pulse (!) 109   Temp 97.8 ?F (36.6 ?C)   Ht _0  (1.727 m)   Wt 182 lb (82.6 kg)   SpO2 96%   BMI 27.67 kg/m?   ? ?Subjective:  ? ?Patient ID: Jonathan Love, male    DOB: 1946/08/24, 76 y.o.   MRN: [id number] ? ?HPI: ?Jonathan Love is a 76 y.o. male presenting on 02/16/2022 for Medical Management of Chronic Issues, Hypertension, Nasal Congestion (X2 weeks), and Insomnia (X2 months) ? ? ?HPI ?Anxiety and insomnia ?Patient is coming in for anxiety recheck.  He is currently taking Paxil 20.  He says he is not sleeping at night.  He says has been having more anxiety.  He admits that he is not taking Paxil on Sundays when he takes the buspirone and then some days he does not take the buspirone when he has the Paxil.  He admits he is not taking them together.  He also says he is not sleeping at night somewhat because of congestion as well and he is taking Sudafed and melatonin and Benadryl for this but he also says he was having trouble sleeping even before that. ? ?  02/16/2022  ?  1:45 PM 12/09/2021  ?  3:58 PM 11/10/2021  ? 12:56 PM 11/10/2021  ? 12:55 PM 08/18/2021  ?  1:32 PM  ?Depression screen PHQ 2/9  ?Decreased Interest 2 0  0 0  ?Down, Depressed, Hopeless 2 0  0 0  ?PHQ - 2 Score 4 0  0 0  ?Altered sleeping _1 ?Tired, decreased energy _2 ?Change in appetite 2 0 0  0  ?Feeling bad or failure about yourself  1 0 0  0  ?Trouble concentrating 1 0 1  1  ?Moving slowly or fidgety/restless 2 0 2  0  ?Suicidal thoughts 1 0 0  0  ?PHQ-9 Score _3 ?Difficult doing work/chores Very difficult      ?  ?Hypertension ?Patient is currently on benazepril and diltiazem, and their blood pressure today is 147/92, elevated because he is nervous, he says is running better at home. Patient denies any lightheadedness or dizziness. Patient denies headaches, blurred vision, chest pains, shortness of breath, or weakness. Denies any side effects from medication and is content with current  medication.  ? ?Hyperlipidemia ?Patient is coming in for recheck of his hyperlipidemia. The patient is currently taking atorvastatin. They deny any issues with myalgias or history of liver damage from it. They deny any focal numbness or weakness or chest pain.  ? ?Prediabetes  ?patient comes in today for recheck of his diabetes. Patient has been currently taking no medicine currently. Patient is currently on an ACE inhibitor/ARB. Patient has not seen an ophthalmologist this year. Patient denies any issues with their feet. The symptom started onset as an adult hypertension and hyperlipidemia ARE RELATED TO DM  ? ?Relevant past medical, surgical, family and social history reviewed and updated as indicated. Interim medical history since our last visit reviewed. ?Allergies and medications reviewed and updated. ? ?Review of Systems  ?Constitutional:  Negative for chills and fever.  ?HENT:  Positive for congestion, postnasal drip and sinus pressure. Negative for ear discharge, ear pain, rhinorrhea, sneezing, sore throat and voice change.   ?Eyes:  Negative for visual disturbance.  ?Respiratory:  Positive for cough. Negative for shortness of breath and wheezing.   ?Cardiovascular:  Negative for chest pain and leg swelling.  ?Skin:  Negative for rash.  ?Psychiatric/Behavioral:  Positive for dysphoric mood and sleep disturbance. The patient is nervous/anxious.   ?All other systems reviewed and are negative. ? ?Per HPI unless specifically indicated above ? ? ?Allergies as of 02/16/2022   ? ?   Reactions  ? Penicillins Other (See Comments)  ? Other reaction(s): Seizures  ? ?  ? ?  ?Medication List  ?  ? ?  ? Accurate as of February 16, 2022  2:14 PM. If you have any questions, ask your nurse or doctor.  ?  ?  ? ?  ? ?allopurinol 300 MG tablet ?Commonly known as: ZYLOPRIM ?Take 1 tablet (300 mg total) by mouth daily. ?  ?atorvastatin 40 MG tablet ?Commonly known as: LIPITOR ?Take 1 tablet (40 mg total) by mouth daily. ?   ?benazepril 40 MG tablet ?Commonly known as: LOTENSIN ?Take 1 tablet (40 mg total) by mouth daily. ?  ?busPIRone 15 MG tablet ?Commonly known as: BUSPAR ?Take 1 tablet (15 mg total) by mouth 2 (two) times daily. ?  ?diltiazem 180 MG 24 hr capsule ?Commonly known as: Cardizem CD ?Take 1 capsule (180 mg total) by mouth daily. ?  ?diphenhydrAMINE 25 mg capsule ?Commonly known as: BENADRYL ?Take 25 mg by mouth every 6 (six) hours as needed. ?  ?fluticasone 50 MCG/ACT nasal spray ?Commonly known as: FLONASE ?Place 2 sprays into both nostrils at bedtime as needed for allergies or rhinitis. ?Started by: Worthy Rancher, MD ?  ?gabapentin 300 MG capsule ?Commonly known as: NEURONTIN ?Take 300 mg by mouth in the morning and at bedtime. ?  ?nabumetone 500 MG tablet ?Commonly known as: RELAFEN ?TAKE 1 TABLET BY MOUTH  DAILY ?  ?omeprazole 40 MG capsule ?Commonly known as: PRILOSEC ?Take 1 capsule (40 mg total) by mouth daily. ?  ?PARoxetine 20 MG tablet ?Commonly known as: PAXIL ?Take 2 tablets (40 mg total) by mouth daily. ?  ?pregabalin 75 MG capsule ?Commonly known as: LYRICA ?Take 75 mg by mouth 2 (two) times daily. ?  ?pseudoephedrine 30 MG tablet ?Commonly known as: SUDAFED ?Take 30 mg by mouth every 4 (four) hours as needed for congestion. ?  ?QUNOL ULTRA COQ10 PO ?Take by mouth at bedtime. ?  ?tamsulosin 0.4 MG Caps capsule ?Commonly known as: FLOMAX ?Take 1 capsule (0.4 mg total) by mouth daily. ?  ?traZODone 50 MG tablet ?Commonly known as: DESYREL ?Take 0.5-1 tablets (25-50 mg total) by mouth at bedtime as needed for sleep. ?Started by: Worthy Rancher, MD ?  ? ?  ? ? ? ?Objective:  ? ?BP (!) 147/92   Pulse (!) 109   Temp 97.8 ?F (36.6 ?C)   Ht _0  (1.727 m)   Wt 182 lb (82.6 kg)   SpO2 96%   BMI 27.67 kg/m?   ?Wt Readings from Last 3 Encounters:  ?02/16/22 182 lb (82.6 kg)  ?12/09/21 191 lb (86.6 kg)  ?11/10/21 190 lb (86.2 kg)  ?  ?Physical Exam ?Vitals and nursing note reviewed.  ?Constitutional:    ?   General: He is not in acute distress. ?   Appearance: He is well-developed. He is not diaphoretic.  ?Eyes:  ?   General: No scleral icterus. ?   Conjunctiva/sclera: Conjunctivae normal.  ?Neck:  ?   Thyroid: No thyromegaly.  ?Cardiovascular:  ?   Rate and Rhythm: Normal rate and regular rhythm.  ?   Heart sounds: Normal heart  sounds. No murmur heard. ?Pulmonary:  ?   Effort: Pulmonary effort is normal. No respiratory distress.  ?   Breath sounds: Normal breath sounds. No wheezing.  ?Musculoskeletal:     ?   General: Normal range of motion.  ?   Cervical back: Neck supple.  ?Lymphadenopathy:  ?   Cervical: No cervical adenopathy.  ?Skin: ?   General: Skin is warm and dry.  ?   Findings: No rash.  ?Neurological:  ?   Mental Status: He is alert and oriented to person, place, and time.  ?   Coordination: Coordination normal.  ?Psychiatric:     ?   Behavior: Behavior normal.  ? ? ? ? ?Assessment & Plan:  ? ?Problem List Items Addressed This Visit   ? ?  ? Cardiovascular and Mediastinum  ? HTN (hypertension) - Primary  ? Relevant Orders  ? CBC with Differential/Platelet  ? CMP14+EGFR  ? Lipid panel  ? Bayer DCA Hb A1c Waived  ?  ? Other  ? HLD (hyperlipidemia)  ? Relevant Orders  ? CBC with Differential/Platelet  ? CMP14+EGFR  ? Lipid panel  ? Bayer DCA Hb A1c Waived  ? Prediabetes  ? Relevant Orders  ? CBC with Differential/Platelet  ? CMP14+EGFR  ? Lipid panel  ? Bayer DCA Hb A1c Waived  ? Anxiety  ? Relevant Medications  ? busPIRone (BUSPAR) 15 MG tablet  ? PARoxetine (PAXIL) 20 MG tablet  ? traZODone (DESYREL) 50 MG tablet  ? ?Other Visit Diagnoses   ? ? Seasonal allergic rhinitis due to pollen      ? Relevant Medications  ? fluticasone (FLONASE) 50 MCG/ACT nasal spray  ? ?  ?  ?Recheck hypertension and anxiety and prediabetes ?Follow up plan: ?Return in about 3 months (around 05/18/2022), or if symptoms worsen or fail to improve, for Prediabetes and hypertension. ? ?Counseling provided for all of the vaccine  components ?Orders Placed This Encounter  ?Procedures  ? CBC with Differential/Platelet  ? CMP14+EGFR  ? Lipid panel  ? Bayer DCA Hb A1c Waived  ? ? ?Caryl Pina, MD ?[place] ?02/16/2022, 2:14 PM ? ? ? ?

## 2022-02-17 LAB — CMP14+EGFR
ALT: 19 IU/L (ref 0–44)
AST: 18 IU/L (ref 0–40)
Albumin/Globulin Ratio: 2 (ref 1.2–2.2)
Albumin: 4.7 g/dL (ref 3.7–4.7)
Alkaline Phosphatase: 146 IU/L — ABNORMAL HIGH (ref 44–121)
BUN/Creatinine Ratio: 11 (ref 10–24)
BUN: 12 mg/dL (ref 8–27)
Bilirubin Total: 0.5 mg/dL (ref 0.0–1.2)
CO2: 26 mmol/L (ref 20–29)
Calcium: 9.7 mg/dL (ref 8.6–10.2)
Chloride: 101 mmol/L (ref 96–106)
Creatinine, Ser: 1.08 mg/dL (ref 0.76–1.27)
Globulin, Total: 2.3 g/dL (ref 1.5–4.5)
Glucose: 145 mg/dL — ABNORMAL HIGH (ref 70–99)
Potassium: 4.6 mmol/L (ref 3.5–5.2)
Sodium: 142 mmol/L (ref 134–144)
Total Protein: 7 g/dL (ref 6.0–8.5)
eGFR: 72 mL/min/{1.73_m2} (ref >59)

## 2022-02-17 LAB — CBC WITH DIFFERENTIAL/PLATELET
Basophils Absolute: 0.1 10*3/uL (ref 0.0–0.2)
Basos: 1 %
EOS (ABSOLUTE): 0.6 10*3/uL — ABNORMAL HIGH (ref 0.0–0.4)
Eos: 6 %
Hematocrit: 49.1 % (ref 37.5–51.0)
Hemoglobin: 16.5 g/dL (ref 13.0–17.7)
Immature Grans (Abs): 0 10*3/uL (ref 0.0–0.1)
Immature Granulocytes: 0 %
Lymphocytes Absolute: 1.5 10*3/uL (ref 0.7–3.1)
Lymphs: 17 %
MCH: 29.8 pg (ref 26.6–33.0)
MCHC: 33.6 g/dL (ref 31.5–35.7)
MCV: 89 fL (ref 79–97)
Monocytes Absolute: 0.6 10*3/uL (ref 0.1–0.9)
Monocytes: 7 %
Neutrophils Absolute: 6 10*3/uL (ref 1.4–7.0)
Neutrophils: 69 %
Platelets: 204 10*3/uL (ref 150–450)
RBC: 5.53 x10E6/uL (ref 4.14–5.80)
RDW: 13.4 % (ref 11.6–15.4)
WBC: 8.7 10*3/uL (ref 3.4–10.8)

## 2022-02-17 LAB — LIPID PANEL
Chol/HDL Ratio: 3.7 ratio (ref 0.0–5.0)
Cholesterol, Total: 186 mg/dL (ref 100–199)
HDL: 50 mg/dL (ref >39)
LDL Chol Calc (NIH): 95 mg/dL (ref 0–99)
Triglycerides: 242 mg/dL — ABNORMAL HIGH (ref 0–149)
VLDL Cholesterol Cal: 41 mg/dL — ABNORMAL HIGH (ref 5–40)

## 2022-03-08 ENCOUNTER — Other Ambulatory Visit: Payer: Self-pay | Admitting: Family Medicine

## 2022-03-08 DIAGNOSIS — I10 Essential (primary) hypertension: Secondary | ICD-10-CM

## 2022-03-21 ENCOUNTER — Encounter: Payer: Self-pay | Admitting: Nurse Practitioner

## 2022-03-21 ENCOUNTER — Ambulatory Visit (INDEPENDENT_AMBULATORY_CARE_PROVIDER_SITE_OTHER): Payer: Medicare Other | Admitting: Nurse Practitioner

## 2022-03-21 VITALS — BP 123/69 | HR 84 | Temp 98.4°F | Resp 20 | Ht 68.0 in | Wt 180.0 lb

## 2022-03-21 DIAGNOSIS — F5101 Primary insomnia: Secondary | ICD-10-CM

## 2022-03-21 DIAGNOSIS — K5904 Chronic idiopathic constipation: Secondary | ICD-10-CM | POA: Diagnosis not present

## 2022-03-21 DIAGNOSIS — R1031 Right lower quadrant pain: Secondary | ICD-10-CM

## 2022-03-21 MED ORDER — METRONIDAZOLE 500 MG PO TABS: 500.0000 mg | ORAL_TABLET | Freq: Two times a day (BID) | ORAL | 0 refills | Status: DC

## 2022-03-21 NOTE — Patient Instructions (Signed)

## 2022-03-21 NOTE — Progress Notes (Signed)
   Subjective:    Patient ID: Jonathan Love, male    DOB: [birth year], 76 y.o.   MRN: [id number]   Chief Complaint: Flank Pain (Right side. Started 1 week ago)   HPI Patient comes in  today c/o right flank pain. He has a history of diverticulitis and it feels th e same. Started about 1 week ago. It is waxing and waining. Rates 6-7/10 currently. Touching it increases his pain. He has had a lot of constipation lately. Appetite is  good.  He saw dr. Warrick Parisian 02/16/22. He was having trouble sleeping. He was started on trazadone. He helps him stay asleep but doe snot help with falling asleep.   Review of Systems  Constitutional:  Negative for chills and fever.  Gastrointestinal:  Positive for abdominal pain (mainly in rLQ) and constipation. Negative for nausea and vomiting.      Objective:   Physical Exam Vitals and nursing note reviewed.  Constitutional:      Appearance: Normal appearance. He is well-developed.  Neck:     Thyroid: No thyroid mass or thyromegaly.     Vascular: No carotid bruit or JVD.     Trachea: Phonation normal.  Cardiovascular:     Rate and Rhythm: Normal rate and regular rhythm.  Pulmonary:     Effort: Pulmonary effort is normal. No respiratory distress.     Breath sounds: Normal breath sounds.  Abdominal:     General: Bowel sounds are normal.     Palpations: Abdomen is soft.     Tenderness: There is no abdominal tenderness.  Musculoskeletal:        General: Normal range of motion.     Cervical back: Normal range of motion and neck supple.  Lymphadenopathy:     Cervical: No cervical adenopathy.  Skin:    General: Skin is warm and dry.  Neurological:     Mental Status: He is alert and oriented to person, place, and time.  Psychiatric:        Behavior: Behavior normal.        Thought Content: Thought content normal.        Judgment: Judgment normal.   BP 123/69   Pulse 84   Temp 98.4 F (36.9 C) (Temporal)   Resp 20   Ht '5\' 8"'$  (1.727 m)   Wt 180  lb (81.6 kg)   SpO2 99%   BMI 27.37 kg/m         Assessment & Plan:   Jonathan Love in today with chief complaint of Flank Pain (Right side. Started 1 week ago)   1. Right lower quadrant pain 2. Chronic idiopathic constipation No xray available today- could be diverticulosis or constipation or combination of.  Watch diet to prevent flare up Miralax in apple juice daily Increase fiber in diet RTO if not improving  3. Primary insomnia Add zquil until can see dr. Warrick Parisian to doscuss.    The above assessment and management plan was discussed with the patient. The patient verbalized understanding of and has agreed to the management plan. Patient is aware to call the clinic if symptoms persist or worsen. Patient is aware when to return to the clinic for a follow-up visit. Patient educated on when it is appropriate to go to the emergency department.   Mary-Margaret Hassell Done, FNP

## 2022-03-28 ENCOUNTER — Telehealth: Payer: Self-pay | Admitting: Family Medicine

## 2022-03-28 NOTE — Telephone Encounter (Signed)
Patient calling to see if he can get a refill on metroNIDAZOLE (FLAGYL) 500 MG tablet that he was given on 5/23 for diverticulitis. He said that he is feeling better than he was but thinks he needs a couple more days of medicine, ran out yesterday 5/29. Please call back to let him know. If something is called in please send to [facility] in [place].

## 2022-03-28 NOTE — Telephone Encounter (Signed)
He should be fine with n more meds. Meds will stay in his system for several days.

## 2022-03-28 NOTE — Telephone Encounter (Signed)
Pts wife has been informed and understood. Advised to call back if pt started to feel bad or symptoms became worse.

## 2022-04-25 ENCOUNTER — Other Ambulatory Visit: Payer: Self-pay | Admitting: Family Medicine

## 2022-05-05 ENCOUNTER — Other Ambulatory Visit: Payer: Self-pay | Admitting: Family Medicine

## 2022-05-05 DIAGNOSIS — I10 Essential (primary) hypertension: Secondary | ICD-10-CM

## 2022-05-07 ENCOUNTER — Inpatient Hospital Stay (HOSPITAL_COMMUNITY): Payer: Medicare Other

## 2022-05-07 ENCOUNTER — Encounter (HOSPITAL_COMMUNITY): Payer: Self-pay | Admitting: Emergency Medicine

## 2022-05-07 ENCOUNTER — Emergency Department (HOSPITAL_COMMUNITY): Payer: Medicare Other

## 2022-05-07 ENCOUNTER — Other Ambulatory Visit: Payer: Self-pay

## 2022-05-07 ENCOUNTER — Observation Stay (HOSPITAL_COMMUNITY)
Admission: EM | Admit: 2022-05-07 | Discharge: 2022-05-08 | Disposition: A | Discharge status: Home / Self Care | Payer: Medicare Other | Attending: Family Medicine | Admitting: Family Medicine

## 2022-05-07 DIAGNOSIS — R7303 Prediabetes: Secondary | ICD-10-CM | POA: Diagnosis present

## 2022-05-07 DIAGNOSIS — R4020 Unspecified coma: Secondary | ICD-10-CM | POA: Diagnosis not present

## 2022-05-07 DIAGNOSIS — N400 Enlarged prostate without lower urinary tract symptoms: Secondary | ICD-10-CM | POA: Diagnosis present

## 2022-05-07 DIAGNOSIS — S91209A Unspecified open wound of unspecified toe(s) with damage to nail, initial encounter: Secondary | ICD-10-CM | POA: Diagnosis present

## 2022-05-07 DIAGNOSIS — S0003XA Contusion of scalp, initial encounter: Secondary | ICD-10-CM | POA: Diagnosis not present

## 2022-05-07 DIAGNOSIS — I10 Essential (primary) hypertension: Secondary | ICD-10-CM | POA: Diagnosis not present

## 2022-05-07 DIAGNOSIS — R0689 Other abnormalities of breathing: Secondary | ICD-10-CM | POA: Diagnosis not present

## 2022-05-07 DIAGNOSIS — Z79899 Other long term (current) drug therapy: Secondary | ICD-10-CM | POA: Insufficient documentation

## 2022-05-07 DIAGNOSIS — R000 Tachycardia, unspecified: Secondary | ICD-10-CM | POA: Insufficient documentation

## 2022-05-07 DIAGNOSIS — R2689 Other abnormalities of gait and mobility: Secondary | ICD-10-CM | POA: Diagnosis not present

## 2022-05-07 DIAGNOSIS — G629 Polyneuropathy, unspecified: Secondary | ICD-10-CM

## 2022-05-07 DIAGNOSIS — R55 Syncope and collapse: Secondary | ICD-10-CM | POA: Diagnosis present

## 2022-05-07 DIAGNOSIS — I6523 Occlusion and stenosis of bilateral carotid arteries: Secondary | ICD-10-CM | POA: Diagnosis not present

## 2022-05-07 DIAGNOSIS — R404 Transient alteration of awareness: Secondary | ICD-10-CM | POA: Diagnosis not present

## 2022-05-07 DIAGNOSIS — M6281 Muscle weakness (generalized): Secondary | ICD-10-CM | POA: Insufficient documentation

## 2022-05-07 DIAGNOSIS — Z85828 Personal history of other malignant neoplasm of skin: Secondary | ICD-10-CM | POA: Diagnosis not present

## 2022-05-07 DIAGNOSIS — F05 Delirium due to known physiological condition: Secondary | ICD-10-CM | POA: Diagnosis present

## 2022-05-07 DIAGNOSIS — Y92009 Unspecified place in unspecified non-institutional (private) residence as the place of occurrence of the external cause: Secondary | ICD-10-CM

## 2022-05-07 DIAGNOSIS — S060XAA Concussion with loss of consciousness status unknown, initial encounter: Secondary | ICD-10-CM | POA: Diagnosis present

## 2022-05-07 DIAGNOSIS — K219 Gastro-esophageal reflux disease without esophagitis: Secondary | ICD-10-CM

## 2022-05-07 DIAGNOSIS — S060X0A Concussion without loss of consciousness, initial encounter: Secondary | ICD-10-CM | POA: Diagnosis not present

## 2022-05-07 DIAGNOSIS — S199XXA Unspecified injury of neck, initial encounter: Secondary | ICD-10-CM | POA: Diagnosis not present

## 2022-05-07 DIAGNOSIS — E785 Hyperlipidemia, unspecified: Secondary | ICD-10-CM | POA: Diagnosis present

## 2022-05-07 DIAGNOSIS — R079 Chest pain, unspecified: Secondary | ICD-10-CM | POA: Diagnosis not present

## 2022-05-07 DIAGNOSIS — S060X1A Concussion with loss of consciousness of 30 minutes or less, initial encounter: Secondary | ICD-10-CM

## 2022-05-07 DIAGNOSIS — M47812 Spondylosis without myelopathy or radiculopathy, cervical region: Secondary | ICD-10-CM | POA: Diagnosis not present

## 2022-05-07 DIAGNOSIS — W19XXXA Unspecified fall, initial encounter: Secondary | ICD-10-CM | POA: Diagnosis not present

## 2022-05-07 DIAGNOSIS — R569 Unspecified convulsions: Principal | ICD-10-CM

## 2022-05-07 DIAGNOSIS — F419 Anxiety disorder, unspecified: Secondary | ICD-10-CM | POA: Diagnosis present

## 2022-05-07 DIAGNOSIS — S0990XA Unspecified injury of head, initial encounter: Secondary | ICD-10-CM | POA: Diagnosis not present

## 2022-05-07 DIAGNOSIS — M109 Gout, unspecified: Secondary | ICD-10-CM | POA: Diagnosis present

## 2022-05-07 LAB — URINALYSIS, ROUTINE W REFLEX MICROSCOPIC
Bilirubin Urine: NEGATIVE
Glucose, UA: NEGATIVE mg/dL
Hgb urine dipstick: NEGATIVE
Ketones, ur: NEGATIVE mg/dL
Leukocytes,Ua: NEGATIVE
Nitrite: NEGATIVE
Protein, ur: NEGATIVE mg/dL
Specific Gravity, Urine: 1.012 (ref 1.005–1.030)
pH: 6 (ref 5.0–8.0)

## 2022-05-07 LAB — PROTIME-INR
INR: 1 (ref 0.8–1.2)
Prothrombin Time: 13.5 seconds (ref 11.4–15.2)

## 2022-05-07 LAB — CBC WITH DIFFERENTIAL/PLATELET
Abs Immature Granulocytes: 0.03 10*3/uL (ref 0.00–0.07)
Basophils Absolute: 0 10*3/uL (ref 0.0–0.1)
Basophils Relative: 0 %
Eosinophils Absolute: 0.1 10*3/uL (ref 0.0–0.5)
Eosinophils Relative: 1 %
HCT: 45.3 % (ref 39.0–52.0)
Hemoglobin: 15.2 g/dL (ref 13.0–17.0)
Immature Granulocytes: 0 %
Lymphocytes Relative: 8 %
Lymphs Abs: 0.8 10*3/uL (ref 0.7–4.0)
MCH: 30.2 pg (ref 26.0–34.0)
MCHC: 33.6 g/dL (ref 30.0–36.0)
MCV: 89.9 fL (ref 80.0–100.0)
Monocytes Absolute: 0.5 10*3/uL (ref 0.1–1.0)
Monocytes Relative: 5 %
Neutro Abs: 8.3 10*3/uL — ABNORMAL HIGH (ref 1.7–7.7)
Neutrophils Relative %: 86 %
Platelets: 166 10*3/uL (ref 150–400)
RBC: 5.04 MIL/uL (ref 4.22–5.81)
RDW: 14.2 % (ref 11.5–15.5)
WBC: 9.8 10*3/uL (ref 4.0–10.5)
nRBC: 0 % (ref 0.0–0.2)

## 2022-05-07 LAB — COMPREHENSIVE METABOLIC PANEL
ALT: 16 U/L (ref 0–44)
AST: 22 U/L (ref 15–41)
Albumin: 4.3 g/dL (ref 3.5–5.0)
Alkaline Phosphatase: 97 U/L (ref 38–126)
Anion gap: 9 (ref 5–15)
BUN: 12 mg/dL (ref 8–23)
CO2: 26 mmol/L (ref 22–32)
Calcium: 8.9 mg/dL (ref 8.9–10.3)
Chloride: 103 mmol/L (ref 98–111)
Creatinine, Ser: 0.95 mg/dL (ref 0.61–1.24)
GFR, Estimated: >60 mL/min (ref >60)
Glucose, Bld: 137 mg/dL — ABNORMAL HIGH (ref 70–99)
Potassium: 4.4 mmol/L (ref 3.5–5.1)
Sodium: 138 mmol/L (ref 135–145)
Total Bilirubin: 0.5 mg/dL (ref 0.3–1.2)
Total Protein: 7.6 g/dL (ref 6.5–8.1)

## 2022-05-07 LAB — TSH: TSH: 3.726 u[IU]/mL (ref 0.350–4.500)

## 2022-05-07 LAB — TROPONIN I (HIGH SENSITIVITY)
Troponin I (High Sensitivity): 9 ng/L (ref <18)
Troponin I (High Sensitivity): 15 ng/L (ref <18)

## 2022-05-07 LAB — MAGNESIUM: Magnesium: 2.2 mg/dL (ref 1.7–2.4)

## 2022-05-07 LAB — CBG MONITORING, ED: Glucose-Capillary: 147 mg/dL — ABNORMAL HIGH (ref 70–99)

## 2022-05-07 MED ORDER — FENTANYL CITRATE PF 50 MCG/ML IJ SOSY
50.0000 ug | PREFILLED_SYRINGE | Freq: Once | INTRAMUSCULAR | Status: AC
  Administered 2022-05-07: 50 ug via INTRAVENOUS
  Filled 2022-05-07: qty 1

## 2022-05-07 MED ORDER — PAROXETINE HCL 20 MG PO TABS
40.0000 mg | ORAL_TABLET | Freq: Every day | ORAL | Status: DC
  Administered 2022-05-07 – 2022-05-08 (×2): 40 mg via ORAL
  Filled 2022-05-07 (×2): qty 2

## 2022-05-07 MED ORDER — KETOROLAC TROMETHAMINE 15 MG/ML IJ SOLN
15.0000 mg | Freq: Once | INTRAMUSCULAR | Status: AC
  Administered 2022-05-07: 15 mg via INTRAVENOUS
  Filled 2022-05-07: qty 1

## 2022-05-07 MED ORDER — ONDANSETRON HCL 4 MG PO TABS: 4.0000 mg | ORAL_TABLET | Freq: Four times a day (QID) | ORAL | Status: DC | PRN

## 2022-05-07 MED ORDER — SODIUM CHLORIDE 0.9% FLUSH
3.0000 mL | Freq: Two times a day (BID) | INTRAVENOUS | Status: DC
Start: 2022-05-07 — End: 2022-05-09
  Administered 2022-05-07 – 2022-05-08 (×2): 3 mL via INTRAVENOUS

## 2022-05-07 MED ORDER — HYDROMORPHONE HCL 1 MG/ML IJ SOLN
0.5000 mg | INTRAMUSCULAR | Status: DC | PRN
  Administered 2022-05-07: 0.5 mg via INTRAVENOUS
  Filled 2022-05-07: qty 0.5

## 2022-05-07 MED ORDER — LORAZEPAM 2 MG/ML IJ SOLN
2.0000 mg | Freq: Once | INTRAMUSCULAR | Status: AC
  Administered 2022-05-07: 2 mg via INTRAVENOUS
  Filled 2022-05-07: qty 1

## 2022-05-07 MED ORDER — ENOXAPARIN SODIUM 40 MG/0.4ML IJ SOSY
40.0000 mg | PREFILLED_SYRINGE | INTRAMUSCULAR | Status: DC
Start: 2022-05-07 — End: 2022-05-09
  Administered 2022-05-07 – 2022-05-08 (×2): 40 mg via SUBCUTANEOUS
  Filled 2022-05-07 (×2): qty 0.4

## 2022-05-07 MED ORDER — DILTIAZEM HCL ER COATED BEADS 180 MG PO CP24
180.0000 mg | ORAL_CAPSULE | Freq: Every day | ORAL | Status: DC
  Administered 2022-05-07 – 2022-05-08 (×2): 180 mg via ORAL
  Filled 2022-05-07 (×2): qty 1

## 2022-05-07 MED ORDER — OXYCODONE HCL 5 MG PO TABS: 5.0000 mg | ORAL_TABLET | ORAL | Status: DC | PRN

## 2022-05-07 MED ORDER — BISACODYL 5 MG PO TBEC: 5.0000 mg | DELAYED_RELEASE_TABLET | Freq: Every day | ORAL | Status: DC | PRN

## 2022-05-07 MED ORDER — LORAZEPAM 2 MG/ML IJ SOLN: 1.0000 mg | INTRAMUSCULAR | Status: DC | PRN

## 2022-05-07 MED ORDER — GABAPENTIN 300 MG PO CAPS
300.0000 mg | ORAL_CAPSULE | Freq: Two times a day (BID) | ORAL | Status: DC
Start: 2022-05-07 — End: 2022-05-08
  Administered 2022-05-07 – 2022-05-08 (×2): 300 mg via ORAL
  Filled 2022-05-07 (×2): qty 1

## 2022-05-07 MED ORDER — METOPROLOL SUCCINATE ER 50 MG PO TB24
50.0000 mg | ORAL_TABLET | Freq: Every day | ORAL | Status: DC
  Administered 2022-05-07 – 2022-05-08 (×2): 50 mg via ORAL
  Filled 2022-05-07 (×2): qty 1

## 2022-05-07 MED ORDER — ACETAMINOPHEN 325 MG PO TABS: 650.0000 mg | ORAL_TABLET | Freq: Four times a day (QID) | ORAL | Status: DC | PRN

## 2022-05-07 MED ORDER — PANTOPRAZOLE SODIUM 40 MG PO TBEC
40.0000 mg | DELAYED_RELEASE_TABLET | Freq: Every day | ORAL | Status: DC
  Administered 2022-05-07 – 2022-05-08 (×2): 40 mg via ORAL
  Filled 2022-05-07 (×2): qty 1

## 2022-05-07 MED ORDER — BENAZEPRIL HCL 20 MG PO TABS
40.0000 mg | ORAL_TABLET | Freq: Every day | ORAL | Status: DC
  Administered 2022-05-07 – 2022-05-08 (×2): 40 mg via ORAL
  Filled 2022-05-07 (×2): qty 2

## 2022-05-07 MED ORDER — ATORVASTATIN CALCIUM 40 MG PO TABS
40.0000 mg | ORAL_TABLET | Freq: Every evening | ORAL | Status: DC
  Administered 2022-05-07 – 2022-05-08 (×2): 40 mg via ORAL
  Filled 2022-05-07 (×2): qty 1

## 2022-05-07 MED ORDER — ACETAMINOPHEN 650 MG RE SUPP: 650.0000 mg | Freq: Four times a day (QID) | RECTAL | Status: DC | PRN

## 2022-05-07 MED ORDER — TAMSULOSIN HCL 0.4 MG PO CAPS
0.4000 mg | ORAL_CAPSULE | Freq: Every day | ORAL | Status: DC
  Administered 2022-05-07 – 2022-05-08 (×2): 0.4 mg via ORAL
  Filled 2022-05-07 (×2): qty 1

## 2022-05-07 MED ORDER — ACETAMINOPHEN 325 MG PO TABS
650.0000 mg | ORAL_TABLET | Freq: Once | ORAL | Status: AC
  Administered 2022-05-07: 650 mg via ORAL
  Filled 2022-05-07: qty 2

## 2022-05-07 MED ORDER — ALLOPURINOL 100 MG PO TABS
300.0000 mg | ORAL_TABLET | Freq: Every day | ORAL | Status: DC
  Administered 2022-05-07 – 2022-05-08 (×2): 300 mg via ORAL
  Filled 2022-05-07: qty 1
  Filled 2022-05-07: qty 3

## 2022-05-07 MED ORDER — BUSPIRONE HCL 5 MG PO TABS
15.0000 mg | ORAL_TABLET | Freq: Two times a day (BID) | ORAL | Status: DC
  Administered 2022-05-07 – 2022-05-08 (×2): 15 mg via ORAL
  Filled 2022-05-07 (×2): qty 3

## 2022-05-07 MED ORDER — IOHEXOL 350 MG/ML SOLN
75.0000 mL | Freq: Once | INTRAVENOUS | Status: AC | PRN
  Administered 2022-05-07: 75 mL via INTRAVENOUS

## 2022-05-07 MED ORDER — LACTATED RINGERS IV BOLUS
500.0000 mL | Freq: Once | INTRAVENOUS | Status: AC
  Administered 2022-05-07: 500 mL via INTRAVENOUS

## 2022-05-07 MED ORDER — ONDANSETRON HCL 4 MG/2ML IJ SOLN: 4.0000 mg | Freq: Four times a day (QID) | INTRAMUSCULAR | Status: DC | PRN

## 2022-05-07 MED ORDER — TRAZODONE HCL 50 MG PO TABS
50.0000 mg | ORAL_TABLET | Freq: Every evening | ORAL | Status: DC | PRN
Start: 2022-05-07 — End: 2022-05-09
  Administered 2022-05-07: 50 mg via ORAL
  Filled 2022-05-07: qty 1

## 2022-05-07 MED ORDER — FLUTICASONE PROPIONATE 50 MCG/ACT NA SUSP
2.0000 | Freq: Every evening | NASAL | Status: DC | PRN
  Filled 2022-05-07: qty 16

## 2022-05-07 NOTE — ED Triage Notes (Addendum)
Patient brought in via EMS from home. Alert with some confusion. Airway patent. Patient had unwitnessed fall/syncopal episode. Patient heard fall and found patient unresponsive on floor. LOC estimated to be approx 12 minutes. Patient arousable to name upon EMS arrival but with confusion. Patient now starting to recall events and is oriented to person, situation, and place, Per paramedic patients blood sugar 137. EKG showed right bundle branch block. Denies headache, dizziness, or blurred vision. Denies taking any type of anticoagulant.

## 2022-05-07 NOTE — ED Provider Notes (Addendum)
[facility] EMERGENCY DEPARTMENT Provider Note   CSN: [id number] Arrival date & time: 05/07/22  1004     History  Chief Complaint  Patient presents with   Loss of Consciousness    Jonathan Love is a 76 y.o. male.  HPI Patient presents for suspected syncopal episode with an unwitnessed fall.  Medical history includes HTN, HLD, prediabetes, gout, GERD, headaches, anxiety, neuropathy.  He reports that he was in his normal state of health this morning.  While he was upstairs, his wife heard the sound of him falling.  When she went to check on him, he was unresponsive.  EMS was called.  EMS reports that he was still unresponsive on their arrival.  When they went to check his pupils, he appeared to be forcibly closing his eyes.  Patient subsequently became arousable and had some confusion and repetitive questioning.  This continued throughout the transit to the hospital.  Patient's confusion did seem to improve prior to arrival.  CBG was normal prior to arrival.  Estimated LOC was 12 minutes.  Patient's wife states that he was complaining of left lower back pain last night.  Patient denies any lower back pain currently.  He does endorse some pain behind his right shoulder.  He also has pain in his left great toe where it does appear that he sustained a nail avulsion injury from his fall.  Patient reports that he has had 1 syncopal episode in the past.  This occurred 20 years ago when he had a reaction to a shot of penicillin.  He denies any history of seizures.    Home Medications Prior to Admission medications   Medication Sig Start Date End Date Taking? Authorizing Provider  allopurinol (ZYLOPRIM) 300 MG tablet Take 1 tablet (300 mg total) by mouth daily. 12/09/21  Yes Dettinger, Fransisca Kaufmann, MD  atorvastatin (LIPITOR) 40 MG tablet Take 1 tablet (40 mg total) by mouth daily. 12/09/21  Yes Dettinger, Fransisca Kaufmann, MD  benazepril (LOTENSIN) 40 MG tablet TAKE 1 TABLET BY MOUTH  DAILY Patient taking  differently: Take 40 mg by mouth daily. 05/05/22  Yes Dettinger, Fransisca Kaufmann, MD  busPIRone (BUSPAR) 15 MG tablet Take 1 tablet (15 mg total) by mouth 2 (two) times daily. 02/16/22  Yes Dettinger, Fransisca Kaufmann, MD  Coenzyme Q10-Vitamin E (QUNOL ULTRA COQ10 PO) Take 1 capsule by mouth at bedtime.   Yes [provider]  diltiazem (CARDIZEM CD) 180 MG 24 hr capsule TAKE 1 CAPSULE BY MOUTH DAILY Patient taking differently: Take 180 mg by mouth daily. 03/09/22  Yes Dettinger, Fransisca Kaufmann, MD  fluticasone (FLONASE) 50 MCG/ACT nasal spray Place 2 sprays into both nostrils at bedtime as needed for allergies or rhinitis. 02/16/22  Yes Dettinger, Fransisca Kaufmann, MD  gabapentin (NEURONTIN) 300 MG capsule Take 300 mg by mouth in the morning and at bedtime. 07/25/21  Yes [provider]  metoprolol succinate (TOPROL-XL) 50 MG 24 hr tablet Take 50 mg by mouth daily. Take with or immediately following a meal.   Yes [provider]  nabumetone (RELAFEN) 500 MG tablet TAKE 1 TABLET BY MOUTH DAILY Patient taking differently: Take 500 mg by mouth daily. 04/25/22  Yes Dettinger, Fransisca Kaufmann, MD  omeprazole (PRILOSEC) 40 MG capsule Take 1 capsule (40 mg total) by mouth daily. 11/10/21  Yes Dettinger, Fransisca Kaufmann, MD  PARoxetine (PAXIL) 20 MG tablet Take 2 tablets (40 mg total) by mouth daily. 02/16/22  Yes Dettinger, Fransisca Kaufmann, MD  tamsulosin [facility])  0.4 MG CAPS capsule Take 1 capsule (0.4 mg total) by mouth daily. 12/09/21  Yes Dettinger, Fransisca Kaufmann, MD  traZODone (DESYREL) 50 MG tablet Take 0.5-1 tablets (25-50 mg total) by mouth at bedtime as needed for sleep. 02/16/22  Yes Dettinger, Fransisca Kaufmann, MD      Allergies    Penicillins    Review of Systems   Review of Systems  Musculoskeletal:  Positive for back pain.  Skin:  Positive for wound.  Neurological:  Positive for syncope.  Psychiatric/Behavioral:  Positive for confusion.   All other systems reviewed and are negative.   Physical Exam Updated Vital Signs BP  137/82   Pulse 95   Temp 99.3 F (37.4 C) (Rectal)   Resp 17   Ht '5\' 8"'$  (1.727 m)   Wt 79.4 kg   SpO2 97%   BMI 26.61 kg/m  Physical Exam Vitals and nursing note reviewed.  Constitutional:      General: He is not in acute distress.    Appearance: Normal appearance. He is well-developed. He is not ill-appearing, toxic-appearing or diaphoretic.  HENT:     Head: Normocephalic and atraumatic.     Right Ear: External ear normal.     Left Ear: External ear normal.     Nose: Nose normal.     Mouth/Throat:     Mouth: Mucous membranes are moist.     Pharynx: Oropharynx is clear.  Eyes:     Extraocular Movements: Extraocular movements intact.     Conjunctiva/sclera: Conjunctivae normal.  Cardiovascular:     Rate and Rhythm: Normal rate and regular rhythm.     Heart sounds: No murmur heard. Pulmonary:     Effort: Pulmonary effort is normal. No respiratory distress.     Breath sounds: Normal breath sounds. No wheezing or rales.  Chest:     Chest wall: No tenderness.  Abdominal:     Palpations: Abdomen is soft.     Tenderness: There is no abdominal tenderness.  Musculoskeletal:        General: No swelling. Normal range of motion.     Cervical back: Normal range of motion and neck supple. No rigidity.     Right lower leg: No edema.     Left lower leg: No edema.     Comments: Avulsed left great toenail with matrix intact.  Skin:    General: Skin is warm and dry.     Coloration: Skin is not jaundiced or pale.  Neurological:     General: No focal deficit present.     Mental Status: He is alert and oriented to person, place, and time.     Cranial Nerves: No cranial nerve deficit.     Sensory: No sensory deficit.     Motor: No weakness.     Coordination: Coordination normal.     Comments: Mildly tremulous  Psychiatric:        Mood and Affect: Mood normal.        Behavior: Behavior normal.        Thought Content: Thought content normal.        Judgment: Judgment normal.      ED Results / Procedures / Treatments   Labs (all labs ordered are listed, but only abnormal results are displayed) Labs Reviewed  CBC WITH DIFFERENTIAL/PLATELET - Abnormal; Notable for the following components:      Result Value   Neutro Abs 8.3 (*)    All other components within normal limits  COMPREHENSIVE METABOLIC PANEL -  Abnormal; Notable for the following components:   Glucose, Bld 137 (*)    All other components within normal limits  CBG MONITORING, ED - Abnormal; Notable for the following components:   Glucose-Capillary 147 (*)    All other components within normal limits  MAGNESIUM  URINALYSIS, ROUTINE W REFLEX MICROSCOPIC  PROTIME-INR  TROPONIN I (HIGH SENSITIVITY)  TROPONIN I (HIGH SENSITIVITY)    EKG EKG Interpretation  Date/Time:  Sunday May 07 2022 10:21:48 EDT Ventricular Rate:  100 PR Interval:  151 QRS Duration: 145 QT Interval:  365 QTC Calculation: 471 R Axis:   -57 Text Interpretation: Sinus tachycardia RBBB and LAFB Confirmed by Godfrey Pick ([id number]) on 05/07/2022 12:03:10 PM  Radiology CT ANGIO HEAD NECK W WO CM  Result Date: 05/07/2022 CLINICAL DATA:  Head trauma, moderate-severe EXAM: CT ANGIOGRAPHY HEAD AND NECK TECHNIQUE: Multidetector CT imaging of the head and neck was performed using the standard protocol during bolus administration of intravenous contrast. Multiplanar CT image reconstructions and MIPs were obtained to evaluate the vascular anatomy. Carotid stenosis measurements (when applicable) are obtained utilizing NASCET criteria, using the distal internal carotid diameter as the denominator. RADIATION DOSE REDUCTION: This exam was performed according to the departmental dose-optimization program which includes automated exposure control, adjustment of the mA and/or kV according to patient size and/or use of iterative reconstruction technique. CONTRAST:  2m OMNIPAQUE IOHEXOL 350 MG/ML SOLN COMPARISON:  Same day CT head. FINDINGS: CTA NECK  FINDINGS Aortic arch: Great vessel origins are patent. Right carotid system: Atherosclerosis at the carotid bifurcation with approximately 40% stenosis of the ICA origin. No evidence of dissection. Left carotid system: Atherosclerosis at the carotid bifurcation without greater than 50% stenosis. No evidence of dissection. Vertebral arteries: Right dominant. Patent bilaterally without evidence of 50% or greater stenosis. No evidence of dissection. Skeleton: Multilevel degenerative change of the cervical spine with prominent bridging anterior osteophytes. Other neck: No acute abnormality. Upper chest: Visualized lung apices are clear. Review of the MIP images confirms the above findings CTA HEAD FINDINGS Anterior circulation: Bilateral intracranial ICAs, MCAs, and ACAs are patent without proximal hemodynamically significant stenosis. Mild bilateral intracranial ICA stenosis. No aneurysm identified. Posterior circulation: Bilateral intradural vertebral arteries, basilar artery and bilateral posterior cerebral arteries are patent without proximal hemodynamically significant stenosis. Venous sinuses: As permitted by contrast timing, patent. Review of the MIP images confirms the above findings IMPRESSION: 1. No emergent large vessel occlusion or intracranial proximal hemodynamically significant stenosis. 2. No evidence of acute arterial injury. 3. 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 Electronically Signed   By: FMargaretha SheffieldM.D.   On: 05/07/2022 15:14   CT HEAD WO CONTRAST  Result Date: 05/07/2022 CLINICAL DATA:  Head trauma, minor (Age >= 65y); Neck trauma (Age >= 65y) EXAM: CT HEAD WITHOUT CONTRAST CT CERVICAL SPINE WITHOUT CONTRAST TECHNIQUE: Multidetector CT imaging of the head and cervical spine was performed following the standard protocol without intravenous contrast. Multiplanar CT image reconstructions of the cervical spine were also  generated. RADIATION DOSE REDUCTION: This exam was performed according to the departmental dose-optimization program which includes automated exposure control, adjustment of the mA and/or kV according to patient size and/or use of iterative reconstruction technique. COMPARISON:  CT cervical spine August 25, 2015. FINDINGS: CT HEAD FINDINGS Brain: No evidence of acute infarction, hemorrhage, hydrocephalus, extra-axial collection or mass lesion/mass effect. Vascular: No hyperdense vessel identified. Calcific atherosclerosis. Skull: Left-sided scalp contusion without acute calvarial fracture. Sinuses/Orbits: Clear visualized  sinuses. No acute orbital findings. Other: No mastoid effusions. CT CERVICAL SPINE FINDINGS Alignment: No substantial sagittal subluxation. Skull base and vertebrae: No evidence of acute fracture. Mild height loss of the T2 vertebral body appears similar to prior radiographs. Soft tissues and spinal canal: No prevertebral fluid or swelling. No visible canal hematoma. Disc levels: Bulky bridging anterior osteophytes throughout the cervical spine. Degenerative disc disease greatest in the lower cervical spine. Multilevel endplate spurring. Multilevel facet and uncovertebral hypertrophy with varying degrees of neural foraminal stenosis. Upper chest: Visualized lung apices are clear. IMPRESSION: CT head: 1. No evidence of acute intracranial abnormality. 2. Left-sided scalp contusion without acute calvarial fracture CT cervical spine: 1. No evidence of acute fracture or traumatic malalignment. 2. Multilevel degenerative change, detailed above. Electronically Signed   By: Margaretha Sheffield M.D.   On: 05/07/2022 11:04   CT CERVICAL SPINE WO CONTRAST  Result Date: 05/07/2022 CLINICAL DATA:  Head trauma, minor (Age >= 65y); Neck trauma (Age >= 65y) EXAM: CT HEAD WITHOUT CONTRAST CT CERVICAL SPINE WITHOUT CONTRAST TECHNIQUE: Multidetector CT imaging of the head and cervical spine was performed following  the standard protocol without intravenous contrast. Multiplanar CT image reconstructions of the cervical spine were also generated. RADIATION DOSE REDUCTION: This exam was performed according to the departmental dose-optimization program which includes automated exposure control, adjustment of the mA and/or kV according to patient size and/or use of iterative reconstruction technique. COMPARISON:  CT cervical spine August 25, 2015. FINDINGS: CT HEAD FINDINGS Brain: No evidence of acute infarction, hemorrhage, hydrocephalus, extra-axial collection or mass lesion/mass effect. Vascular: No hyperdense vessel identified. Calcific atherosclerosis. Skull: Left-sided scalp contusion without acute calvarial fracture. Sinuses/Orbits: Clear visualized sinuses. No acute orbital findings. Other: No mastoid effusions. CT CERVICAL SPINE FINDINGS Alignment: No substantial sagittal subluxation. Skull base and vertebrae: No evidence of acute fracture. Mild height loss of the T2 vertebral body appears similar to prior radiographs. Soft tissues and spinal canal: No prevertebral fluid or swelling. No visible canal hematoma. Disc levels: Bulky bridging anterior osteophytes throughout the cervical spine. Degenerative disc disease greatest in the lower cervical spine. Multilevel endplate spurring. Multilevel facet and uncovertebral hypertrophy with varying degrees of neural foraminal stenosis. Upper chest: Visualized lung apices are clear. IMPRESSION: CT head: 1. No evidence of acute intracranial abnormality. 2. Left-sided scalp contusion without acute calvarial fracture CT cervical spine: 1. No evidence of acute fracture or traumatic malalignment. 2. Multilevel degenerative change, detailed above. Electronically Signed   By: Margaretha Sheffield M.D.   On: 05/07/2022 11:04   DG Chest 1 View  Result Date: 05/07/2022 CLINICAL DATA:  Fall.  Pain between the shoulder blades. EXAM: CHEST  1 VIEW COMPARISON:  10/18/2021 FINDINGS: 1028 hours.  The lungs are clear without focal pneumonia, edema, pneumothorax or pleural effusion. The cardiopericardial silhouette is within normal limits for size. The visualized bony structures of the thorax are unremarkable. Telemetry leads overlie the chest. IMPRESSION: No active disease. Electronically Signed   By: Misty Stanley M.D.   On: 05/07/2022 11:02    Procedures Procedures    Medications Ordered in ED Medications  lactated ringers bolus 500 mL (0 mLs Intravenous Stopped 05/07/22 1238)  LORazepam (ATIVAN) injection 2 mg (2 mg Intravenous Given 05/07/22 1149)  acetaminophen (TYLENOL) tablet 650 mg (650 mg Oral Given 05/07/22 1347)  fentaNYL (SUBLIMAZE) injection 50 mcg (50 mcg Intravenous Given 05/07/22 1352)  ketorolac (TORADOL) 15 MG/ML injection 15 mg (15 mg Intravenous Given 05/07/22 1355)  iohexol (OMNIPAQUE) 350 MG/ML injection  75 mL (75 mLs Intravenous Contrast Given 05/07/22 1448)    ED Course/ Medical Decision Making/ A&P                           Medical Decision Making Amount and/or Complexity of Data Reviewed Labs: ordered. Radiology: ordered. ECG/medicine tests: ordered.  Risk OTC drugs. Prescription drug management. Decision regarding hospitalization.   This patient presents to the ED for concern of syncope and fall, this involves an extensive number of treatment options, and is a complaint that carries with it a high risk of complications and morbidity.  The differential diagnosis includes arrhythmia, seizure, ACS, PE, vasovagal episode, hypoglycemia, polypharmacy, acute injuries   Co morbidities that complicate the patient evaluation  HTN, HLD, prediabetes, gout, GERD, headaches, anxiety, neuropathy   Additional history obtained:  Additional history obtained from EMS External records from outside source obtained and reviewed including EMR   Lab Tests:  I Ordered, and personally interpreted labs.  The pertinent results include: Normal findings   Imaging Studies  ordered:  I ordered imaging studies including CT head, CTA of head and neck I independently visualized and interpreted imaging which showed no acute findings on CT head; CTA head and neck showed I agree with the radiologist interpretation   Cardiac Monitoring: / EKG:  The patient was maintained on a cardiac monitor.  I personally viewed and interpreted the cardiac monitored which showed an underlying rhythm of: Sinus rhythm   Consultations Obtained:  I requested consultation with the neurologist, Dr. Quinn Axe,  and discussed lab and imaging findings as well as pertinent plan - they recommend: CTA of head and neck here, no loading of AEDs for now.  If patient is to have any further seizures, Keppra load is advised.  Admission to [facility] is advised for MRI and EEG studies.   Problem List / ED Course / Critical interventions / Medication management  Patient is a pleasant 75 year old male who presents after a likely syncopal episode.  This was unwitnessed.  His wife did hear him fall while at home.  When she went to check on him, he was unresponsive.  He had a prolonged episode of unresponsive, estimated to be 12 minutes.  He had subsequent confusion and repetitive questioning.  On arrival in the ED, he is alert and oriented.  He has no focal neurologic deficits.  On physical exam, he does appear to have a hematoma to the left parietal scalp.  He endorses some pain behind his right shoulder and there is some tenderness present in the trap as he is musculature.  No cardiac murmurs are appreciated.  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  Patient has no areas of chest or abdominal tenderness.  He does have a newly avulsed left great toenail with matrix intact.  Toenail was repositioned to act as splint and toe was wrapped in [place].  Diagnostic work-up was initiated.  Results of initial work-up were unremarkable.  On noncontrasted CT scan of head and cervical spine, left-sided scalp hematoma was  identified but there were no other acute findings.  While in the ED, patient was speaking with family and acting appropriately.  He then had a witnessed tonic-clonic seizure.  During this time, he had snoring respirations.  SPO2 dropped.  He was placed on supplemental oxygen and jaw thrust was applied.  Seizure abated on its own.  He was given a 2 mg dose of Ativan.  He had a postictal  phase which resolved over approximately 45 minutes.  I was able to obtain further history from the patient's wife, who now accompanies him at bedside.  She confirms that he has no seizure history.  She describes the events as this morning as follows: She heard the patient cry out.  She did not hear him fall.  She went to check on him and he was on the ground unresponsive.  He did not have convulsive movements at that time.  Patient fell in the bathroom and she describes the floor is concrete.  She states that he was in his normal state of health last night.  They sleep in separate bedrooms and she did not see him this morning prior to his LOC.  Given his known head trauma this morning, seizure in the ED may be postconcussive in etiology.  Following his witnessed seizure in the ED and returned to mental baseline, patient does endorse headache pain and neck pain.  Although this does raise concern for meningitis, patient has no fever and no leukocytosis. I spoke with neurologist on-call, Dr. Quinn Axe who recommends admission to [facility].  She does not recommend loading of AEDs at this time.  If patient is to have another witnessed seizure, Keppra load is recommended.  In the meantime, will obtain CTA of head and neck to check for dissection.  CTA showed no acute findings to explain patient's recent symptoms.  Patient was admitted to hospitalist for further management. I ordered medication including Ativan for seizure; Tylenol, fentanyl, and Toradol for analgesia Reevaluation of the patient after these medicines showed that the patient  improved I have reviewed the patients home medicines and have made adjustments as needed   Social Determinants of Health:  Lives at home with wife   Test / Admission - Considered:  Considered lumbar puncture, however, patient has no fever or leukocytosis to suggest acute infection, lowering suspicion of meningitis.  CRITICAL CARE Performed by: Godfrey Pick   Total critical care time: 35 minutes  Critical care time was exclusive of separately billable procedures and treating other patients.  Critical care was necessary to treat or prevent imminent or life-threatening deterioration.  Critical care was time spent personally by me on the following activities: development of treatment plan with patient and/or surrogate as well as nursing, discussions with consultants, evaluation of patient's response to treatment, examination of patient, obtaining history from patient or surrogate, ordering and performing treatments and interventions, ordering and review of laboratory studies, ordering and review of radiographic studies, pulse oximetry and re-evaluation of patient's condition.        Final Clinical Impression(s) / ED Diagnoses Final diagnoses:  Fall, initial encounter  Seizure [facility])    Rx / DC Orders ED Discharge Orders     None         Godfrey Pick, MD 05/07/22 1617    Godfrey Pick, MD 05/07/22 [phone]

## 2022-05-07 NOTE — Assessment & Plan Note (Signed)
--   Patient had a witnessed tonic-clonic seizure while being triaged in the emergency department.  He likely had a seizure at home that prompted his syncopal episode.  He could also be having a postconcussive seizure. -- Is being admitted to [facility] per neurology recommendation for neurology consultation MRI brain, EEG and further work-up -- I am sending him for CTA of the head and neck while he is waiting for bed at [facility]. -- His metabolic work-up so far has been unrevealing. -- Continue to monitor on telemetry -- Continue neurochecks -- Plan to get MRI brain when he arrives at [facility]. -- Further recommendations to follow -- If patient develops fever ED provider planning to do lumbar puncture

## 2022-05-07 NOTE — Assessment & Plan Note (Signed)
--   pt reports pregabalin doesn't help as much as gabapentin and planning to discuss changing back to gabapentin with his primary providers

## 2022-05-07 NOTE — Assessment & Plan Note (Signed)
--   restarting home allopurinol

## 2022-05-07 NOTE — Assessment & Plan Note (Signed)
--   carb modified diet, avoid concentrated sweets

## 2022-05-07 NOTE — ED Notes (Signed)
Jonathan Love son may also be contacted if needed at [phone].

## 2022-05-07 NOTE — Assessment & Plan Note (Signed)
--   resume home meds and follow

## 2022-05-07 NOTE — Hospital Course (Addendum)
76 year old gentleman living at home with his wife experienced an unwitnessed fall and syncopal episode at home.  Wife apparently heard the patient fall with a loud thud and found the patient unresponsive on the floor in his bedroom.  He had lost consciousness for approximately 12 minutes.  EMS was called and they found him to be arousable to name but very confused.  Over the course of the next few minutes he started to awaken and recall his name and gradually return to his baseline.  His blood sugar at the time was 137.  There was a right bundle branch block on EKG.  He hit the back of his head.  He also had an avulsion to his great left toe and complained of pain.  While being worked up in the emergency department there was a loud noise heard in the exam room and family came out to get the nurse and they found the patient convulsing making gurgling sounds and jerking in the bed.  His oxygen saturation dropped into the 30s.  His lips turned blue.  A nonrebreather was placed on the patient.  Fortunately his oxygen saturation rebounded to 100% and he was postictal.  He gradually returned to baseline.  He was given 2 mg of Ativan but that was after he had started recovering from the seizure.  He did not receive antiepileptic medication.  Neurology was consulted who recommended patient be admitted to [facility] for neurology consultation recommended CTA head and neck and MRI brain and EEG.  If patient were to have further seizure would load with Keppra per neurology recommendations.  Patient is currently afebrile but if he were to develop a fever the ED physician would proceed with lumbar puncture.  At  this time he is afebrile with normal WBC.    Pt reports remote history of syncope from many years ago and a remote history of migraine headaches but reports he no longer has headaches.  He reports that he has prediabetes and he has neuropathy in the feet.

## 2022-05-07 NOTE — ED Notes (Signed)
Loud noise heard from room. Family came out into hall. Lab tech in room with patient. Per tech patient while she was getting ready to get blood work patient started to make gurgling sounds   convulse. Patient jerking in bed. O2 sat dropped into 30s. C-collar removed due to airway. Lips blue. Doctor notified immediately. Suction on. Non-rebreather placed on patient. EDP to room. Color to lips returning. O2 sat 100%.

## 2022-05-07 NOTE — ED Notes (Signed)
Pt wife stated she was cold. Got blanket for pt wife

## 2022-05-07 NOTE — H&P (Addendum)
History and Physical  [facility]  [name] OIN:[id number] DOB: October 18, 1946 DOA: 05/07/2022  PCP: Dettinger, Fransisca Kaufmann, MD  Patient coming from: Home by [facility]  Level of care: Telemetry Medical  I have personally briefly reviewed patient's old medical records in [place]  Chief Complaint: passed out   HPI: Jonathan Love is a 76 year old gentleman living at home with his wife experienced an unwitnessed fall and syncopal episode at home.  Wife apparently heard the patient fall with a loud thud and found the patient unresponsive on the floor in his bedroom.  He had lost consciousness for approximately 12 minutes.  EMS was called and they found him to be arousable to name but very confused.  Over the course of the next few minutes he started to awaken and recall his name and gradually return to his baseline.  His blood sugar at the time was 137.  There was a right bundle branch block on EKG.  He hit the back of his head.  He also had an avulsion to his great left toe and complained of pain.  While being worked up in the emergency department there was a loud noise heard in the exam room and family came out to get the nurse and they found the patient convulsing making gurgling sounds and jerking in the bed.  His oxygen saturation dropped into the 30s.  His lips turned blue.  A nonrebreather was placed on the patient.  Fortunately his oxygen saturation rebounded to 100% and he was postictal.  He gradually returned to baseline.  He was given 2 mg of Ativan but that was after he had started recovering from the seizure.  He did not receive antiepileptic medication.  Neurology was consulted who recommended patient be admitted to [facility] for neurology consultation recommended CTA head and neck and MRI brain and EEG.  If patient were to have further seizure would load with Keppra per neurology recommendations.  Patient is currently afebrile but if he were to develop a fever the ED physician  would proceed with lumbar puncture.  At  this time he is afebrile with normal WBC.    Pt reports remote history of syncope from many years ago and a remote history of migraine headaches but reports he no longer has headaches.  He reports that he has prediabetes and he has neuropathy in the feet.  Review of Systems: Review of Systems  Constitutional: Negative.   HENT: Negative.    Eyes: Negative.   Respiratory: Negative.    Cardiovascular: Negative.   Gastrointestinal: Negative.   Genitourinary: Negative.   Musculoskeletal:  Positive for falls, joint pain, myalgias and neck pain.  Skin: Negative.   Neurological:  Positive for sensory change, loss of consciousness and headaches. Negative for dizziness, speech change and focal weakness.  Endo/Heme/Allergies: Negative.   Psychiatric/Behavioral:  Negative for depression. The patient is nervous/anxious.   All other systems reviewed and are negative.    Past Medical History:  Diagnosis Date   Anxiety    Cataract    CTS (carpal tunnel syndrome)    left   Diverticulitis    GERD (gastroesophageal reflux disease)    Gout    H/O agent Orange exposure    Hyperlipidemia    Hypertension    Melanoma ([place]) 04/2020   right cheek   Migraine    Neuropathy    OAB (overactive bladder)    Prediabetes     Past Surgical History:  Procedure Laterality  Date   EYE SURGERY Left 08/24/2016   retinal - dr Rodman Key = laser eye   MELANOMA EXCISION Right    cheek   right lateral epicondylitis     vocal cord nodule       reports that he has never smoked. He has never used smokeless tobacco. He reports that he does not drink alcohol and does not use drugs.  Allergies  Allergen Reactions   Penicillins Other (See Comments)    Seizures    Family History  Problem Relation Age of Onset   Stomach cancer Father    Lung cancer Father    Heart disease Mother     Prior to Admission medications   Medication Sig Start Date End Date Taking?  Authorizing Provider  allopurinol (ZYLOPRIM) 300 MG tablet Take 1 tablet (300 mg total) by mouth daily. 12/09/21  Yes Dettinger, Fransisca Kaufmann, MD  atorvastatin (LIPITOR) 40 MG tablet Take 1 tablet (40 mg total) by mouth daily. 12/09/21  Yes Dettinger, Fransisca Kaufmann, MD  benazepril (LOTENSIN) 40 MG tablet TAKE 1 TABLET BY MOUTH  DAILY Patient taking differently: Take 40 mg by mouth daily. 05/05/22  Yes Dettinger, Fransisca Kaufmann, MD  busPIRone (BUSPAR) 15 MG tablet Take 1 tablet (15 mg total) by mouth 2 (two) times daily. 02/16/22  Yes Dettinger, Fransisca Kaufmann, MD  Coenzyme Q10-Vitamin E (QUNOL ULTRA COQ10 PO) Take 1 capsule by mouth at bedtime.   Yes [provider]  diltiazem (CARDIZEM CD) 180 MG 24 hr capsule TAKE 1 CAPSULE BY MOUTH DAILY Patient taking differently: Take 180 mg by mouth daily. 03/09/22  Yes Dettinger, Fransisca Kaufmann, MD  fluticasone (FLONASE) 50 MCG/ACT nasal spray Place 2 sprays into both nostrils at bedtime as needed for allergies or rhinitis. 02/16/22  Yes Dettinger, Fransisca Kaufmann, MD  gabapentin (NEURONTIN) 300 MG capsule Take 300 mg by mouth in the morning and at bedtime. 07/25/21  Yes [provider]  metoprolol succinate (TOPROL-XL) 50 MG 24 hr tablet Take 50 mg by mouth daily. Take with or immediately following a meal.   Yes [provider]  nabumetone (RELAFEN) 500 MG tablet TAKE 1 TABLET BY MOUTH DAILY Patient taking differently: Take 500 mg by mouth daily. 04/25/22  Yes Dettinger, Fransisca Kaufmann, MD  omeprazole (PRILOSEC) 40 MG capsule Take 1 capsule (40 mg total) by mouth daily. 11/10/21  Yes Dettinger, Fransisca Kaufmann, MD  PARoxetine (PAXIL) 20 MG tablet Take 2 tablets (40 mg total) by mouth daily. 02/16/22  Yes Dettinger, Fransisca Kaufmann, MD  tamsulosin (FLOMAX) 0.4 MG CAPS capsule Take 1 capsule (0.4 mg total) by mouth daily. 12/09/21  Yes Dettinger, Fransisca Kaufmann, MD  traZODone (DESYREL) 50 MG tablet Take 0.5-1 tablets (25-50 mg total) by mouth at bedtime as needed for sleep. 02/16/22  Yes Dettinger, Fransisca Kaufmann, MD    Physical Exam: Vitals:   05/07/22 1130 05/07/22 1200 05/07/22 1230 05/07/22 1400  BP: (!) 167/88 (!) 149/68    Pulse: 93 (!) 107 (!) 105   Resp: 20 (!) 21 19   Temp:    99.3 F (37.4 C)  TempSrc:    Rectal  SpO2: 95% 95% 91%   Weight:      Height:        Constitutional: NAD, calm, comfortable, bruise on back of scalp Eyes: PERRL, lids and conjunctivae normal ENMT: Mucous membranes are moist. No tongue/mouth lesions seen. Posterior pharynx clear of any exudate or lesions.  Neck: normal, supple, no masses, no thyromegaly Respiratory: clear  to auscultation bilaterally, no wheezing, no crackles. Normal respiratory effort. No accessory muscle use.  Cardiovascular: normal s1, s2 sounds, no murmurs / rubs / gallops. No extremity edema. 2+ pedal pulses. No carotid bruits.  Abdomen: no tenderness, no masses palpated. No hepatosplenomegaly. Bowel sounds positive.  Musculoskeletal: avulsion of left first toenail, no clubbing / cyanosis. No joint deformity upper and lower extremities. Good ROM, no contractures. Normal muscle tone.  Skin: no rashes, lesions, ulcers. No induration Neurologic: CN 2-12 grossly intact. Sensation intact, DTR normal. Strength 5/5 in all 4.  Psychiatric: Normal judgment and insight. Alert and oriented x 3. Normal mood.   Labs on Admission: I have personally reviewed following labs and imaging studies  CBC: Recent Labs  Lab 05/07/22 1115  WBC 9.8  NEUTROABS 8.3*  HGB 15.2  HCT 45.3  MCV 89.9  PLT 622   Basic Metabolic Panel: Recent Labs  Lab 05/07/22 1115  NA 138  K 4.4  CL 103  CO2 26  GLUCOSE 137*  BUN 12  CREATININE 0.95  CALCIUM 8.9  MG 2.2   GFR: Estimated Creatinine Clearance: 65 mL/min (by C-G formula based on SCr of 0.95 mg/dL). Liver Function Tests: Recent Labs  Lab 05/07/22 1115  AST 22  ALT 16  ALKPHOS 97  BILITOT 0.5  PROT 7.6  ALBUMIN 4.3   No results for input(s): "LIPASE", "AMYLASE" in the last 168 hours. No  results for input(s): "AMMONIA" in the last 168 hours. Coagulation Profile: Recent Labs  Lab 05/07/22 1152  INR 1.0   Cardiac Enzymes: No results for input(s): "CKTOTAL", "CKMB", "CKMBINDEX", "TROPONINI" in the last 168 hours. BNP (last 3 results) No results for input(s): "PROBNP" in the last 8760 hours. HbA1C: No results for input(s): "HGBA1C" in the last 72 hours. CBG: Recent Labs  Lab 05/07/22 1158  GLUCAP 147*   Lipid Profile: No results for input(s): "CHOL", "HDL", "LDLCALC", "TRIG", "CHOLHDL", "LDLDIRECT" in the last 72 hours. Thyroid Function Tests: No results for input(s): "TSH", "T4TOTAL", "FREET4", "T3FREE", "THYROIDAB" in the last 72 hours. Anemia Panel: No results for input(s): "VITAMINB12", "FOLATE", "FERRITIN", "TIBC", "IRON", "RETICCTPCT" in the last 72 hours. Urine analysis:    Component Value Date/Time   COLORURINE YELLOW 05/07/2022 1130   APPEARANCEUR CLEAR 05/07/2022 1130   LABSPEC 1.012 05/07/2022 1130   PHURINE 6.0 05/07/2022 1130   GLUCOSEU NEGATIVE 05/07/2022 1130   HGBUR NEGATIVE 05/07/2022 1130   BILIRUBINUR NEGATIVE 05/07/2022 1130   BILIRUBINUR NEG 02/19/2014 1620   KETONESUR NEGATIVE 05/07/2022 1130   PROTEINUR NEGATIVE 05/07/2022 1130   UROBILINOGEN negative 02/19/2014 1620   NITRITE NEGATIVE 05/07/2022 1130   LEUKOCYTESUR NEGATIVE 05/07/2022 1130    Radiological Exams on Admission: CT HEAD WO CONTRAST  Result Date: 05/07/2022 CLINICAL DATA:  Head trauma, minor (Age >= 65y); Neck trauma (Age >= 65y) EXAM: CT HEAD WITHOUT CONTRAST CT CERVICAL SPINE WITHOUT CONTRAST TECHNIQUE: Multidetector CT imaging of the head and cervical spine was performed following the standard protocol without intravenous contrast. Multiplanar CT image reconstructions of the cervical spine were also generated. RADIATION DOSE REDUCTION: This exam was performed according to the departmental dose-optimization program which includes automated exposure control, adjustment of  the mA and/or kV according to patient size and/or use of iterative reconstruction technique. COMPARISON:  CT cervical spine August 25, 2015. FINDINGS: CT HEAD FINDINGS Brain: No evidence of acute infarction, hemorrhage, hydrocephalus, extra-axial collection or mass lesion/mass effect. Vascular: No hyperdense vessel identified. Calcific atherosclerosis. Skull: Left-sided scalp contusion without acute calvarial fracture.  Sinuses/Orbits: Clear visualized sinuses. No acute orbital findings. Other: No mastoid effusions. CT CERVICAL SPINE FINDINGS Alignment: No substantial sagittal subluxation. Skull base and vertebrae: No evidence of acute fracture. Mild height loss of the T2 vertebral body appears similar to prior radiographs. Soft tissues and spinal canal: No prevertebral fluid or swelling. No visible canal hematoma. Disc levels: Bulky bridging anterior osteophytes throughout the cervical spine. Degenerative disc disease greatest in the lower cervical spine. Multilevel endplate spurring. Multilevel facet and uncovertebral hypertrophy with varying degrees of neural foraminal stenosis. Upper chest: Visualized lung apices are clear. IMPRESSION: CT head: 1. No evidence of acute intracranial abnormality. 2. Left-sided scalp contusion without acute calvarial fracture CT cervical spine: 1. No evidence of acute fracture or traumatic malalignment. 2. Multilevel degenerative change, detailed above. Electronically Signed   By: Margaretha Sheffield M.D.   On: 05/07/2022 11:04   CT CERVICAL SPINE WO CONTRAST  Result Date: 05/07/2022 CLINICAL DATA:  Head trauma, minor (Age >= 65y); Neck trauma (Age >= 65y) EXAM: CT HEAD WITHOUT CONTRAST CT CERVICAL SPINE WITHOUT CONTRAST TECHNIQUE: Multidetector CT imaging of the head and cervical spine was performed following the standard protocol without intravenous contrast. Multiplanar CT image reconstructions of the cervical spine were also generated. RADIATION DOSE REDUCTION: This exam was  performed according to the departmental dose-optimization program which includes automated exposure control, adjustment of the mA and/or kV according to patient size and/or use of iterative reconstruction technique. COMPARISON:  CT cervical spine August 25, 2015. FINDINGS: CT HEAD FINDINGS Brain: No evidence of acute infarction, hemorrhage, hydrocephalus, extra-axial collection or mass lesion/mass effect. Vascular: No hyperdense vessel identified. Calcific atherosclerosis. Skull: Left-sided scalp contusion without acute calvarial fracture. Sinuses/Orbits: Clear visualized sinuses. No acute orbital findings. Other: No mastoid effusions. CT CERVICAL SPINE FINDINGS Alignment: No substantial sagittal subluxation. Skull base and vertebrae: No evidence of acute fracture. Mild height loss of the T2 vertebral body appears similar to prior radiographs. Soft tissues and spinal canal: No prevertebral fluid or swelling. No visible canal hematoma. Disc levels: Bulky bridging anterior osteophytes throughout the cervical spine. Degenerative disc disease greatest in the lower cervical spine. Multilevel endplate spurring. Multilevel facet and uncovertebral hypertrophy with varying degrees of neural foraminal stenosis. Upper chest: Visualized lung apices are clear. IMPRESSION: CT head: 1. No evidence of acute intracranial abnormality. 2. Left-sided scalp contusion without acute calvarial fracture CT cervical spine: 1. No evidence of acute fracture or traumatic malalignment. 2. Multilevel degenerative change, detailed above. Electronically Signed   By: Margaretha Sheffield M.D.   On: 05/07/2022 11:04   DG Chest 1 View  Result Date: 05/07/2022 CLINICAL DATA:  Fall.  Pain between the shoulder blades. EXAM: CHEST  1 VIEW COMPARISON:  10/18/2021 FINDINGS: 1028 hours. The lungs are clear without focal pneumonia, edema, pneumothorax or pleural effusion. The cardiopericardial silhouette is within normal limits for size. The visualized bony  structures of the thorax are unremarkable. Telemetry leads overlie the chest. IMPRESSION: No active disease. Electronically Signed   By: Misty Stanley M.D.   On: 05/07/2022 11:02    EKG: Independently reviewed. Sinus tachy with RBBB  Assessment/Plan Principal Problem:   New onset seizure ([place]) Active Problems:   Syncopal episodes   HTN (hypertension)   HLD (hyperlipidemia)   Prediabetes   Gout   GERD (gastroesophageal reflux disease)   Anxiety   Neuropathy   Concussion   Nail avulsion of toe   Fall at home   Postictal confusion   Sinus tachycardia   BPH (benign  prostatic hyperplasia)   Assessment and Plan: * New onset seizure [facility]) -- Patient had a witnessed tonic-clonic seizure while being triaged in the emergency department.  He likely had a seizure at home that prompted his syncopal episode.  He could also be having a postconcussive seizure. -- Is being admitted to [facility] per neurology recommendation for neurology consultation MRI brain, EEG and further work-up -- I am sending him for CTA of the head and neck while he is waiting for bed at [facility]. -- His metabolic work-up so far has been unrevealing. -- Continue to monitor on telemetry -- Continue neurochecks -- Plan to get MRI brain when he [facility] at [facility]. -- Further recommendations to follow -- If patient develops fever ED provider planning to do lumbar puncture  Syncopal episodes -- possibly secondary to seizures, full work up planned -- fall precautions, neuro checks, EEG, CTA head/neck, TTE  BPH (benign prostatic hyperplasia) -- resume tamsulosin   Sinus tachycardia -- reactive from acute events, treat with hydration, rest, monitor telemetry -- resume home metoprolol and diltiazem and get orthostatic vitals done.   Postictal confusion -- improving over time, continue neuro checks   Fall at home -- working up for epilepsy and completing a syncope work up -- fall precautions / seizure  precautions recommended  Nail avulsion of toe -- treated in ED, local wound care   Concussion -- pt his back of head in fall at home -- supportive measures, neuro checks   Neuropathy -- pt reports pregabalin doesn't help as much as gabapentin and planning to discuss changing back to gabapentin with his primary providers  Anxiety -- resume home buspirone  GERD (gastroesophageal reflux disease) -- For GI protection  Gout -- restarting home allopurinol  Prediabetes -- carb modified diet, avoid concentrated sweets  HLD (hyperlipidemia) -- fasting lipid profile -- resume home meds   HTN (hypertension) -- resume home meds and follow   DVT prophylaxis: enoxaparin  Code Status: Full   Family Communication: wife / son updated 7/9  Disposition Plan: anticipate home   Consults called: neurology   Admission status: INP  Level of care: Telemetry Medical Irwin Brakeman MD [facility] How to contact the [facility] Attending or Consulting provider [place] or covering provider during after hours 7P -7A, for this patient?  Check the care team in [facility] and look for a) attending/consulting [facility] provider listed and b) the [facility] team listed Log into [email or web address] and use [place]'s universal password to access. If you do not have the password, please contact the hospital operator. Locate the [facility] provider you are looking for under [facility] and page to a number that you can be directly reached. If you still have difficulty reaching the provider, please page the [facility] (Director on Call) for the Hospitalists listed on amion for assistance.   If 7PM-7AM, please contact night-coverage [email or web address] Password [email or web address]  05/07/2022, 3:05 PM

## 2022-05-07 NOTE — Assessment & Plan Note (Signed)
--   possibly secondary to seizures, full work up planned -- fall precautions, neuro checks, EEG, CTA head/neck, TTE

## 2022-05-07 NOTE — ED Notes (Signed)
Wife Jonathan Love would like to be kept updated with any changes with patient's condition or admission status. [phone]

## 2022-05-07 NOTE — Assessment & Plan Note (Signed)
--   pt his back of head in fall at home -- supportive measures, neuro checks

## 2022-05-07 NOTE — Assessment & Plan Note (Signed)
--   fasting lipid profile -- resume home meds

## 2022-05-07 NOTE — Assessment & Plan Note (Signed)
--   improving over time, continue neuro checks

## 2022-05-07 NOTE — Assessment & Plan Note (Signed)
--   working up for epilepsy and completing a syncope work up -- fall precautions / seizure precautions recommended

## 2022-05-07 NOTE — Assessment & Plan Note (Signed)
--   resume home buspirone ?

## 2022-05-07 NOTE — Assessment & Plan Note (Addendum)
--   reactive from acute events, treat with hydration, rest, monitor telemetry -- resume home metoprolol and diltiazem and get orthostatic vitals done.

## 2022-05-07 NOTE — Assessment & Plan Note (Signed)
--   For GI protection

## 2022-05-07 NOTE — ED Notes (Signed)
Patient now drowsy but verbally responding to name. Patient confused to place and situation at this time.

## 2022-05-07 NOTE — Assessment & Plan Note (Signed)
--   treated in ED, local wound care

## 2022-05-07 NOTE — Assessment & Plan Note (Signed)
--   resume tamsulosin

## 2022-05-08 ENCOUNTER — Inpatient Hospital Stay (HOSPITAL_COMMUNITY)
Admit: 2022-05-08 | Discharge: 2022-05-08 | Disposition: A | Discharge status: Home / Self Care | Payer: Medicare Other | Attending: Family Medicine | Admitting: Family Medicine

## 2022-05-08 ENCOUNTER — Inpatient Hospital Stay (HOSPITAL_BASED_OUTPATIENT_CLINIC_OR_DEPARTMENT_OTHER): Payer: Medicare Other

## 2022-05-08 ENCOUNTER — Inpatient Hospital Stay (HOSPITAL_COMMUNITY): Payer: Medicare Other

## 2022-05-08 DIAGNOSIS — R000 Tachycardia, unspecified: Secondary | ICD-10-CM | POA: Diagnosis not present

## 2022-05-08 DIAGNOSIS — R55 Syncope and collapse: Secondary | ICD-10-CM | POA: Diagnosis not present

## 2022-05-08 DIAGNOSIS — E7800 Pure hypercholesterolemia, unspecified: Secondary | ICD-10-CM

## 2022-05-08 DIAGNOSIS — R7303 Prediabetes: Secondary | ICD-10-CM | POA: Diagnosis not present

## 2022-05-08 DIAGNOSIS — R569 Unspecified convulsions: Secondary | ICD-10-CM | POA: Diagnosis not present

## 2022-05-08 DIAGNOSIS — F05 Delirium due to known physiological condition: Secondary | ICD-10-CM | POA: Diagnosis not present

## 2022-05-08 DIAGNOSIS — N400 Enlarged prostate without lower urinary tract symptoms: Secondary | ICD-10-CM | POA: Diagnosis not present

## 2022-05-08 LAB — BASIC METABOLIC PANEL
Anion gap: 6 (ref 5–15)
BUN: 10 mg/dL (ref 8–23)
CO2: 25 mmol/L (ref 22–32)
Calcium: 8.5 mg/dL — ABNORMAL LOW (ref 8.9–10.3)
Chloride: 107 mmol/L (ref 98–111)
Creatinine, Ser: 1.02 mg/dL (ref 0.61–1.24)
GFR, Estimated: >60 mL/min (ref >60)
Glucose, Bld: 107 mg/dL — ABNORMAL HIGH (ref 70–99)
Potassium: 3.6 mmol/L (ref 3.5–5.1)
Sodium: 138 mmol/L (ref 135–145)

## 2022-05-08 LAB — ECHOCARDIOGRAM COMPLETE
AR max vel: 2.8 cm2
AV Area VTI: 2.58 cm2
AV Area mean vel: 2.59 cm2
AV Mean grad: 5 mmHg
AV Peak grad: 9.9 mmHg
Ao pk vel: 1.57 m/s
Area-P 1/2: 2.82 cm2
Calc EF: 65 %
Height: 68 in
MV VTI: 2.69 cm2
S' Lateral: 2.8 cm
Single Plane A2C EF: 65.3 %
Single Plane A4C EF: 65.5 %
Weight: 2800 oz

## 2022-05-08 LAB — LIPID PANEL
Cholesterol: 146 mg/dL (ref 0–200)
HDL: 46 mg/dL (ref >40)
LDL Cholesterol: 61 mg/dL (ref 0–99)
Total CHOL/HDL Ratio: 3.2 RATIO
Triglycerides: 197 mg/dL — ABNORMAL HIGH (ref <150)
VLDL: 39 mg/dL (ref 0–40)

## 2022-05-08 LAB — MAGNESIUM: Magnesium: 2.4 mg/dL (ref 1.7–2.4)

## 2022-05-08 LAB — HEMOGLOBIN A1C
Hgb A1c MFr Bld: 5.8 % — ABNORMAL HIGH (ref 4.8–5.6)
Mean Plasma Glucose: 119.76 mg/dL

## 2022-05-08 MED ORDER — LEVETIRACETAM 500 MG PO TABS: 500.0000 mg | ORAL_TABLET | Freq: Two times a day (BID) | ORAL | 1 refills | Status: DC

## 2022-05-08 MED ORDER — GABAPENTIN 300 MG PO CAPS: 300.0000 mg | ORAL_CAPSULE | Freq: Three times a day (TID) | ORAL | 0 refills | Status: DC

## 2022-05-08 MED ORDER — LEVETIRACETAM 500 MG PO TABS
500.0000 mg | ORAL_TABLET | Freq: Two times a day (BID) | ORAL | Status: DC
  Administered 2022-05-08: 500 mg via ORAL
  Filled 2022-05-08: qty 1

## 2022-05-08 MED ORDER — GABAPENTIN 300 MG PO CAPS
300.0000 mg | ORAL_CAPSULE | Freq: Three times a day (TID) | ORAL | Status: DC
  Administered 2022-05-08: 300 mg via ORAL
  Filled 2022-05-08: qty 1

## 2022-05-08 NOTE — Evaluation (Signed)
Physical Therapy Evaluation Patient Details Name: Jonathan Love MRN: [id number] DOB: [birth year] Today's Date: 05/08/2022  History of Present Illness  Jonathan Love is a 76 year old gentleman living at home with his wife experienced an unwitnessed fall and syncopal episode at home.  Wife apparently heard the patient fall with a loud thud and found the patient unresponsive on the floor in his bedroom.  He had lost consciousness for approximately 12 minutes.  EMS was called and they found him to be arousable to name but very confused.  Over the course of the next few minutes he started to awaken and recall his name and gradually return to his baseline.  His blood sugar at the time was 137.  There was a right bundle branch block on EKG.  He hit the back of his head.  He also had an avulsion to his great left toe and complained of pain.   Clinical Impression  Patient presents supine in bed with family members present. Patient consents to PT evaluation. Patient is min assist with bed mobility due to decreased trunk control requiring HHA to reach sitting position. Improved trunk control in sitting with ability to scoot to EOB. Limited by upper back discomfort secondary to recent fall. Patient is modified independent with transfers using quad cane. Demonstrates appropriate strength needed to complete transfers safely. Patient was able to ambulate 150 feet in hallway with supervision and use of quad cane. Gait pattern close to WNL with minor gait deviations observed. Fair coordination with minor balance deficits. Patient left sitting EOB with family present and nursing staff notified of mobility status. Patient will benefit from continued skilled physical therapy in hospital to increase strength, balance, endurance for safe ADLs and gait.      Recommendations for follow up therapy are one component of a multi-disciplinary discharge planning process, led by the attending physician.  Recommendations may be  updated based on patient status, additional functional criteria and insurance authorization.  Follow Up Recommendations No PT follow up      Assistance Recommended at Discharge PRN  Patient can return home with the following  A little help with walking and/or transfers;Help with stairs or ramp for entrance    Equipment Recommendations None recommended by PT  Recommendations for Other Services       Functional Status Assessment Patient has not had a recent decline in their functional status     Precautions / Restrictions Precautions Precautions: Fall Restrictions Weight Bearing Restrictions: No      Mobility  Bed Mobility Overal bed mobility: Needs Assistance Bed Mobility: Supine to Sit     Supine to sit: Min assist     General bed mobility comments: Min assist with bed mobility due to decreased trunk control requiring HHA to reach sitting position. Improved trunk control with ability to scoot to EOB. Limited by upper back discomfort secondary to recent fall.    Transfers Overall transfer level: Modified independent Equipment used: Quad cane               General transfer comment: Modified independent with transfers using quad cane. Demonstrated appropriate strength needed to complete transfers.    Ambulation/Gait Ambulation/Gait assistance: Supervision Gait Distance (Feet): 150 Feet Assistive device: Quad cane Gait Pattern/deviations: Decreased step length - right, Decreased step length - left, Step-through pattern, Decreased stride length, Trunk flexed, Drifts right/left Gait velocity: slightly decreased     General Gait Details: Able to ambulate 150 feet in hallway with supervision and use of quad  cane. Gait pattern close to WNL with minor gait deviations observed. Fair coordination with mild balance deficits.  Stairs            Wheelchair Mobility    Modified Rankin (Stroke Patients Only)       Balance Overall balance assessment: Mild  deficits observed, not formally tested                                           Pertinent Vitals/Pain Pain Assessment Pain Assessment: Faces Faces Pain Scale: Hurts little more Pain Location: Upper back Pain Descriptors / Indicators: Aching Pain Intervention(s): Limited activity within patient's tolerance, Monitored during session, Repositioned    Home Living Family/patient expects to be discharged to:: Private residence Living Arrangements: Spouse/significant other;Children Available Help at Discharge: Family;Available PRN/intermittently;Available 24 hours/day Type of Home: House Home Access: Level entry       Home Layout: One level Home Equipment: Cane - quad;Shower seat      Prior Function Prior Level of Function : Independent/Modified Independent             Mobility Comments: Patient reports being able to ambulate at home and in the community independently with quad cane. Currently driving. ADLs Comments: Patient reports being able to complete ADL's at home with assistance from wife prn.     Hand Dominance   Dominant Hand: Right    Extremity/Trunk Assessment   Upper Extremity Assessment Upper Extremity Assessment: Overall WFL for tasks assessed    Lower Extremity Assessment Lower Extremity Assessment: Overall WFL for tasks assessed    Cervical / Trunk Assessment Cervical / Trunk Assessment: Normal  Communication   Communication: No difficulties  Cognition Arousal/Alertness: Awake/alert Behavior During Therapy: WFL for tasks assessed/performed Overall Cognitive Status: Within Functional Limits for tasks assessed                                          General Comments      Exercises     Assessment/Plan    PT Assessment Patient does not need any further PT services  PT Problem List Decreased strength;Decreased activity tolerance;Decreased balance;Decreased mobility;Decreased coordination       PT  Treatment Interventions DME instruction;Gait training;Functional mobility training;Therapeutic activities;Therapeutic exercise;Balance training    PT Goals (Current goals can be found in the Care Plan section)  Acute Rehab PT Goals Patient Stated Goal: return home PT Goal Formulation: With patient Time For Goal Achievement: 05/15/22    Frequency Min 2X/week     Co-evaluation               AM-PAC PT "6 Clicks" Mobility  Outcome Measure Help needed turning from your back to your side while in a flat bed without using bedrails?: A Little Help needed moving from lying on your back to sitting on the side of a flat bed without using bedrails?: A Little Help needed moving to and from a bed to a chair (including a wheelchair)?: A Little Help needed standing up from a chair using your arms (e.g., wheelchair or bedside chair)?: A Little Help needed to walk in hospital room?: A Little Help needed climbing 3-5 steps with a railing? : A Little 6 Click Score: 18    End of Session   Activity Tolerance: Patient tolerated treatment  well;No increased pain Patient left: in chair Nurse Communication: Mobility status PT Visit Diagnosis: Unsteadiness on feet (R26.81);Other abnormalities of gait and mobility (R26.89);Muscle weakness (generalized) (M62.81)    Time: 0867-6195 PT Time Calculation (min) (ACUTE ONLY): 20 min   Charges:   PT Evaluation $PT Eval Moderate Complexity: 1 Mod PT Treatments $Therapeutic Activity: 8-22 mins        3:42 PM, 05/08/22 Lestine Box, S/PT

## 2022-05-08 NOTE — Consult Note (Addendum)
I connected with  Jonathan Love on 05/08/22 by a video enabled telemedicine application and verified that I am speaking with the correct person using two identifiers.   I discussed the limitations of evaluation and management by telemedicine. The patient expressed understanding and agreed to proceed.  Location of patient: [facility] Location of physician: [facility]  Neurology Consultation Reason for Consult: Seizure Referring Physician: Dr. Godfrey Pick  CC: Seizure  History is obtained from: Patient, wife at bedside, chart review  HPI: Jonathan Love is a 76 y.o. male with past medical history of hypertension, hyperlipidemia, diabetes, right cheek melanoma status postresection, neuropathy who was brought in yesterday morning due to confusion.  Per wife, she heard a loud sound in his room and went to check on him.  He was not responding and therefore she called EMS.  On arrival to the hospital, his blood pressure and CBG was within normal limits.  He was starting to improve.  Of note, per review of note, estimated duration of loss of consciousness/confusion was about 12 minutes.  While in the emergency room, h loud noise was heard from his room and when RN went in, patient had whole body jerking, leg started blue, oxygen saturation dropping to 30s.  He was given 2 mg of IV Ativan.  Patient denies prior history of seizures, feeling sick recently, headache, neck stiffness.  Does report pain in his shoulders likely due to fall.  Denies any new medications.  Patient also reports painful neuropathy and states gabapentin has helped but not enough.  ROS: All other systems reviewed and negative except as noted in the HPI.   Past Medical History:  Diagnosis Date   Anxiety    Cataract    CTS (carpal tunnel syndrome)    left   Diverticulitis    GERD (gastroesophageal reflux disease)    Gout    H/O agent Orange exposure    Hyperlipidemia    Hypertension    Melanoma ([place])  04/2020   right cheek   Migraine    Neuropathy    OAB (overactive bladder)    Prediabetes     Family History  Problem Relation Age of Onset   Stomach cancer Father    Lung cancer Father    Heart disease Mother     Social History:  reports that he has never smoked. He has never used smokeless tobacco. He reports that he does not drink alcohol and does not use drugs.   Medications Prior to Admission  Medication Sig Dispense Refill Last Dose   allopurinol (ZYLOPRIM) 300 MG tablet Take 1 tablet (300 mg total) by mouth daily. 90 tablet 3 05/06/2022   atorvastatin (LIPITOR) 40 MG tablet Take 1 tablet (40 mg total) by mouth daily. 90 tablet 3 05/06/2022   benazepril (LOTENSIN) 40 MG tablet TAKE 1 TABLET BY MOUTH  DAILY (Patient taking differently: Take 40 mg by mouth daily.) 100 tablet 0 05/06/2022   busPIRone (BUSPAR) 15 MG tablet Take 1 tablet (15 mg total) by mouth 2 (two) times daily. 60 tablet 2 05/06/2022   Coenzyme Q10-Vitamin E (QUNOL ULTRA COQ10 PO) Take 1 capsule by mouth at bedtime.   05/06/2022   diltiazem (CARDIZEM CD) 180 MG 24 hr capsule TAKE 1 CAPSULE BY MOUTH DAILY (Patient taking differently: Take 180 mg by mouth daily.) 90 capsule 0 05/06/2022   fluticasone (FLONASE) 50 MCG/ACT nasal spray Place 2 sprays into both nostrils at bedtime as needed for allergies or rhinitis.  16 g 6 05/06/2022   gabapentin (NEURONTIN) 300 MG capsule Take 300 mg by mouth in the morning and at bedtime.   05/06/2022   metoprolol succinate (TOPROL-XL) 50 MG 24 hr tablet Take 50 mg by mouth daily. Take with or immediately following a meal.   05/06/2022 at 0800   nabumetone (RELAFEN) 500 MG tablet TAKE 1 TABLET BY MOUTH DAILY (Patient taking differently: Take 500 mg by mouth daily.) 90 tablet 0 05/06/2022   omeprazole (PRILOSEC) 40 MG capsule Take 1 capsule (40 mg total) by mouth daily. 90 capsule 3 05/06/2022   PARoxetine (PAXIL) 20 MG tablet Take 2 tablets (40 mg total) by mouth daily. 180 tablet 3 05/06/2022   tamsulosin  (FLOMAX) 0.4 MG CAPS capsule Take 1 capsule (0.4 mg total) by mouth daily. 90 capsule 3 05/06/2022   traZODone (DESYREL) 50 MG tablet Take 0.5-1 tablets (25-50 mg total) by mouth at bedtime as needed for sleep. 30 tablet 3 05/06/2022      Exam: Current vital signs: BP (!) 146/86 (BP Location: Right Arm)   Pulse 60   Temp 98.5 F (36.9 C)   Resp 18   Ht '5\' 8"'$  (1.727 m)   Wt 79.4 kg   SpO2 100%   BMI 26.61 kg/m  Vital signs in last 24 hours: Temp:  [98.5 F (36.9 C)-99.3 F (37.4 C)] 98.5 F (36.9 C) (07/10 0932) Pulse Rate:  [55-107] 60 (07/10 0932) Resp:  [8-21] 18 (07/10 0932) BP: (97-179)/(49-92) 146/86 (07/10 0932) SpO2:  [90 %-100 %] 100 % (07/10 0932)   Physical Exam  Constitutional: Appears well-developed and well-nourished.  Psych: Affect appropriate to situation Eyes: No scleral injection Neuro: AOx3, cranial nerves II to XII are grossly intact, antigravity strength in all 4 extremities without drift, FTN intact bilateral  I have reviewed labs in epic and the results pertinent to this consultation are: CBC:  Recent Labs  Lab 05/07/22 1115  WBC 9.8  NEUTROABS 8.3*  HGB 15.2  HCT 45.3  MCV 89.9  PLT 735    Basic Metabolic Panel:  Lab Results  Component Value Date   NA 138 05/08/2022   K 3.6 05/08/2022   CO2 25 05/08/2022   GLUCOSE 107 (H) 05/08/2022   BUN 10 05/08/2022   CREATININE 1.02 05/08/2022   CALCIUM 8.5 (L) 05/08/2022   GFRNONAA >60 05/08/2022   GFRAA 83 07/09/2020   Lipid Panel:  Lab Results  Component Value Date   LDLCALC 61 05/08/2022   HgbA1c:  Lab Results  Component Value Date   HGBA1C 5.8 (H) 05/07/2022   Urine Drug Screen: No results found for: "LABOPIA", "COCAINSCRNUR", "LABBENZ", "AMPHETMU", "THCU", "LABBARB"  Alcohol Level No results found for: "ETH"   I have reviewed the images obtained:  MRI brain without contrast 05/08/2022: Left scalp edema/hematoma. No evidence of underlying skull fracture. No traumatic intracranial  finding. Mild to low moderate chronic small-vessel ischemic changes of the cerebral hemispheric white matter, most prominent in the left frontal region. Age related brain volume loss. No finding to specifically explain seizure.  ASSESSMENT/PLAN: 76 year old male with no prior history of epilepsy who presented with an episode of loss of consciousness and then had witnessed generalized tonic-clonic seizure while in the emergency room.  New onset seizures -No clear provoking factors.  Recommendations: -Ideally, two unprovoked seizures within 24 hours does not necessarily need an antiepileptic drug.  However, when I discussed the risk of seizure recurrence versus being on medications, patient states he would prefer starting medications  right now. -Therefore, recommend starting Keppra 500 mg twice daily -Routine EEG ordered and pending -Discussed seizure precautions including do not drive -Management of rest of comorbidities per primary team -Recommend follow-up with neurology in 3 months  Peripheral neuropathy - Patient reports gabapentin is not helping enough with his neuropathy.  Therefore recommend increasing to 300 mg 3 times daily and follow-up with primary neurologist/primary care physician.   Thank you for allowing [place] to participate in the care of this patient. If you have any further questions, please contact  me or neurohospitalist.   Zeb [facility] neurohospitalist

## 2022-05-08 NOTE — Progress Notes (Signed)
Pt has discharge orders, discharge teaching given and no further questions at this time. Pt wheeled down to main entrance to vehicle accompanied by pts son.

## 2022-05-08 NOTE — Progress Notes (Signed)
*  PRELIMINARY RESULTS* Echocardiogram 2D Echocardiogram has been performed.  Jonathan Love 05/08/2022, 2:35 PM

## 2022-05-08 NOTE — Discharge Instructions (Addendum)
No driving or operating machinery for 6 months or unless cleared by neurologist   IMPORTANT INFORMATION: PAY CLOSE [place] INSTRUCTIONS  Follow with Primary care provider  Dettinger, Fransisca Kaufmann, MD  and other consultants as instructed by your Hospitalist Physician  [place] IF SYMPTOMS COME BACK, WORSEN OR NEW PROBLEM DEVELOPS   Please note: You were cared for by a hospitalist during your hospital stay. Every effort will be made to forward records to your primary care provider.  You can request that your primary care provider send for your hospital records if they have not received them.  Once you are discharged, your primary care physician will handle any further medical issues. Please note that NO REFILLS for any discharge medications will be authorized once you are discharged, as it is imperative that you return to your primary care physician (or establish a relationship with a primary care physician if you do not have one) for your post hospital discharge needs so that they can reassess your need for medications and monitor your lab values.  Please get a complete blood count and chemistry panel checked by your Primary MD at your next visit, and again as instructed by your Primary MD.  Get Medicines reviewed and adjusted: Please take all your medications with you for your next visit with your Primary MD  Laboratory/radiological data: Please request your Primary MD to go over all hospital tests and procedure/radiological results at the follow up, please ask your primary care provider to get all Hospital records sent to his/her office.  In some cases, they will be blood work, cultures and biopsy results pending at the time of your discharge. Please request that your primary care provider follow up on these results.  If you are diabetic, please bring your blood sugar readings with you to your follow up appointment with primary care.     Please call and make your follow up appointments as soon as possible.    Also Note the following: If you experience worsening of your admission symptoms, develop shortness of breath, life threatening emergency, suicidal or homicidal thoughts you must seek medical attention immediately by calling 911 or calling your MD immediately  if symptoms less severe.  You must read complete instructions/literature along with all the possible adverse reactions/side effects for all the Medicines you take and that have been prescribed to you. Take any new Medicines after you have completely understood and accpet all the possible adverse reactions/side effects.   Do not drive when taking Pain medications or sleeping medications (Benzodiazepines)  Do not take more than prescribed Pain, Sleep and Anxiety Medications. It is not advisable to combine anxiety,sleep and pain medications without talking with your primary care practitioner  Special Instructions: If you have smoked or chewed Tobacco  in the last 2 yrs please stop smoking, stop any regular Alcohol  and or any Recreational drug use.  Wear Seat belts while driving.  Do not drive if taking any narcotic, mind altering or controlled substances or recreational drugs or alcohol.

## 2022-05-08 NOTE — TOC Progression Note (Signed)
  Transition of Care [facility]) Screening Note   Patient Details  Name: BLAYDEN CONWELL Date of Birth: December 21, 1945   [facility] CM/SW Contact:    Shade Flood, LCSW Phone Number: 05/08/2022, 11:43 AM    Transition of Care Department [facility]) has reviewed patient and no TOC needs have been identified at this time. We will continue to monitor patient advancement through interdisciplinary progression rounds. If new patient transition needs arise, please place a TOC consult.

## 2022-05-08 NOTE — Progress Notes (Signed)
EEG complete - results pending 

## 2022-05-08 NOTE — Care Management Obs Status (Signed)
[place] NOTIFICATION   Patient Details  Name: Jonathan Love MRN: [id number] Date of Birth: [birth year]   Medicare Observation Status Notification Given:  Yes    Roseanne Kaufman, RN 05/08/2022, 6:10 PM

## 2022-05-08 NOTE — Discharge Summary (Signed)
Physician Discharge Summary  Jonathan Love IWP:[id number] DOB: Feb 15, 1946 DOA: 05/07/2022  PCP: Dettinger, Fransisca Kaufmann, MD  Admit date: 05/07/2022 Discharge date: 05/08/2022  Admitted From:  Home  Disposition: Home   Recommendations for Outpatient Follow-up:  Follow up with PCP in 1 weeks Follow up with neurology in 3 months No driving or operating machinery for 6 months or until cleared by neurologist  Discharge Condition: STABLE   CODE STATUS: FULL DIET: heart healthy    Brief Hospitalization Summary: Please see all hospital notes, images, labs for full details of the hospitalization. HPI: Jonathan Love is a 76 year old gentleman living at home with his wife experienced an unwitnessed fall and syncopal episode at home.  Wife apparently heard the patient fall with a loud thud and found the patient unresponsive on the floor in his bedroom.  He had lost consciousness for approximately 12 minutes.  EMS was called and they found him to be arousable to name but very confused.  Over the course of the next few minutes he started to awaken and recall his name and gradually return to his baseline.  His blood sugar at the time was 137.  There was a right bundle branch block on EKG.  He hit the back of his head.  He also had an avulsion to his great left toe and complained of pain.   While being worked up in the emergency department there was a loud noise heard in the exam room and family came out to get the nurse and they found the patient convulsing making gurgling sounds and jerking in the bed.  His oxygen saturation dropped into the 30s.  His lips turned blue.  A nonrebreather was placed on the patient.  Fortunately his oxygen saturation rebounded to 100% and he was postictal.  He gradually returned to baseline.  He was given 2 mg of Ativan but that was after he had started recovering from the seizure.  He did not receive antiepileptic medication.   Neurology was consulted who recommended patient be  admitted to [facility] for neurology consultation recommended CTA head and neck and MRI brain and EEG.  If patient were to have further seizure would load with Keppra per neurology recommendations.  Patient is currently afebrile but if he were to develop a fever the ED physician would proceed with lumbar puncture.  At  this time he is afebrile with normal WBC.     Pt reports remote history of syncope from many years ago and a remote history of migraine headaches but reports he no longer has headaches.  He reports that he has prediabetes and he has neuropathy in the feet.   Hospital Course  Patient was admitted for syncopal work-up and for new onset seizure.  He had a work-up in the hospital that was reassuring he was also seen by the inpatient neurologist and had an evaluation including an EEG study.  Neurology recommended that he start Keppra 500 mg twice daily after counseling with the patient he decided that he wanted to take the medication and try to avoid having a seizure in the future.  Neurology recommended that he follow-up outpatient with neurology in 3 months.  He was counseled to avoid driving or operating machinery for the next 6 months or until he is cleared by neurologist that it is okay to start driving again.  He verbalized understanding.  He is in stable condition.  He has had no further episodes.  His MRI brain did not show  any abnormalities that could contribute to seizure.  He is stable to discharge home.  TTE 05/08/2022    1. Left ventricular ejection fraction, by estimation, is 60 to 65%. The  left ventricle has normal function. The left ventricle has no regional  wall motion abnormalities. There is mild left ventricular hypertrophy.  Left ventricular diastolic parameters  are consistent with Grade I diastolic dysfunction (impaired relaxation).   2. Right ventricular systolic function is normal. The right ventricular  size is normal. There is normal pulmonary artery systolic  pressure.   3. Left atrial size was mildly dilated.   4. The mitral valve is normal in structure. Mild mitral valve  regurgitation.   5. The aortic valve is tricuspid. Aortic valve regurgitation is not  visualized.   Discharge Diagnoses:  Principal Problem:   New onset seizure ([place]) Active Problems:   Syncopal episodes   HTN (hypertension)   HLD (hyperlipidemia)   Prediabetes   Gout   GERD (gastroesophageal reflux disease)   Anxiety   Neuropathy   Concussion   Nail avulsion of toe   Fall at home   Postictal confusion   Sinus tachycardia   BPH (benign prostatic hyperplasia)   Discharge Instructions:  Allergies as of 05/08/2022       Reactions   Penicillins Other (See Comments)   Seizures        Medication List     TAKE these medications    allopurinol 300 MG tablet Commonly known as: ZYLOPRIM Take 1 tablet (300 mg total) by mouth daily.   atorvastatin 40 MG tablet Commonly known as: LIPITOR Take 1 tablet (40 mg total) by mouth daily.   benazepril 40 MG tablet Commonly known as: LOTENSIN TAKE 1 TABLET BY MOUTH  DAILY   busPIRone 15 MG tablet Commonly known as: BUSPAR Take 1 tablet (15 mg total) by mouth 2 (two) times daily.   diltiazem 180 MG 24 hr capsule Commonly known as: CARDIZEM CD TAKE 1 CAPSULE BY MOUTH DAILY   fluticasone 50 MCG/ACT nasal spray Commonly known as: FLONASE Place 2 sprays into both nostrils at bedtime as needed for allergies or rhinitis.   gabapentin 300 MG capsule Commonly known as: NEURONTIN Take 1 capsule (300 mg total) by mouth 3 (three) times daily. What changed: when to take this   levETIRAcetam 500 MG tablet Commonly known as: KEPPRA Take 1 tablet (500 mg total) by mouth 2 (two) times daily.   metoprolol succinate 50 MG 24 hr tablet Commonly known as: TOPROL-XL Take 50 mg by mouth daily. Take with or immediately following a meal.   nabumetone 500 MG tablet Commonly known as: RELAFEN TAKE 1 TABLET BY MOUTH  DAILY   omeprazole 40 MG capsule Commonly known as: PRILOSEC Take 1 capsule (40 mg total) by mouth daily.   PARoxetine 20 MG tablet Commonly known as: PAXIL Take 2 tablets (40 mg total) by mouth daily.   QUNOL ULTRA COQ10 PO Take 1 capsule by mouth at bedtime.   tamsulosin 0.4 MG Caps capsule Commonly known as: FLOMAX Take 1 capsule (0.4 mg total) by mouth daily.   traZODone 50 MG tablet Commonly known as: DESYREL Take 0.5-1 tablets (25-50 mg total) by mouth at bedtime as needed for sleep.        Follow-up Information     Dettinger, Fransisca Kaufmann, MD. Schedule an appointment as soon as possible for a visit in 1 week(s).   Specialties: Family Medicine, Cardiology Why: Hospital Follow Up Contact information: [place]  [place] [address] [phone]         [facility]. Schedule an appointment as soon as possible for a visit in 1 month(s).   Why: Hospital Follow Up Contact information: [address]     [address] [place] [address] [phone]               Allergies  Allergen Reactions   Penicillins Other (See Comments)    Seizures   Allergies as of 05/19/22       Reactions   Penicillins Other (See Comments)   Seizures        Medication List     TAKE these medications    allopurinol 300 MG tablet Commonly known as: ZYLOPRIM Take 1 tablet (300 mg total) by mouth daily.   atorvastatin 40 MG tablet Commonly known as: LIPITOR Take 1 tablet (40 mg total) by mouth daily.   benazepril 40 MG tablet Commonly known as: LOTENSIN TAKE 1 TABLET BY MOUTH  DAILY   busPIRone 15 MG tablet Commonly known as: BUSPAR Take 1 tablet (15 mg total) by mouth 2 (two) times daily.   diltiazem 180 MG 24 hr capsule Commonly known as: CARDIZEM CD TAKE 1 CAPSULE BY MOUTH DAILY   fluticasone 50 MCG/ACT nasal spray Commonly known as: FLONASE Place 2 sprays into both nostrils at bedtime as needed for allergies or  rhinitis.   gabapentin 300 MG capsule Commonly known as: NEURONTIN Take 1 capsule (300 mg total) by mouth 3 (three) times daily. What changed: when to take this   levETIRAcetam 500 MG tablet Commonly known as: KEPPRA Take 1 tablet (500 mg total) by mouth 2 (two) times daily.   metoprolol succinate 50 MG 24 hr tablet Commonly known as: TOPROL-XL Take 50 mg by mouth daily. Take with or immediately following a meal.   nabumetone 500 MG tablet Commonly known as: RELAFEN TAKE 1 TABLET BY MOUTH DAILY   omeprazole 40 MG capsule Commonly known as: PRILOSEC Take 1 capsule (40 mg total) by mouth daily.   PARoxetine 20 MG tablet Commonly known as: PAXIL Take 2 tablets (40 mg total) by mouth daily.   QUNOL ULTRA COQ10 PO Take 1 capsule by mouth at bedtime.   tamsulosin 0.4 MG Caps capsule Commonly known as: FLOMAX Take 1 capsule (0.4 mg total) by mouth daily.   traZODone 50 MG tablet Commonly known as: DESYREL Take 0.5-1 tablets (25-50 mg total) by mouth at bedtime as needed for sleep.        Procedures/Studies: EEG adult  Result Date: 2022-05-19 Lora Havens, MD     05-19-22  5:25 PM Patient Name: DEMOSTHENES VIRNIG MRN: [id number] Epilepsy Attending: Lora Havens Referring Physician/Provider: Murlean Iba, MD Date: 19-May-2022 Duration: 22.38 mins Patient history: 76 year old male with no prior history of epilepsy who presented with an episode of loss of consciousness and then had witnessed generalized tonic-clonic seizure while in the emergency room. EEG to evaluate for seizure Level of alertness: Awake AEDs during EEG study: LEV Technical aspects: This EEG study was done with scalp electrodes positioned according to the 10-20 International system of electrode placement. Electrical activity was acquired at a sampling rate of '500Hz'$  and reviewed with a high frequency filter of '70Hz'$  and a low frequency filter of '1Hz'$ . EEG data were recorded continuously and digitally  stored. Description: The posterior dominant rhythm consists of 8-9 Hz activity of moderate voltage (25-35 uV) seen predominantly in posterior head regions, symmetric and  reactive to eye opening and eye closing. Hyperventilation and photic stimulation were not performed.   Of note, eeg was technically difficult due to significant eye flutter artifact. IMPRESSION: This study is within normal limits. No seizures or epileptiform discharges were seen throughout the recording. Lora Havens   ECHOCARDIOGRAM COMPLETE  Result Date: 05/08/2022    ECHOCARDIOGRAM REPORT   Patient Name:   BURKE TERRY Date of Exam: 05/08/2022 Medical Rec #:  [id number]       Height:       68.0 in Accession #:    [id number]      Weight:       175.0 lb Date of Birth:  1946/01/17        BSA:          1.931 m Patient Age:    36 years        BP:           146/86 mmHg Patient Gender: M               HR:           68 bpm. Exam Location:  [facility] Procedure: 2D Echo, Cardiac Doppler and Color Doppler Indications:    Syncope  History:        Patient has no prior history of Echocardiogram examinations.                 Arrythmias:Tachycardia, Signs/Symptoms:Syncope; Risk                 Factors:Hypertension and Dyslipidemia.  Sonographer:    Wenda Low Referring Phys: Haivana Nakya  1. Left ventricular ejection fraction, by estimation, is 60 to 65%. The left ventricle has normal function. The left ventricle has no regional wall motion abnormalities. There is mild left ventricular hypertrophy. Left ventricular diastolic parameters are consistent with Grade I diastolic dysfunction (impaired relaxation).  2. Right ventricular systolic function is normal. The right ventricular size is normal. There is normal pulmonary artery systolic pressure.  3. Left atrial size was mildly dilated.  4. The mitral valve is normal in structure. Mild mitral valve regurgitation.  5. The aortic valve is tricuspid. Aortic valve regurgitation  is not visualized. FINDINGS  Left Ventricle: Left ventricular ejection fraction, by estimation, is 60 to 65%. The left ventricle has normal function. The left ventricle has no regional wall motion abnormalities. The left ventricular internal cavity size was normal in size. There is  mild left ventricular hypertrophy. Left ventricular diastolic parameters are consistent with Grade I diastolic dysfunction (impaired relaxation). Right Ventricle: The right ventricular size is normal. Right vetricular wall thickness was not assessed. Right ventricular systolic function is normal. There is normal pulmonary artery systolic pressure. The tricuspid regurgitant velocity is 2.51 m/s, and with an assumed right atrial pressure of 3 mmHg, the estimated right ventricular systolic pressure is 67.6 mmHg. Left Atrium: Left atrial size was mildly dilated. Right Atrium: Right atrial size was normal in size. Pericardium: There is no evidence of pericardial effusion. Mitral Valve: The mitral valve is normal in structure. Mild mitral valve regurgitation. MV peak gradient, 3.1 mmHg. The mean mitral valve gradient is 1.0 mmHg. Tricuspid Valve: The tricuspid valve is normal in structure. Tricuspid valve regurgitation is trivial. Aortic Valve: The aortic valve is tricuspid. Aortic valve regurgitation is not visualized. Aortic valve mean gradient measures 5.0 mmHg. Aortic valve peak gradient measures 9.9 mmHg. Aortic valve area, by VTI measures 2.58 cm. Pulmonic Valve: The  pulmonic valve was normal in structure. Pulmonic valve regurgitation is not visualized. Aorta: The aortic root and ascending aorta are structurally normal, with no evidence of dilitation. IAS/Shunts: No atrial level shunt detected by color flow Doppler.  LEFT VENTRICLE PLAX 2D LVIDd:         4.30 cm     Diastology LVIDs:         2.80 cm     LV e' medial:    6.64 cm/s LV PW:         1.30 cm     LV E/e' medial:  13.0 LV IVS:        1.00 cm     LV e' lateral:   9.68 cm/s LVOT  diam:     2.00 cm     LV E/e' lateral: 8.9 LV SV:         89 LV SV Index:   46 LVOT Area:     3.14 cm  LV Volumes (MOD) LV vol d, MOD A2C: 70.1 ml LV vol d, MOD A4C: 88.9 ml LV vol s, MOD A2C: 24.3 ml LV vol s, MOD A4C: 30.7 ml LV SV MOD A2C:     45.8 ml LV SV MOD A4C:     88.9 ml LV SV MOD BP:      53.7 ml RIGHT VENTRICLE RV Basal diam:  3.55 cm RV Mid diam:    3.60 cm RV S prime:     11.60 cm/s TAPSE (M-mode): 2.1 cm LEFT ATRIUM             Index        RIGHT ATRIUM           Index LA diam:        3.80 cm 1.97 cm/m   RA Area:     19.10 cm LA Vol (A2C):   94.9 ml 49.15 ml/m  RA Volume:   56.60 ml  29.31 ml/m LA Vol (A4C):   53.8 ml 27.86 ml/m LA Biplane Vol: 71.7 ml 37.13 ml/m  AORTIC VALVE                     PULMONIC VALVE AV Area (Vmax):    2.80 cm      PV Vmax:       0.83 m/s AV Area (Vmean):   2.59 cm      PV Peak grad:  2.8 mmHg AV Area (VTI):     2.58 cm AV Vmax:           157.00 cm/s AV Vmean:          101.000 cm/s AV VTI:            0.346 m AV Peak Grad:      9.9 mmHg AV Mean Grad:      5.0 mmHg LVOT Vmax:         140.00 cm/s LVOT Vmean:        83.400 cm/s LVOT VTI:          0.284 m LVOT/AV VTI ratio: 0.82  AORTA Ao Root diam: 3.30 cm Ao Asc diam:  3.20 cm MITRAL VALVE               TRICUSPID VALVE MV Area (PHT): 2.82 cm    TR Peak grad:   25.2 mmHg MV Area VTI:   2.69 cm    TR Vmax:        251.00 cm/s MV Peak grad:  3.1  mmHg MV Mean grad:  1.0 mmHg    SHUNTS MV Vmax:       0.88 m/s    Systemic VTI:  0.28 m MV Vmean:      47.9 cm/s   Systemic Diam: 2.00 cm MV Decel Time: 269 msec MV E velocity: 86.10 cm/s MV A velocity: 84.40 cm/s MV E/A ratio:  1.02 Dorris Carnes MD Electronically signed by Dorris Carnes MD Signature Date/Time: 05/08/2022/4:10:06 PM    Final    MR BRAIN WO CONTRAST  Result Date: 05/08/2022 CLINICAL DATA:  New onset seizure EXAM: MRI HEAD WITHOUT CONTRAST TECHNIQUE: Multiplanar, multiecho pulse sequences of the brain and surrounding structures were obtained without intravenous  contrast. COMPARISON:  CT studies done yesterday FINDINGS: Brain: Diffusion imaging does not show any acute or subacute infarction or other cause of abnormal intracranial restricted diffusion. No focal abnormality affects the brainstem. No focal cerebellar insult. Cerebral hemispheres show mild to low moderate chronic small-vessel ischemic changes of the white matter, most prominent in the left frontal region. No evidence of large vessel territory infarction, mass lesion, hemorrhage, hydrocephalus or extra-axial collection. Mild ventricular prominence is in proportion to the degree of age related volume loss. Vascular: Major vessels at the base of the brain show flow. Skull and upper cervical spine: Scalp edema/hematoma on the left and at the vertex. Sinuses/Orbits: Clear/normal Other: None IMPRESSION: Left scalp edema/hematoma. No evidence of underlying skull fracture. No traumatic intracranial finding. Mild to low moderate chronic small-vessel ischemic changes of the cerebral hemispheric white matter, most prominent in the left frontal region. Age related brain volume loss. No finding to specifically explain seizure. Electronically Signed   By: Nelson Chimes M.D.   On: 05/08/2022 08:49   CT ANGIO HEAD NECK W WO CM  Result Date: 05/07/2022 CLINICAL DATA:  Head trauma, moderate-severe EXAM: CT ANGIOGRAPHY HEAD AND NECK TECHNIQUE: Multidetector CT imaging of the head and neck was performed using the standard protocol during bolus administration of intravenous contrast. Multiplanar CT image reconstructions and MIPs were obtained to evaluate the vascular anatomy. Carotid stenosis measurements (when applicable) are obtained utilizing NASCET criteria, using the distal internal carotid diameter as the denominator. RADIATION DOSE REDUCTION: This exam was performed according to the departmental dose-optimization program which includes automated exposure control, adjustment of the mA and/or kV according to patient size  and/or use of iterative reconstruction technique. CONTRAST:  73m OMNIPAQUE IOHEXOL 350 MG/ML SOLN COMPARISON:  Same day CT head. FINDINGS: CTA NECK FINDINGS Aortic arch: Great vessel origins are patent. Right carotid system: Atherosclerosis at the carotid bifurcation with approximately 40% stenosis of the ICA origin. No evidence of dissection. Left carotid system: Atherosclerosis at the carotid bifurcation without greater than 50% stenosis. No evidence of dissection. Vertebral arteries: Right dominant. Patent bilaterally without evidence of 50% or greater stenosis. No evidence of dissection. Skeleton: Multilevel degenerative change of the cervical spine with prominent bridging anterior osteophytes. Other neck: No acute abnormality. Upper chest: Visualized lung apices are clear. Review of the MIP images confirms the above findings CTA HEAD FINDINGS Anterior circulation: Bilateral intracranial ICAs, MCAs, and ACAs are patent without proximal hemodynamically significant stenosis. Mild bilateral intracranial ICA stenosis. No aneurysm identified. Posterior circulation: Bilateral intradural vertebral arteries, basilar artery and bilateral posterior cerebral arteries are patent without proximal hemodynamically significant stenosis. Venous sinuses: As permitted by contrast timing, patent. Review of the MIP images confirms the above findings IMPRESSION: 1. No emergent large vessel occlusion or intracranial proximal hemodynamically significant stenosis. 2. No evidence  of acute arterial injury. 3. 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 Electronically Signed   By: Margaretha Sheffield M.D.   On: 05/07/2022 15:14   CT HEAD WO CONTRAST  Result Date: 05/07/2022 CLINICAL DATA:  Head trauma, minor (Age >= 65y); Neck trauma (Age >= 65y) EXAM: CT HEAD WITHOUT CONTRAST CT CERVICAL SPINE WITHOUT CONTRAST TECHNIQUE: Multidetector CT imaging of the head and cervical spine  was performed following the standard protocol without intravenous contrast. Multiplanar CT image reconstructions of the cervical spine were also generated. RADIATION DOSE REDUCTION: This exam was performed according to the departmental dose-optimization program which includes automated exposure control, adjustment of the mA and/or kV according to patient size and/or use of iterative reconstruction technique. COMPARISON:  CT cervical spine August 25, 2015. FINDINGS: CT HEAD FINDINGS Brain: No evidence of acute infarction, hemorrhage, hydrocephalus, extra-axial collection or mass lesion/mass effect. Vascular: No hyperdense vessel identified. Calcific atherosclerosis. Skull: Left-sided scalp contusion without acute calvarial fracture. Sinuses/Orbits: Clear visualized sinuses. No acute orbital findings. Other: No mastoid effusions. CT CERVICAL SPINE FINDINGS Alignment: No substantial sagittal subluxation. Skull base and vertebrae: No evidence of acute fracture. Mild height loss of the T2 vertebral body appears similar to prior radiographs. Soft tissues and spinal canal: No prevertebral fluid or swelling. No visible canal hematoma. Disc levels: Bulky bridging anterior osteophytes throughout the cervical spine. Degenerative disc disease greatest in the lower cervical spine. Multilevel endplate spurring. Multilevel facet and uncovertebral hypertrophy with varying degrees of neural foraminal stenosis. Upper chest: Visualized lung apices are clear. IMPRESSION: CT head: 1. No evidence of acute intracranial abnormality. 2. Left-sided scalp contusion without acute calvarial fracture CT cervical spine: 1. No evidence of acute fracture or traumatic malalignment. 2. Multilevel degenerative change, detailed above. Electronically Signed   By: Margaretha Sheffield M.D.   On: 05/07/2022 11:04   CT CERVICAL SPINE WO CONTRAST  Result Date: 05/07/2022 CLINICAL DATA:  Head trauma, minor (Age >= 65y); Neck trauma (Age >= 65y) EXAM: CT  HEAD WITHOUT CONTRAST CT CERVICAL SPINE WITHOUT CONTRAST TECHNIQUE: Multidetector CT imaging of the head and cervical spine was performed following the standard protocol without intravenous contrast. Multiplanar CT image reconstructions of the cervical spine were also generated. RADIATION DOSE REDUCTION: This exam was performed according to the departmental dose-optimization program which includes automated exposure control, adjustment of the mA and/or kV according to patient size and/or use of iterative reconstruction technique. COMPARISON:  CT cervical spine August 25, 2015. FINDINGS: CT HEAD FINDINGS Brain: No evidence of acute infarction, hemorrhage, hydrocephalus, extra-axial collection or mass lesion/mass effect. Vascular: No hyperdense vessel identified. Calcific atherosclerosis. Skull: Left-sided scalp contusion without acute calvarial fracture. Sinuses/Orbits: Clear visualized sinuses. No acute orbital findings. Other: No mastoid effusions. CT CERVICAL SPINE FINDINGS Alignment: No substantial sagittal subluxation. Skull base and vertebrae: No evidence of acute fracture. Mild height loss of the T2 vertebral body appears similar to prior radiographs. Soft tissues and spinal canal: No prevertebral fluid or swelling. No visible canal hematoma. Disc levels: Bulky bridging anterior osteophytes throughout the cervical spine. Degenerative disc disease greatest in the lower cervical spine. Multilevel endplate spurring. Multilevel facet and uncovertebral hypertrophy with varying degrees of neural foraminal stenosis. Upper chest: Visualized lung apices are clear. IMPRESSION: CT head: 1. No evidence of acute intracranial abnormality. 2. Left-sided scalp contusion without acute calvarial fracture CT cervical spine: 1. No evidence of acute fracture or traumatic malalignment. 2. Multilevel degenerative change, detailed above. Electronically Signed   By:  Margaretha Sheffield M.D.   On: 05/07/2022 11:04   DG Chest 1  View  Result Date: 05/07/2022 CLINICAL DATA:  Fall.  Pain between the shoulder blades. EXAM: CHEST  1 VIEW COMPARISON:  10/18/2021 FINDINGS: 1028 hours. The lungs are clear without focal pneumonia, edema, pneumothorax or pleural effusion. The cardiopericardial silhouette is within normal limits for size. The visualized bony structures of the thorax are unremarkable. Telemetry leads overlie the chest. IMPRESSION: No active disease. Electronically Signed   By: Misty Stanley M.D.   On: 05/07/2022 11:02     Subjective: Patient reports that he feels well today.  He has had no further episodes.  He would like to go home.  Discharge Exam: Vitals:   05/08/22 0932 05/08/22 1411  BP: (!) 146/86 (!) 142/78  Pulse: 60 61  Resp: 18 18  Temp: 98.5 F (36.9 C) 98.6 F (37 C)  SpO2: 100% 100%   Vitals:   05/08/22 0859 05/08/22 0900 05/08/22 0932 05/08/22 1411  BP:  (!) 151/73 (!) 146/86 (!) 142/78  Pulse:  63 60 61  Resp: '16 15 18 18  '$ Temp: 98.9 F (37.2 C)  98.5 F (36.9 C) 98.6 F (37 C)  TempSrc: Oral   Oral  SpO2:  96% 100% 100%  Weight:      Height:        General: Pt is alert, awake, not in acute distress Cardiovascular: RRR, S1/S2 +, no rubs, no gallops Respiratory: CTA bilaterally, no wheezing, no rhonchi Abdominal: Soft, NT, ND, bowel sounds + Extremities: no edema, no cyanosis Neurological: nonfocal exam    The results of significant diagnostics from this hospitalization (including imaging, microbiology, ancillary and laboratory) are listed below for reference.     Microbiology: No results found for this or any previous visit (from the past 240 hour(s)).   Labs: BNP (last 3 results) No results for input(s): "BNP" in the last 8760 hours. Basic Metabolic Panel: Recent Labs  Lab 05/07/22 1115 05/08/22 0333  NA 138 138  K 4.4 3.6  CL 103 107  CO2 26 25  GLUCOSE 137* 107*  BUN 12 10  CREATININE 0.95 1.02  CALCIUM 8.9 8.5*  MG 2.2 2.4   Liver Function Tests: Recent  Labs  Lab 05/07/22 1115  AST 22  ALT 16  ALKPHOS 97  BILITOT 0.5  PROT 7.6  ALBUMIN 4.3   No results for input(s): "LIPASE", "AMYLASE" in the last 168 hours. No results for input(s): "AMMONIA" in the last 168 hours. CBC: Recent Labs  Lab 05/07/22 1115  WBC 9.8  NEUTROABS 8.3*  HGB 15.2  HCT 45.3  MCV 89.9  PLT 166   Cardiac Enzymes: No results for input(s): "CKTOTAL", "CKMB", "CKMBINDEX", "TROPONINI" in the last 168 hours. BNP: Invalid input(s): "POCBNP" CBG: Recent Labs  Lab 05/07/22 1158  GLUCAP 147*   D-Dimer No results for input(s): "DDIMER" in the last 72 hours. Hgb A1c Recent Labs    05/07/22 1115  HGBA1C 5.8*   Lipid Profile Recent Labs    05/08/22 0333  CHOL 146  HDL 46  LDLCALC 61  TRIG 197*  CHOLHDL 3.2   Thyroid function studies Recent Labs    05/07/22 1115  TSH 3.726   Anemia work up No results for input(s): "VITAMINB12", "FOLATE", "FERRITIN", "TIBC", "IRON", "RETICCTPCT" in the last 72 hours. Urinalysis    Component Value Date/Time   COLORURINE YELLOW 05/07/2022 1130   APPEARANCEUR CLEAR 05/07/2022 1130   LABSPEC 1.012 05/07/2022 1130  PHURINE 6.0 05/07/2022 1130   GLUCOSEU NEGATIVE 05/07/2022 1130   HGBUR NEGATIVE 05/07/2022 1130   BILIRUBINUR NEGATIVE 05/07/2022 1130   BILIRUBINUR NEG 02/19/2014 1620   KETONESUR NEGATIVE 05/07/2022 1130   PROTEINUR NEGATIVE 05/07/2022 1130   UROBILINOGEN negative 02/19/2014 1620   NITRITE NEGATIVE 05/07/2022 1130   LEUKOCYTESUR NEGATIVE 05/07/2022 1130   Sepsis Labs Recent Labs  Lab 05/07/22 1115  WBC 9.8   Microbiology No results found for this or any previous visit (from the past 240 hour(s)).  Time coordinating discharge:   SIGNED:  Irwin Brakeman, MD  [facility] 05/08/2022, 6:00 PM How to contact the [facility] Attending or Consulting provider [place] or covering provider during after hours [place], for this patient?  Check the care team in [facility] and look for a)  attending/consulting [facility] provider listed and b) the [facility] team listed Log into [email or web address] and use [place]'s universal password to access. If you do not have the password, please contact the hospital operator. Locate the [facility] provider you are looking for under [facility] and page to a number that you can be directly reached. If you still have difficulty reaching the provider, please page the [facility] (Director on Call) for the Hospitalists listed on amion for assistance.

## 2022-05-08 NOTE — Procedures (Signed)
Patient Name: Jonathan Love  MRN: [id number]  Epilepsy Attending: Lora Havens  Referring Physician/Provider: Murlean Iba, MD  Date: 05/08/2022 Duration: 22.38 mins  Patient history: 76 year old male with no prior history of epilepsy who presented with an episode of loss of consciousness and then had witnessed generalized tonic-clonic seizure while in the emergency room. EEG to evaluate for seizure  Level of alertness: Awake  AEDs during EEG study: LEV  Technical aspects: This EEG study was done with scalp electrodes positioned according to the 10-20 International system of electrode placement. Electrical activity was acquired at a sampling rate of '500Hz'$  and reviewed with a high frequency filter of '70Hz'$  and a low frequency filter of '1Hz'$ . EEG data were recorded continuously and digitally stored.   Description: The posterior dominant rhythm consists of 8-9 Hz activity of moderate voltage (25-35 uV) seen predominantly in posterior head regions, symmetric and reactive to eye opening and eye closing. Hyperventilation and photic stimulation were not performed.     Of note, eeg was technically difficult due to significant eye flutter artifact.   IMPRESSION: This study is within normal limits. No seizures or epileptiform discharges were seen throughout the recording.  Mireille Lacombe Barbra Sarks

## 2022-05-08 NOTE — Care Management CC44 (Signed)
Condition Code 44 Documentation Completed  Patient Details  Name: Jonathan Love MRN: [id number] Date of Birth: 1946-08-27   Condition Code 44 given:  Yes Patient signature on Condition Code 44 notice:  Yes Documentation of 2 MD's agreement:  Yes Code 44 added to claim:  Yes    Roseanne Kaufman, RN 05/08/2022, 6:10 PM

## 2022-05-08 NOTE — Plan of Care (Signed)
  Problem: Acute Rehab PT Goals(only PT should resolve) Goal: Pt Will Go Supine/Side To Sit Outcome: Progressing Flowsheets (Taken 05/08/2022 1543) Pt will go Supine/Side to Sit: Independently Goal: Patient Will Transfer Sit To/From Stand Outcome: Progressing Flowsheets (Taken 05/08/2022 1543) Patient will transfer sit to/from stand:  with modified independence  Independently Goal: Pt Will Transfer Bed To Chair/Chair To Bed Outcome: Progressing Flowsheets (Taken 05/08/2022 1543) Pt will Transfer Bed to Chair/Chair to Bed:  Independently  with modified independence Goal: Pt Will Perform Standing Balance Or Pre-Gait Outcome: Progressing Flowsheets (Taken 05/08/2022 1543) Pt will perform standing balance or pre-gait:  Independently  with Modified Independent  with unilateral UE support  3- 5 min Goal: Pt Will Ambulate Outcome: Progressing Flowsheets (Taken 05/08/2022 1543) Pt will Ambulate:  > 125 feet  with supervision  with modified independence  with cane   3:43 PM, 05/08/22 Lestine Box, S/PT

## 2022-05-09 ENCOUNTER — Telehealth: Payer: Self-pay

## 2022-05-09 NOTE — Telephone Encounter (Signed)
Transition Care Management Follow-up Telephone Call Date of discharge and from where: 05/08/22 - [facility] - new onset seizure How have you been since you were released from the hospital? He is doing better, just feels tired Any questions or concerns? No  Items Reviewed: Did the pt receive and understand the discharge instructions provided? Yes  Medications obtained and verified? Yes  Other? No  Any new allergies since your discharge? No  Dietary orders reviewed? Yes Do you have support at home? Yes   Home Care and Equipment/Supplies: Were home health services ordered? No  Were any new equipment or medical supplies ordered?  No   Functional Questionnaire: (I = Independent and D = Dependent) ADLs: I  Bathing/Dressing- I  Meal Prep- I  Eating- I  Maintaining continence- I  Transferring/Ambulation- I  Managing Meds- I  Follow up appointments reviewed:  PCP Hospital f/u appt confirmed? Yes  Scheduled to see [facility] on 05/18/22 @ 2. [facility] f/u appt confirmed?  Waiting for Neurology to call back for appt for August   Are transportation arrangements needed? No  If their condition worsens, is the pt aware to call PCP or go to the Emergency Dept.? Yes Was the patient provided with contact information for the PCP's office or ED? Yes Was to pt encouraged to call back with questions or concerns? Yes

## 2022-05-16 ENCOUNTER — Encounter: Payer: Self-pay | Admitting: Neurology

## 2022-05-16 ENCOUNTER — Ambulatory Visit: Payer: Medicare Other | Admitting: Neurology

## 2022-05-16 VITALS — BP 145/79 | HR 76 | Ht 68.0 in | Wt 176.5 lb

## 2022-05-16 DIAGNOSIS — Z5181 Encounter for therapeutic drug level monitoring: Secondary | ICD-10-CM | POA: Diagnosis not present

## 2022-05-16 DIAGNOSIS — G40909 Epilepsy, unspecified, not intractable, without status epilepticus: Secondary | ICD-10-CM | POA: Diagnosis not present

## 2022-05-16 MED ORDER — PREGABALIN 75 MG PO CAPS: 75.0000 mg | ORAL_CAPSULE | Freq: Two times a day (BID) | ORAL | 2 refills | Status: DC

## 2022-05-16 MED ORDER — LEVETIRACETAM 500 MG PO TABS: 500.0000 mg | ORAL_TABLET | Freq: Two times a day (BID) | ORAL | 3 refills | Status: DC

## 2022-05-16 NOTE — Patient Instructions (Signed)
Continue with Keppra 500 mg BID  Will check a Keppra level today  Discontinue Gabapentin  Start Pregabalin at 75 mg BID, will increase as tolerate to control the pain  Follow up in 6 months or sooner if worse  Discuss driving restriction for the next 6 months

## 2022-05-16 NOTE — Progress Notes (Signed)
[facility]  PATIENT: Jonathan Love DOB: [birth year]  REQUESTING CLINICIAN: Murlean Iba, MD HISTORY FROM: Patient and spouse  REASON FOR VISIT: New onset epilepsy    HISTORICAL  CHIEF COMPLAINT:  Chief Complaint  Patient presents with   New Patient (Initial Visit)    Rm 12. Accompanied by wife. NP ED referral for new onset seizure.    HISTORY OF PRESENT ILLNESS:  This is a 76 gentleman with PMHx of heart disease, hypertension, hyperlipidemia and peripheral neuropathy who is presenting after new onset of seizure. Wife reports on July 9, she found patient in the bathroom unresponsive after hearing a thud and a faint crying noise. EMS was called and patient was rushed in the hospital. While in the ED, he had a second generalized convulsion requiring Ativan and Keppra load. His MRI brain was negative for acute abnormalities and his EEG was within normal limits. This was the patient first lifetime seizure and after discussion, plan was to start Keppra 500 mg BID. Since started the medication, he denies any side effects other than he is sleeping better at night.  For his peripheral neuropathy, his gabapentin was increased to 300 mg TID but reports no improvement. He reports trying Pregabalin in the past which some improvement of the pain.   Handedness: Right handed   Onset: 05/07/22  Seizure Type: Generalized convulsion   Current frequency: Only once   Any injuries from seizures: Bruises   Seizure risk factors: None reported   Previous ASMs: None   Currenty ASMs: Levetiracetam 500 mg BID   ASMs side effects: none   Brain Images: normal Brain MRI   Previous EEGs: Normal EEG    OTHER MEDICAL CONDITIONS: Hypertension, hyperlipidemia and heart disease, peripheral neuropathy  REVIEW OF SYSTEMS: Full 14 system review of systems performed and negative with exception of: as noted in the HPI   ALLERGIES: Allergies  Allergen Reactions   Penicillins  Other (See Comments)    Seizures    HOME MEDICATIONS: Outpatient Medications Prior to Visit  Medication Sig Dispense Refill   allopurinol (ZYLOPRIM) 300 MG tablet Take 1 tablet (300 mg total) by mouth daily. 90 tablet 3   atorvastatin (LIPITOR) 40 MG tablet Take 1 tablet (40 mg total) by mouth daily. 90 tablet 3   benazepril (LOTENSIN) 40 MG tablet TAKE 1 TABLET BY MOUTH  DAILY (Patient taking differently: Take 40 mg by mouth daily.) 100 tablet 0   busPIRone (BUSPAR) 15 MG tablet Take 1 tablet (15 mg total) by mouth 2 (two) times daily. 60 tablet 2   diltiazem (CARDIZEM CD) 180 MG 24 hr capsule TAKE 1 CAPSULE BY MOUTH DAILY (Patient taking differently: Take 180 mg by mouth daily.) 90 capsule 0   fluticasone (FLONASE) 50 MCG/ACT nasal spray Place 2 sprays into both nostrils at bedtime as needed for allergies or rhinitis. 16 g 6   nabumetone (RELAFEN) 500 MG tablet TAKE 1 TABLET BY MOUTH DAILY (Patient taking differently: Take 500 mg by mouth daily.) 90 tablet 0   omeprazole (PRILOSEC) 40 MG capsule Take 1 capsule (40 mg total) by mouth daily. 90 capsule 3   PARoxetine (PAXIL) 20 MG tablet Take 2 tablets (40 mg total) by mouth daily. 180 tablet 3   tamsulosin (FLOMAX) 0.4 MG CAPS capsule Take 1 capsule (0.4 mg total) by mouth daily. 90 capsule 3   traZODone (DESYREL) 50 MG tablet Take 0.5-1 tablets (25-50 mg total) by mouth at bedtime as needed for sleep. 30 tablet  3   gabapentin (NEURONTIN) 300 MG capsule Take 1 capsule (300 mg total) by mouth 3 (three) times daily. 90 capsule 0   levETIRAcetam (KEPPRA) 500 MG tablet Take 1 tablet (500 mg total) by mouth 2 (two) times daily. 60 tablet 1   Coenzyme Q10-Vitamin E (QUNOL ULTRA COQ10 PO) Take 1 capsule by mouth at bedtime.     metoprolol succinate (TOPROL-XL) 50 MG 24 hr tablet Take 50 mg by mouth daily. Take with or immediately following a meal.     No facility-administered medications prior to visit.    PAST MEDICAL HISTORY: Past Medical  History:  Diagnosis Date   Anxiety    Cataract    CTS (carpal tunnel syndrome)    left   Diverticulitis    GERD (gastroesophageal reflux disease)    Gout    H/O agent Orange exposure    Hyperlipidemia    Hypertension    Melanoma ([place]) 04/2020   right cheek   Migraine    Neuropathy    OAB (overactive bladder)    Prediabetes     PAST SURGICAL HISTORY: Past Surgical History:  Procedure Laterality Date   EYE SURGERY Left 08/24/2016   retinal - dr Rodman Key = laser eye   MELANOMA EXCISION Right    cheek   right lateral epicondylitis     vocal cord nodule      FAMILY HISTORY: Family History  Problem Relation Age of Onset   Stomach cancer Father    Lung cancer Father    Heart disease Mother     SOCIAL HISTORY: Social History   Socioeconomic History   Marital status: Married    Spouse name: Not on file   Number of children: Not on file   Years of education: Not on file   Highest education level: Not on file  Occupational History   Not on file  Tobacco Use   Smoking status: Never   Smokeless tobacco: Never  Vaping Use   Vaping Use: Never used  Substance and Sexual Activity   Alcohol use: No   Drug use: No   Sexual activity: Not on file  Other Topics Concern   Not on file  Social History Narrative   Not on file   Social Determinants of Health   Financial Resource Strain: Not on file  Food Insecurity: Not on file  Transportation Needs: Not on file  Physical Activity: Not on file  Stress: Not on file  Social Connections: Not on file  Intimate Partner Violence: Not on file    PHYSICAL EXAM  GENERAL EXAM/CONSTITUTIONAL: Vitals:  Vitals:   05/16/22 1333  BP: (!) 145/79  Pulse: 76  Weight: 176 lb 8 oz (80.1 kg)  Height: '5\' 8"'$  (1.727 m)   Body mass index is 26.84 kg/m. Wt Readings from Last 3 Encounters:  05/16/22 176 lb 8 oz (80.1 kg)  05/07/22 175 lb (79.4 kg)  03/21/22 180 lb (81.6 kg)   Patient is in no distress; well developed, nourished  and groomed; neck is supple  EYES: Pupils round and reactive to light, Visual fields full to confrontation, Extraocular movements intacts,  No results found.  MUSCULOSKELETAL: Gait, strength, tone, movements noted in Neurologic exam below  NEUROLOGIC: MENTAL STATUS:      No data to display         awake, alert, oriented to person, place and time recent and remote memory intact normal attention and concentration language fluent, comprehension intact, naming intact fund of knowledge appropriate  CRANIAL NERVE:  2nd, 3rd, 4th, 6th - pupils equal and reactive to light, visual fields full to confrontation, extraocular muscles intact, no nystagmus 5th - facial sensation symmetric 7th - facial strength symmetric 8th - hearing intact 9th - palate elevates symmetrically, uvula midline 11th - shoulder shrug symmetric 12th - tongue protrusion midline  MOTOR:  normal bulk and tone, full strength in the BUE, BLE  SENSORY:  normal and symmetric to light touch, pinprick, temperature, vibration  COORDINATION:  finger-nose-finger, fine finger movements normal  REFLEXES:  deep tendon reflexes present and symmetric  GAIT/STATION:  Ambulates with a cane     DIAGNOSTIC DATA (LABS, IMAGING, TESTING) - I reviewed patient records, labs, notes, testing and imaging myself where available.  Lab Results  Component Value Date   WBC 9.8 05/07/2022   HGB 15.2 05/07/2022   HCT 45.3 05/07/2022   MCV 89.9 05/07/2022   PLT 166 05/07/2022      Component Value Date/Time   NA 138 05/08/2022 0333   NA 142 02/16/2022 1419   K 3.6 05/08/2022 0333   CL 107 05/08/2022 0333   CO2 25 05/08/2022 0333   GLUCOSE 107 (H) 05/08/2022 0333   BUN 10 05/08/2022 0333   BUN 12 02/16/2022 1419   CREATININE 1.02 05/08/2022 0333   CREATININE 1.21 04/09/2013 1308   CALCIUM 8.5 (L) 05/08/2022 0333   PROT 7.6 05/07/2022 1115   PROT 7.0 02/16/2022 1419   ALBUMIN 4.3 05/07/2022 1115   ALBUMIN 4.7  02/16/2022 1419   AST 22 05/07/2022 1115   ALT 16 05/07/2022 1115   ALKPHOS 97 05/07/2022 1115   BILITOT 0.5 05/07/2022 1115   BILITOT 0.5 02/16/2022 1419   GFRNONAA >60 05/08/2022 0333   GFRNONAA 62 04/09/2013 1308   GFRAA 83 07/09/2020 1349   GFRAA 72 04/09/2013 1308   Lab Results  Component Value Date   CHOL 146 05/08/2022   HDL 46 05/08/2022   LDLCALC 61 05/08/2022   TRIG 197 (H) 05/08/2022   Lab Results  Component Value Date   HGBA1C 5.8 (H) 05/07/2022   No results found for: "VITAMINB12" Lab Results  Component Value Date   TSH 3.726 05/07/2022    MRI Brain 05/08/22 Left scalp edema/hematoma. No evidence of underlying skull fracture. No traumatic intracranial finding. Mild to low moderate chronic small-vessel ischemic changes of the cerebral hemispheric white matter, most prominent in the left frontal region. Age related brain volume loss. No finding to specifically explain seizure  EEG 05/08/22 This study is within normal limits. No seizures or epileptiform discharges were seen throughout the recording    ASSESSMENT AND PLAN  76 y.o. year old male  with with history of hypertension, hyperlipidemia, cad and peripheral neuropathy who is presenting after his first lifetime seizure. Per history, patient had 2 seizures within 24 hrs but after discussion with family, plan was to start anti seizure medication, Keppra. Since then, he has been doing well, denies any additional seizures, no side effects, tolerating the medication well. At this point, we will check a Keppra level and continue patient on the same dose.  For his peripheral neuropathy, he reports that gabapentin is no longer helpful even though he is taking 300 mg TID, we will switch him to Pregabalin since it was beneficial for patient in the past.  Continue to follow up with PCP and return in 6 months. I have asked patient to update me if there are any changes.    1. Seizure disorder ([place])  2. Therapeutic drug  monitoring     Patient Instructions  Continue with Keppra 500 mg BID  Will check a Keppra level today  Discontinue Gabapentin  Start Pregabalin at 75 mg BID, will increase as tolerate to control the pain  Follow up in 6 months or sooner if worse  Discuss driving restriction for the next 6 months    Per [facility] statutes, patients with seizures are not allowed to drive until they have been seizure-free for six months.  Other recommendations include using caution when using heavy equipment or power tools. Avoid working on ladders or at heights. Take showers instead of baths.  Do not swim alone.  Ensure the water temperature is not too high on the home water heater. Do not go swimming alone. Do not lock yourself in a room alone (i.e. bathroom). When caring for infants or small children, sit down when holding, feeding, or changing them to minimize risk of injury to the child in the event you have a seizure. Maintain good sleep hygiene. Avoid alcohol.  Also recommend adequate sleep, hydration, good diet and minimize stress.   During the Seizure  - First, ensure adequate ventilation and place patients on the floor on their left side  Loosen clothing around the neck and ensure the airway is patent. If the patient is clenching the teeth, do not force the mouth open with any object as this can cause severe damage - Remove all items from the surrounding that can be hazardous. The patient may be oblivious to what's happening and may not even know what he or she is doing. If the patient is confused and wandering, either gently guide him/her away and block access to outside areas - Reassure the individual and be comforting - Call 911. In most cases, the seizure ends before EMS arrives. However, there are cases when seizures may last over 3 to 5 minutes. Or the individual may have developed breathing difficulties or severe injuries. If a pregnant patient or a person with diabetes develops a  seizure, it is prudent to call an ambulance. - Finally, if the patient does not regain full consciousness, then call EMS. Most patients will remain confused for about 45 to 90 minutes after a seizure, so you must use judgment in calling for help. - Avoid restraints but make sure the patient is in a bed with padded side rails - Place the individual in a lateral position with the neck slightly flexed; this will help the saliva drain from the mouth and prevent the tongue from falling backward - Remove all nearby furniture and other hazards from the area - Provide verbal assurance as the individual is regaining consciousness - Provide the patient with privacy if possible - Call for help and start treatment as ordered by the caregiver   After the Seizure (Postictal Stage)  After a seizure, most patients experience confusion, fatigue, muscle pain and/or a headache. Thus, one should permit the individual to sleep. For the next few days, reassurance is essential. Being calm and helping reorient the person is also of importance.  Most seizures are painless and end spontaneously. Seizures are not harmful to others but can lead to complications such as stress on the lungs, brain and the heart. Individuals with prior lung problems may develop labored breathing and respiratory distress.     Orders Placed This Encounter  Procedures   Levetiracetam level    Meds ordered this encounter  Medications   pregabalin (LYRICA) 75 MG capsule  Sig: Take 1 capsule (75 mg total) by mouth 2 (two) times daily.    Dispense:  60 capsule    Refill:  2   levETIRAcetam (KEPPRA) 500 MG tablet    Sig: Take 1 tablet (500 mg total) by mouth 2 (two) times daily.    Dispense:  180 tablet    Refill:  3    Return in about 6 months (around 11/16/2022).  I have spent a total of 47 minutes dedicated to this patient today, preparing to see patient, performing a medically appropriate examination and evaluation, ordering tests  and/or medications and procedures, and counseling and educating the patient/family/caregiver; independently interpreting result and communicating results to the family/patient/caregiver; and documenting clinical information in the electronic medical record.   Alric Ran, MD 05/16/2022, 2:20 PM  [facility] [address], [place], [place] [address] [phone]

## 2022-05-18 ENCOUNTER — Ambulatory Visit (INDEPENDENT_AMBULATORY_CARE_PROVIDER_SITE_OTHER): Payer: Medicare Other | Admitting: Family Medicine

## 2022-05-18 ENCOUNTER — Encounter: Payer: Self-pay | Admitting: Family Medicine

## 2022-05-18 VITALS — BP 133/79 | HR 78 | Temp 97.0°F | Ht 68.0 in | Wt 178.0 lb

## 2022-05-18 DIAGNOSIS — R7303 Prediabetes: Secondary | ICD-10-CM

## 2022-05-18 DIAGNOSIS — W19XXXD Unspecified fall, subsequent encounter: Secondary | ICD-10-CM | POA: Diagnosis not present

## 2022-05-18 DIAGNOSIS — E7800 Pure hypercholesterolemia, unspecified: Secondary | ICD-10-CM | POA: Diagnosis not present

## 2022-05-18 DIAGNOSIS — I10 Essential (primary) hypertension: Secondary | ICD-10-CM | POA: Diagnosis not present

## 2022-05-18 DIAGNOSIS — R569 Unspecified convulsions: Secondary | ICD-10-CM

## 2022-05-18 LAB — BAYER DCA HB A1C WAIVED: HB A1C (BAYER DCA - WAIVED): 5.5 % (ref 4.8–5.6)

## 2022-05-18 LAB — LEVETIRACETAM LEVEL: Levetiracetam Lvl: 10.4 ug/mL (ref 10.0–40.0)

## 2022-05-18 NOTE — Progress Notes (Signed)
BP 133/79   Pulse 78   Temp (!) 97 F (36.1 C)   Ht '5\' 8"'  (1.727 m)   Wt 178 lb (80.7 kg)   SpO2 97%   BMI 27.06 kg/m    Subjective:   Patient ID: Jonathan Love, male    DOB: [birth year], 76 y.o.   MRN: [id number]  HPI: Jonathan Love is a 76 y.o. male presenting on 05/18/2022 for Hospitalization Follow-up and Seizures   HPI Transition Care Management Follow-up Telephone Call Date of discharge and from where: 05/08/22 - [facility] - new onset seizure How have you been since you were released from the hospital? He is doing better, just feels tired Any questions or concerns? No Patient contacted by Adalberto Cole LPN on 8/32/9191  Transition of care office visit Patient coming in for transitional care office visit.  Patient was seen in the hospital on 05/07/2022 and discharged 05/08/2022 for new onset seizures.  Since leaving the hospital he has seen neurology on 05/16/2022.  Patient was started on Keppra and changed from gabapentin to pregabalin.  Patient had a fall in his home and his wife heard something and came down and found him on the ground and he was not conscious, she called 911 and did do some CPR as instructed by 911 and then the emergency services showed up.  When he was taken to the hospital and then after the hospital he did have a witnessed seizure at the hospital and saw neurology and has seen neurology since he left the hospital.  They are suspicious that he might of fallen and hit his head because the seizure rather than him having a seizure at home that caused the initial fall.  They are going to follow-up with him with neurology.  They did start him on Keppra and made an adjustment to Lyrica from gabapentin.  Since leaving the hospital he said he did have some headaches and some shoulder aches initially from where he fell but those have gone and he has not had any other seizure-like disorder.  He says he is feeling better since leaving the hospital.  Relevant past medical,  surgical, family and social history reviewed and updated as indicated. Interim medical history since our last visit reviewed. Allergies and medications reviewed and updated.  Review of Systems  Constitutional:  Negative for chills and fever.  Eyes:  Negative for visual disturbance.  Respiratory:  Negative for shortness of breath and wheezing.   Cardiovascular:  Negative for chest pain and leg swelling.  Musculoskeletal:  Negative for back pain and gait problem.  Skin:  Negative for rash.  Neurological:  Positive for seizures and headaches. Negative for weakness.  All other systems reviewed and are negative.   Per HPI unless specifically indicated above   Allergies as of 05/18/2022       Reactions   Penicillins Other (See Comments)   Seizures        Medication List        Accurate as of May 18, 2022  2:43 PM. If you have any questions, ask your nurse or doctor.          allopurinol 300 MG tablet Commonly known as: ZYLOPRIM Take 1 tablet (300 mg total) by mouth daily.   atorvastatin 40 MG tablet Commonly known as: LIPITOR Take 1 tablet (40 mg total) by mouth daily.   benazepril 40 MG tablet Commonly known as: LOTENSIN TAKE 1 TABLET BY MOUTH  DAILY   busPIRone 15  MG tablet Commonly known as: BUSPAR Take 1 tablet (15 mg total) by mouth 2 (two) times daily.   diltiazem 180 MG 24 hr capsule Commonly known as: CARDIZEM CD TAKE 1 CAPSULE BY MOUTH DAILY   fluticasone 50 MCG/ACT nasal spray Commonly known as: FLONASE Place 2 sprays into both nostrils at bedtime as needed for allergies or rhinitis.   levETIRAcetam 500 MG tablet Commonly known as: KEPPRA Take 1 tablet (500 mg total) by mouth 2 (two) times daily.   nabumetone 500 MG tablet Commonly known as: RELAFEN TAKE 1 TABLET BY MOUTH DAILY   omeprazole 40 MG capsule Commonly known as: PRILOSEC Take 1 capsule (40 mg total) by mouth daily.   PARoxetine 20 MG tablet Commonly known as: PAXIL Take 2  tablets (40 mg total) by mouth daily.   pregabalin 75 MG capsule Commonly known as: Lyrica Take 1 capsule (75 mg total) by mouth 2 (two) times daily.   tamsulosin 0.4 MG Caps capsule Commonly known as: FLOMAX Take 1 capsule (0.4 mg total) by mouth daily.   traZODone 50 MG tablet Commonly known as: DESYREL Take 0.5-1 tablets (25-50 mg total) by mouth at bedtime as needed for sleep.         Objective:   BP 133/79   Pulse 78   Temp (!) 97 F (36.1 C)   Ht '5\' 8"'  (1.727 m)   Wt 178 lb (80.7 kg)   SpO2 97%   BMI 27.06 kg/m   Wt Readings from Last 3 Encounters:  05/18/22 178 lb (80.7 kg)  05/16/22 176 lb 8 oz (80.1 kg)  05/07/22 175 lb (79.4 kg)    Physical Exam Vitals and nursing note reviewed.  Constitutional:      General: He is not in acute distress.    Appearance: He is well-developed. He is not diaphoretic.  Eyes:     General: No scleral icterus.    Conjunctiva/sclera: Conjunctivae normal.  Neck:     Thyroid: No thyromegaly.  Cardiovascular:     Rate and Rhythm: Normal rate and regular rhythm.     Heart sounds: Normal heart sounds. No murmur heard. Pulmonary:     Effort: Pulmonary effort is normal. No respiratory distress.     Breath sounds: Normal breath sounds. No wheezing.  Musculoskeletal:        General: No swelling. Normal range of motion.     Cervical back: Neck supple.  Lymphadenopathy:     Cervical: No cervical adenopathy.  Skin:    General: Skin is warm and dry.     Findings: No rash.  Neurological:     Mental Status: He is alert and oriented to person, place, and time.     Coordination: Coordination normal.  Psychiatric:        Behavior: Behavior normal.       Assessment & Plan:   Problem List Items Addressed This Visit       Cardiovascular and Mediastinum   HTN (hypertension) (Chronic)   Relevant Orders   Bayer DCA Hb A1c Waived   CBC with Differential/Platelet   CMP14+EGFR     Other   HLD (hyperlipidemia) (Chronic)    Relevant Orders   Bayer DCA Hb A1c Waived   CBC with Differential/Platelet   Lipid panel   Prediabetes (Chronic)   Relevant Orders   Bayer DCA Hb A1c Waived   Lipid panel   New onset seizure ([place])   Relevant Orders   CBC with Differential/Platelet   CMP14+EGFR  Lipid panel   Other Visit Diagnoses     Fall, subsequent encounter    -  Primary   Relevant Orders   CBC with Differential/Platelet   CMP14+EGFR   Lipid panel       We will check his usual 76-monthblood work today.  Continue follow-up with neurology. Follow up plan: Return in about 3 months (around 08/18/2022), or if symptoms worsen or fail to improve, for Hypertension and hyperlipidemia and prediabetes.  Counseling provided for all of the vaccine components Orders Placed This Encounter  Procedures   Bayer DJupiter FarmsHb A1c Waived   CBC with Differential/Platelet   CMP14+EGFR   Lipid panel    JCaryl Pina MD [facility] 05/18/2022, 2:43 PM

## 2022-05-19 ENCOUNTER — Other Ambulatory Visit: Payer: Self-pay | Admitting: Nurse Practitioner

## 2022-05-19 ENCOUNTER — Ambulatory Visit: Payer: Medicare Other | Admitting: Family Medicine

## 2022-05-19 LAB — CBC WITH DIFFERENTIAL/PLATELET
Basophils Absolute: 0.1 10*3/uL (ref 0.0–0.2)
Basos: 1 %
EOS (ABSOLUTE): 0.4 10*3/uL (ref 0.0–0.4)
Eos: 5 %
Hematocrit: 42.6 % (ref 37.5–51.0)
Hemoglobin: 14.2 g/dL (ref 13.0–17.7)
Immature Grans (Abs): 0 10*3/uL (ref 0.0–0.1)
Immature Granulocytes: 1 %
Lymphocytes Absolute: 1.4 10*3/uL (ref 0.7–3.1)
Lymphs: 18 %
MCH: 29.9 pg (ref 26.6–33.0)
MCHC: 33.3 g/dL (ref 31.5–35.7)
MCV: 90 fL (ref 79–97)
Monocytes Absolute: 0.6 10*3/uL (ref 0.1–0.9)
Monocytes: 7 %
Neutrophils Absolute: 5.4 10*3/uL (ref 1.4–7.0)
Neutrophils: 68 %
Platelets: 238 10*3/uL (ref 150–450)
RBC: 4.75 x10E6/uL (ref 4.14–5.80)
RDW: 13.9 % (ref 11.6–15.4)
WBC: 7.9 10*3/uL (ref 3.4–10.8)

## 2022-05-19 LAB — LIPID PANEL
Chol/HDL Ratio: 3.4 ratio (ref 0.0–5.0)
Cholesterol, Total: 169 mg/dL (ref 100–199)
HDL: 50 mg/dL (ref >39)
LDL Chol Calc (NIH): 82 mg/dL (ref 0–99)
Triglycerides: 221 mg/dL — ABNORMAL HIGH (ref 0–149)
VLDL Cholesterol Cal: 37 mg/dL (ref 5–40)

## 2022-05-19 LAB — CMP14+EGFR
ALT: 26 IU/L (ref 0–44)
AST: 15 IU/L (ref 0–40)
Albumin/Globulin Ratio: 2 (ref 1.2–2.2)
Albumin: 4.5 g/dL (ref 3.8–4.8)
Alkaline Phosphatase: 168 IU/L — ABNORMAL HIGH (ref 44–121)
BUN/Creatinine Ratio: 11 (ref 10–24)
BUN: 11 mg/dL (ref 8–27)
Bilirubin Total: 0.4 mg/dL (ref 0.0–1.2)
CO2: 27 mmol/L (ref 20–29)
Calcium: 9.7 mg/dL (ref 8.6–10.2)
Chloride: 102 mmol/L (ref 96–106)
Creatinine, Ser: 1.03 mg/dL (ref 0.76–1.27)
Globulin, Total: 2.2 g/dL (ref 1.5–4.5)
Glucose: 109 mg/dL — ABNORMAL HIGH (ref 70–99)
Potassium: 5.3 mmol/L — ABNORMAL HIGH (ref 3.5–5.2)
Sodium: 143 mmol/L (ref 134–144)
Total Protein: 6.7 g/dL (ref 6.0–8.5)
eGFR: 76 mL/min/{1.73_m2} (ref >59)

## 2022-05-30 ENCOUNTER — Other Ambulatory Visit: Payer: Self-pay | Admitting: Family Medicine

## 2022-05-30 DIAGNOSIS — I10 Essential (primary) hypertension: Secondary | ICD-10-CM

## 2022-06-15 DIAGNOSIS — S0101XA Laceration without foreign body of scalp, initial encounter: Secondary | ICD-10-CM | POA: Diagnosis not present

## 2022-06-15 DIAGNOSIS — Z23 Encounter for immunization: Secondary | ICD-10-CM | POA: Diagnosis not present

## 2022-07-10 ENCOUNTER — Other Ambulatory Visit: Payer: Self-pay | Admitting: Family Medicine

## 2022-07-10 DIAGNOSIS — K219 Gastro-esophageal reflux disease without esophagitis: Secondary | ICD-10-CM

## 2022-07-25 ENCOUNTER — Other Ambulatory Visit: Payer: Self-pay | Admitting: Family Medicine

## 2022-07-31 ENCOUNTER — Other Ambulatory Visit: Payer: Self-pay | Admitting: Neurology

## 2022-07-31 MED ORDER — LEVETIRACETAM 500 MG PO TABS: 500.0000 mg | ORAL_TABLET | Freq: Two times a day (BID) | ORAL | 3 refills | Status: DC

## 2022-07-31 MED ORDER — PREGABALIN 75 MG PO CAPS: 75.0000 mg | ORAL_CAPSULE | Freq: Two times a day (BID) | ORAL | 2 refills | Status: DC

## 2022-07-31 NOTE — Addendum Note (Signed)
Addended by: Andre Lefort on: 07/31/2022 03:02 PM   Modules accepted: Orders

## 2022-07-31 NOTE — Telephone Encounter (Signed)
Last refill of Pregabalin 75 Mg Capsule was on 07/12/2022   60 tabs for 30 days

## 2022-07-31 NOTE — Telephone Encounter (Addendum)
Pt is requesting a refill for pregabalin (LYRICA) 75 MG capsule & levETIRAcetam (KEPPRA) 500 MG tablet.  Pharmacy: [facility]   Pt asked it be noted that the 2 medications are working well for him.

## 2022-08-04 ENCOUNTER — Other Ambulatory Visit: Payer: Self-pay | Admitting: Family Medicine

## 2022-08-04 DIAGNOSIS — I10 Essential (primary) hypertension: Secondary | ICD-10-CM

## 2022-08-18 ENCOUNTER — Encounter: Payer: Self-pay | Admitting: Family Medicine

## 2022-08-18 ENCOUNTER — Ambulatory Visit (INDEPENDENT_AMBULATORY_CARE_PROVIDER_SITE_OTHER): Payer: Medicare Other | Admitting: Family Medicine

## 2022-08-18 VITALS — BP 132/69 | HR 69 | Temp 98.0°F | Wt 182.0 lb

## 2022-08-18 DIAGNOSIS — I10 Essential (primary) hypertension: Secondary | ICD-10-CM

## 2022-08-18 DIAGNOSIS — R7303 Prediabetes: Secondary | ICD-10-CM | POA: Diagnosis not present

## 2022-08-18 DIAGNOSIS — E7800 Pure hypercholesterolemia, unspecified: Secondary | ICD-10-CM | POA: Diagnosis not present

## 2022-08-18 DIAGNOSIS — Z23 Encounter for immunization: Secondary | ICD-10-CM | POA: Diagnosis not present

## 2022-08-18 LAB — BAYER DCA HB A1C WAIVED: HB A1C (BAYER DCA - WAIVED): 6.1 % — ABNORMAL HIGH (ref 4.8–5.6)

## 2022-08-18 NOTE — Progress Notes (Signed)
BP 132/69   Pulse 69   Temp 98 F (36.7 C)   Wt 182 lb (82.6 kg)   SpO2 100%   BMI 27.67 kg/m    Subjective:   Patient ID: Jonathan Love, male    DOB: 10/19/1946, 76 y.o.   MRN: [id number]  HPI: Jonathan Love is a 76 y.o. male presenting on 08/18/2022 for Medical Management of Chronic Issues, Hypertension, and Prediabetes   HPI Prediabetes Patient comes in today for recheck of his diabetes. Patient has been currently taking no medicine currently, diet control. Patient is currently on an ACE inhibitor/ARB. Patient has seen an ophthalmologist this year. Patient denies any new issues with their feet. The symptom started onset as an adult neuropathy and hypertension ARE RELATED TO DM   Hypertension Patient is currently on benazepril, and their blood pressure today is 132/69. Patient denies any lightheadedness or dizziness. Patient denies headaches, blurred vision, chest pains, shortness of breath, or weakness. Denies any side effects from medication and is content with current medication.   Hyperlipidemia Patient is coming in for recheck of his hyperlipidemia. The patient is currently taking atorvastatin. They deny any issues with myalgias or history of liver damage from it. They deny any focal numbness or weakness or chest pain.   Relevant past medical, surgical, family and social history reviewed and updated as indicated. Interim medical history since our last visit reviewed. Allergies and medications reviewed and updated.  Review of Systems  Constitutional:  Negative for chills and fever.  Eyes:  Negative for visual disturbance.  Respiratory:  Negative for shortness of breath and wheezing.   Cardiovascular:  Negative for chest pain and leg swelling.  Musculoskeletal:  Negative for back pain and gait problem.  Skin:  Negative for rash.  Neurological:  Negative for dizziness, weakness and light-headedness.  All other systems reviewed and are negative.   Per HPI unless  specifically indicated above   Allergies as of 08/18/2022       Reactions   Penicillins Other (See Comments)   Seizures        Medication List        Accurate as of August 18, 2022  2:19 PM. If you have any questions, ask your nurse or doctor.          allopurinol 300 MG tablet Commonly known as: ZYLOPRIM Take 1 tablet (300 mg total) by mouth daily.   atorvastatin 40 MG tablet Commonly known as: LIPITOR Take 1 tablet (40 mg total) by mouth daily.   benazepril 40 MG tablet Commonly known as: LOTENSIN TAKE 1 TABLET BY MOUTH DAILY   busPIRone 15 MG tablet Commonly known as: BUSPAR Take 1 tablet (15 mg total) by mouth 2 (two) times daily.   diltiazem 180 MG 24 hr capsule Commonly known as: CARDIZEM CD TAKE 1 CAPSULE BY MOUTH DAILY   fluticasone 50 MCG/ACT nasal spray Commonly known as: FLONASE Place 2 sprays into both nostrils at bedtime as needed for allergies or rhinitis.   levETIRAcetam 500 MG tablet Commonly known as: KEPPRA Take 1 tablet (500 mg total) by mouth 2 (two) times daily.   nabumetone 500 MG tablet Commonly known as: RELAFEN TAKE 1 TABLET BY MOUTH DAILY   omeprazole 40 MG capsule Commonly known as: PRILOSEC TAKE 1 CAPSULE BY MOUTH DAILY   PARoxetine 20 MG tablet Commonly known as: PAXIL Take 2 tablets (40 mg total) by mouth daily.   pregabalin 75 MG capsule Commonly known as: Lyrica Take 1  capsule (75 mg total) by mouth 2 (two) times daily.   tamsulosin 0.4 MG Caps capsule Commonly known as: FLOMAX Take 1 capsule (0.4 mg total) by mouth daily.   traZODone 50 MG tablet Commonly known as: DESYREL Take 0.5-1 tablets (25-50 mg total) by mouth at bedtime as needed for sleep.         Objective:   BP 132/69   Pulse 69   Temp 98 F (36.7 C)   Wt 182 lb (82.6 kg)   SpO2 100%   BMI 27.67 kg/m   Wt Readings from Last 3 Encounters:  08/18/22 182 lb (82.6 kg)  05/18/22 178 lb (80.7 kg)  05/16/22 176 lb 8 oz (80.1 kg)     Physical Exam Vitals and nursing note reviewed.  Constitutional:      General: He is not in acute distress.    Appearance: He is well-developed. He is not diaphoretic.  Eyes:     General: No scleral icterus.    Conjunctiva/sclera: Conjunctivae normal.  Neck:     Thyroid: No thyromegaly.  Cardiovascular:     Rate and Rhythm: Normal rate and regular rhythm.     Heart sounds: Normal heart sounds. No murmur heard. Pulmonary:     Effort: Pulmonary effort is normal. No respiratory distress.     Breath sounds: Normal breath sounds. No wheezing.  Musculoskeletal:        General: No swelling. Normal range of motion.     Cervical back: Neck supple.  Lymphadenopathy:     Cervical: No cervical adenopathy.  Skin:    General: Skin is warm and dry.     Findings: No rash.  Neurological:     Mental Status: He is alert and oriented to person, place, and time.     Coordination: Coordination normal.  Psychiatric:        Behavior: Behavior normal.       Assessment & Plan:   Problem List Items Addressed This Visit       Cardiovascular and Mediastinum   HTN (hypertension) (Chronic)     Other   HLD (hyperlipidemia) (Chronic)   Prediabetes - Primary (Chronic)   Relevant Orders   Bayer DCA Hb A1c Waived   Other Visit Diagnoses     Need for immunization against influenza       Relevant Orders   Flu Vaccine QUAD High Dose(Fluad) (Completed)       A1c looks good at 6.1, no changes.  Continue current medicine. Follow up plan: Return in about 3 months (around 11/18/2022), or if symptoms worsen or fail to improve, for Diabetes hypertension and cholesterol.  Counseling provided for all of the vaccine components Orders Placed This Encounter  Procedures   Flu Vaccine QUAD High Dose(Fluad)   Bayer DCA Hb A1c Waived    Caryl Pina, MD [facility] 08/18/2022, 2:19 PM

## 2022-08-22 ENCOUNTER — Other Ambulatory Visit: Payer: Self-pay | Admitting: Family Medicine

## 2022-08-22 DIAGNOSIS — I10 Essential (primary) hypertension: Secondary | ICD-10-CM

## 2022-10-09 ENCOUNTER — Other Ambulatory Visit: Payer: Self-pay | Admitting: Family Medicine

## 2022-11-03 ENCOUNTER — Other Ambulatory Visit: Payer: Self-pay | Admitting: Family Medicine

## 2022-11-03 DIAGNOSIS — I10 Essential (primary) hypertension: Secondary | ICD-10-CM

## 2022-11-20 ENCOUNTER — Encounter: Payer: Self-pay | Admitting: Neurology

## 2022-11-20 ENCOUNTER — Ambulatory Visit: Payer: Medicare Other | Admitting: Neurology

## 2022-11-20 VITALS — BP 146/83 | HR 65 | Ht 68.0 in | Wt 187.5 lb

## 2022-11-20 DIAGNOSIS — F419 Anxiety disorder, unspecified: Secondary | ICD-10-CM | POA: Diagnosis not present

## 2022-11-20 DIAGNOSIS — G40909 Epilepsy, unspecified, not intractable, without status epilepticus: Secondary | ICD-10-CM | POA: Diagnosis not present

## 2022-11-20 MED ORDER — PREGABALIN 75 MG PO CAPS: 75.0000 mg | ORAL_CAPSULE | Freq: Three times a day (TID) | ORAL | 3 refills | Status: DC

## 2022-11-20 NOTE — Progress Notes (Signed)
[facility]  PATIENT: Jonathan Love DOB: 1946/07/18  REQUESTING CLINICIAN: Dettinger, Fransisca Kaufmann, MD HISTORY FROM: Patient and spouse  REASON FOR VISIT: New onset epilepsy    HISTORICAL  CHIEF COMPLAINT:  Chief Complaint  Patient presents with   Follow-up    Rm 12. Alone. C/o bilateral hand tremors, right worse than left. They occur once daily. He states they may be anxiety related, he is d/c xanax. Denies any new seizures. Requesting refills of pregabalin.   INTERVAL HISTORY 11/20/2022:  Patient presents today for follow-up, last visit was in July, since then he has not had any additional seizures.  He is tolerating the Keppra very well.  He reports that the pregabalin has helped him tremendously with the neuropathic pain.  His main concern is his anxiety.  His primary care doctor discontinue his clonazepam and put him on buspirone and paroxetine.  He reports this combination is not helpful for him.  Would like to get something better.   HISTORY OF PRESENT ILLNESS:  This is a 77 gentleman with PMHx of heart disease, hypertension, hyperlipidemia and peripheral neuropathy who is presenting after new onset of seizure. Wife reports on July 9, she found patient in the bathroom unresponsive after hearing a thud and a faint crying noise. EMS was called and patient was rushed in the hospital. While in the ED, he had a second generalized convulsion requiring Ativan and Keppra load. His MRI brain was negative for acute abnormalities and his EEG was within normal limits. This was the patient first lifetime seizure and after discussion, plan was to start Keppra 500 mg BID. Since started the medication, he denies any side effects other than he is sleeping better at night.  For his peripheral neuropathy, his gabapentin was increased to 300 mg TID but reports no improvement. He reports trying Pregabalin in the past which some improvement of the pain.   Handedness: Right handed    Onset: 05/07/22  Seizure Type: Generalized convulsion   Current frequency: Only once   Any injuries from seizures: Bruises   Seizure risk factors: None reported   Previous ASMs: None   Currenty ASMs: Levetiracetam 500 mg BID   ASMs side effects: none   Brain Images: normal Brain MRI   Previous EEGs: Normal EEG    OTHER MEDICAL CONDITIONS: Hypertension, hyperlipidemia and heart disease, peripheral neuropathy  REVIEW OF SYSTEMS: Full 14 system review of systems performed and negative with exception of: as noted in the HPI   ALLERGIES: Allergies  Allergen Reactions   Penicillins Other (See Comments)    Seizures    HOME MEDICATIONS: Outpatient Medications Prior to Visit  Medication Sig Dispense Refill   allopurinol (ZYLOPRIM) 300 MG tablet Take 1 tablet (300 mg total) by mouth daily. 90 tablet 3   atorvastatin (LIPITOR) 40 MG tablet Take 1 tablet (40 mg total) by mouth daily. 90 tablet 3   benazepril (LOTENSIN) 40 MG tablet TAKE 1 TABLET BY MOUTH DAILY 100 tablet 2   busPIRone (BUSPAR) 15 MG tablet Take 1 tablet (15 mg total) by mouth 2 (two) times daily. 60 tablet 2   diltiazem (CARDIZEM CD) 180 MG 24 hr capsule TAKE 1 CAPSULE BY MOUTH DAILY 100 capsule 0   fluticasone (FLONASE) 50 MCG/ACT nasal spray Place 2 sprays into both nostrils at bedtime as needed for allergies or rhinitis. 16 g 6   levETIRAcetam (KEPPRA) 500 MG tablet Take 1 tablet (500 mg total) by mouth 2 (two) times daily. 180 tablet  3   nabumetone (RELAFEN) 500 MG tablet TAKE 1 TABLET BY MOUTH DAILY 100 tablet 0   omeprazole (PRILOSEC) 40 MG capsule TAKE 1 CAPSULE BY MOUTH DAILY 100 capsule 1   PARoxetine (PAXIL) 20 MG tablet Take 2 tablets (40 mg total) by mouth daily. 180 tablet 3   tamsulosin (FLOMAX) 0.4 MG CAPS capsule Take 1 capsule (0.4 mg total) by mouth daily. 90 capsule 3   traZODone (DESYREL) 50 MG tablet Take 0.5-1 tablets (25-50 mg total) by mouth at bedtime as needed for sleep. 30 tablet 3    pregabalin (LYRICA) 75 MG capsule Take 1 capsule (75 mg total) by mouth 2 (two) times daily. 60 capsule 2   No facility-administered medications prior to visit.    PAST MEDICAL HISTORY: Past Medical History:  Diagnosis Date   Anxiety    Cataract    CTS (carpal tunnel syndrome)    left   Diverticulitis    GERD (gastroesophageal reflux disease)    Gout    H/O agent Orange exposure    Hyperlipidemia    Hypertension    Melanoma ([facility]) 04/2020   right cheek   Migraine    Neuropathy    OAB (overactive bladder)    Prediabetes     PAST SURGICAL HISTORY: Past Surgical History:  Procedure Laterality Date   EYE SURGERY Left 08/24/2016   retinal - dr Rodman Key = laser eye   MELANOMA EXCISION Right    cheek   right lateral epicondylitis     vocal cord nodule      FAMILY HISTORY: Family History  Problem Relation Age of Onset   Stomach cancer Father    Lung cancer Father    Heart disease Mother     SOCIAL HISTORY: Social History   Socioeconomic History   Marital status: Married    Spouse name: Not on file   Number of children: Not on file   Years of education: Not on file   Highest education level: Not on file  Occupational History   Not on file  Tobacco Use   Smoking status: Never   Smokeless tobacco: Never  Vaping Use   Vaping Use: Never used  Substance and Sexual Activity   Alcohol use: No   Drug use: No   Sexual activity: Not on file  Other Topics Concern   Not on file  Social History Narrative   Not on file   Social Determinants of Health   Financial Resource Strain: Not on file  Food Insecurity: Not on file  Transportation Needs: Not on file  Physical Activity: Not on file  Stress: Not on file  Social Connections: Not on file  Intimate Partner Violence: Not on file    PHYSICAL EXAM  GENERAL EXAM/CONSTITUTIONAL: Vitals:  Vitals:   11/20/22 1334  BP: (!) 146/83  Pulse: 65  Weight: 187 lb 8 oz (85 kg)  Height: '5\' 8"'$  (1.727 m)   Body mass  index is 28.51 kg/m. Wt Readings from Last 3 Encounters:  11/20/22 187 lb 8 oz (85 kg)  08/18/22 182 lb (82.6 kg)  05/18/22 178 lb (80.7 kg)   Patient is in no distress; well developed, nourished and groomed; neck is supple  EYES: Visual fields full to confrontation, Extraocular movements intacts,  No results found.  MUSCULOSKELETAL: Gait, strength, tone, movements noted in Neurologic exam below  NEUROLOGIC: MENTAL STATUS:      No data to display         awake, alert, oriented to  person, place and time recent and remote memory intact normal attention and concentration language fluent, comprehension intact, naming intact fund of knowledge appropriate  CRANIAL NERVE:  2nd, 3rd, 4th, 6th - visual fields full to confrontation, extraocular muscles intact, no nystagmus 5th - facial sensation symmetric 7th - facial strength symmetric 8th - hearing intact 9th - palate elevates symmetrically, uvula midline 11th - shoulder shrug symmetric 12th - tongue protrusion midline  MOTOR:  normal bulk and tone, full strength in the BUE, BLE  SENSORY:  normal and symmetric to light touch  COORDINATION:  finger-nose-finger, fine finger movements normal  REFLEXES:  deep tendon reflexes present and symmetric  GAIT/STATION:  Normal, did not use a cane     DIAGNOSTIC DATA (LABS, IMAGING, TESTING) - I reviewed patient records, labs, notes, testing and imaging myself where available.  Lab Results  Component Value Date   WBC 7.9 05/18/2022   HGB 14.2 05/18/2022   HCT 42.6 05/18/2022   MCV 90 05/18/2022   PLT 238 05/18/2022      Component Value Date/Time   NA 143 05/18/2022 1454   K 5.3 (H) 05/18/2022 1454   CL 102 05/18/2022 1454   CO2 27 05/18/2022 1454   GLUCOSE 109 (H) 05/18/2022 1454   GLUCOSE 107 (H) 05/08/2022 0333   BUN 11 05/18/2022 1454   CREATININE 1.03 05/18/2022 1454   CREATININE 1.21 04/09/2013 1308   CALCIUM 9.7 05/18/2022 1454   PROT 6.7 05/18/2022  1454   ALBUMIN 4.5 05/18/2022 1454   AST 15 05/18/2022 1454   ALT 26 05/18/2022 1454   ALKPHOS 168 (H) 05/18/2022 1454   BILITOT 0.4 05/18/2022 1454   GFRNONAA >60 05/08/2022 0333   GFRNONAA 62 04/09/2013 1308   GFRAA 83 07/09/2020 1349   GFRAA 72 04/09/2013 1308   Lab Results  Component Value Date   CHOL 169 05/18/2022   HDL 50 05/18/2022   LDLCALC 82 05/18/2022   TRIG 221 (H) 05/18/2022   Lab Results  Component Value Date   HGBA1C 6.1 (H) 08/18/2022   No results found for: "VITAMINB12" Lab Results  Component Value Date   TSH 3.726 05/07/2022    MRI Brain 05/08/22 Left scalp edema/hematoma. No evidence of underlying skull fracture. No traumatic intracranial finding. Mild to low moderate chronic small-vessel ischemic changes of the cerebral hemispheric white matter, most prominent in the left frontal region. Age related brain volume loss. No finding to specifically explain seizure  EEG 05/08/22 This study is within normal limits. No seizures or epileptiform discharges were seen throughout the recording    ASSESSMENT AND PLAN  77 y.o. year old male  with history of hypertension, hyperlipidemia, CAD and peripheral neuropathy who is presenting for follow up for his seizures.  In terms of the seizures, he is doing well, denies any seizures since last visit.  He is compliant with the Keppra 500 mg twice daily.  In terms of neuropathy, he reports that the pregabalin 75 mg twice daily has helped him control his pain.  Due to his ongoing symptoms of anxiety despite being on Buspar and Paxil, I will increase it to 75 mg 3 times daily.  Advised the patient to contact me for any other concern.  I will see him in 1 year for follow-up.    1. Seizure disorder ([place])   2. Anxiety      Patient Instructions  Continue with Keppra 500 mg twice daily Increase pregabalin to 75 mg 3 times daily Continue to follow with  PCP Follow-up in 1 year or sooner if worse   Per [facility]  statutes, patients with seizures are not allowed to drive until they have been seizure-free for six months.  Other recommendations include using caution when using heavy equipment or power tools. Avoid working on ladders or at heights. Take showers instead of baths.  Do not swim alone.  Ensure the water temperature is not too high on the home water heater. Do not go swimming alone. Do not lock yourself in a room alone (i.e. bathroom). When caring for infants or small children, sit down when holding, feeding, or changing them to minimize risk of injury to the child in the event you have a seizure. Maintain good sleep hygiene. Avoid alcohol.  Also recommend adequate sleep, hydration, good diet and minimize stress.   During the Seizure  - First, ensure adequate ventilation and place patients on the floor on their left side  Loosen clothing around the neck and ensure the airway is patent. If the patient is clenching the teeth, do not force the mouth open with any object as this can cause severe damage - Remove all items from the surrounding that can be hazardous. The patient may be oblivious to what's happening and may not even know what he or she is doing. If the patient is confused and wandering, either gently guide him/her away and block access to outside areas - Reassure the individual and be comforting - Call 911. In most cases, the seizure ends before EMS arrives. However, there are cases when seizures may last over 3 to 5 minutes. Or the individual may have developed breathing difficulties or severe injuries. If a pregnant patient or a person with diabetes develops a seizure, it is prudent to call an ambulance. - Finally, if the patient does not regain full consciousness, then call EMS. Most patients will remain confused for about 45 to 90 minutes after a seizure, so you must use judgment in calling for help. - Avoid restraints but make sure the patient is in a bed with padded side rails - Place the  individual in a lateral position with the neck slightly flexed; this will help the saliva drain from the mouth and prevent the tongue from falling backward - Remove all nearby furniture and other hazards from the area - Provide verbal assurance as the individual is regaining consciousness - Provide the patient with privacy if possible - Call for help and start treatment as ordered by the caregiver   After the Seizure (Postictal Stage)  After a seizure, most patients experience confusion, fatigue, muscle pain and/or a headache. Thus, one should permit the individual to sleep. For the next few days, reassurance is essential. Being calm and helping reorient the person is also of importance.  Most seizures are painless and end spontaneously. Seizures are not harmful to others but can lead to complications such as stress on the lungs, brain and the heart. Individuals with prior lung problems may develop labored breathing and respiratory distress.     No orders of the defined types were placed in this encounter.   Meds ordered this encounter  Medications   pregabalin (LYRICA) 75 MG capsule    Sig: Take 1 capsule (75 mg total) by mouth 3 (three) times daily.    Dispense:  180 capsule    Refill:  3    Return in about 1 year (around 11/21/2023).   Alric Ran, MD 11/20/2022, 2:09 PM  [facility] [address]  [place], [place] [address] [phone]

## 2022-11-20 NOTE — Patient Instructions (Signed)
Continue with Keppra 500 mg twice daily Increase pregabalin to 75 mg 3 times daily Continue to follow with PCP Follow-up in 1 year or sooner if worse

## 2022-11-29 ENCOUNTER — Encounter: Payer: Self-pay | Admitting: Family Medicine

## 2022-11-29 ENCOUNTER — Ambulatory Visit (INDEPENDENT_AMBULATORY_CARE_PROVIDER_SITE_OTHER): Payer: Medicare Other | Admitting: Family Medicine

## 2022-11-29 VITALS — BP 137/88 | HR 64 | Ht 68.0 in | Wt 188.0 lb

## 2022-11-29 DIAGNOSIS — F419 Anxiety disorder, unspecified: Secondary | ICD-10-CM | POA: Diagnosis not present

## 2022-11-29 DIAGNOSIS — E7800 Pure hypercholesterolemia, unspecified: Secondary | ICD-10-CM

## 2022-11-29 DIAGNOSIS — I10 Essential (primary) hypertension: Secondary | ICD-10-CM

## 2022-11-29 DIAGNOSIS — G250 Essential tremor: Secondary | ICD-10-CM

## 2022-11-29 DIAGNOSIS — N400 Enlarged prostate without lower urinary tract symptoms: Secondary | ICD-10-CM

## 2022-11-29 DIAGNOSIS — R7303 Prediabetes: Secondary | ICD-10-CM | POA: Diagnosis not present

## 2022-11-29 LAB — BAYER DCA HB A1C WAIVED: HB A1C (BAYER DCA - WAIVED): 6.1 % — ABNORMAL HIGH (ref 4.8–5.6)

## 2022-11-29 MED ORDER — ALLOPURINOL 300 MG PO TABS: 300.0000 mg | ORAL_TABLET | Freq: Every day | ORAL | 3 refills | Status: DC

## 2022-11-29 MED ORDER — BUSPIRONE HCL 15 MG PO TABS: 15.0000 mg | ORAL_TABLET | Freq: Two times a day (BID) | ORAL | 3 refills | Status: DC

## 2022-11-29 MED ORDER — DILTIAZEM HCL ER COATED BEADS 180 MG PO CP24: 180.0000 mg | ORAL_CAPSULE | Freq: Every day | ORAL | 3 refills | Status: DC

## 2022-11-29 MED ORDER — TAMSULOSIN HCL 0.4 MG PO CAPS: 0.8000 mg | ORAL_CAPSULE | Freq: Every day | ORAL | 3 refills | Status: DC

## 2022-11-29 MED ORDER — ATORVASTATIN CALCIUM 40 MG PO TABS: 40.0000 mg | ORAL_TABLET | Freq: Every day | ORAL | 3 refills | Status: DC

## 2022-11-29 MED ORDER — PAROXETINE HCL 20 MG PO TABS: 40.0000 mg | ORAL_TABLET | Freq: Every day | ORAL | 3 refills | Status: DC

## 2022-11-29 MED ORDER — PROPRANOLOL HCL ER 80 MG PO CP24: 80.0000 mg | ORAL_CAPSULE | Freq: Every day | ORAL | 3 refills | Status: DC

## 2022-11-29 NOTE — Progress Notes (Signed)
BP 137/88   Pulse 64   Ht '5\' 8"'$  (1.727 m)   Wt 188 lb (85.3 kg)   SpO2 98%   BMI 28.59 kg/m    Subjective:   Patient ID: Jonathan Love, male    DOB: December 27, 1945, 77 y.o.   MRN: [id number]  HPI: Jonathan Love is a 77 y.o. male presenting on 11/29/2022 for Medical Management of Chronic Issues, Hypertension, Prediabetes, and Hyperlipidemia   HPI Anxiety recheck Is coming in today for anxiety recheck.  He is currently on Paxil and buspirone and feels like they are helping except for the fact that he is having some tremors still.  He did see neurology and they did not think the tremors were anything but benign essential tremors.  The tremors are bothering him still especially when he goes into social situations.  Patient denies any suicidal ideations.    11/29/2022    2:11 PM 08/18/2022    2:05 PM 08/18/2022    2:04 PM 05/18/2022    2:17 PM 05/18/2022    2:16 PM  Depression screen PHQ 2/9  Decreased Interest 0  0  0  Down, Depressed, Hopeless 0  0  0  PHQ - 2 Score 0  0  0  Altered sleeping  2  2   Tired, decreased energy  1  1   Change in appetite  1  0   Feeling bad or failure about yourself   0  0   Trouble concentrating  1  1   Moving slowly or fidgety/restless  0  0   Suicidal thoughts  0  0   Difficult doing work/chores  Somewhat difficult  Somewhat difficult     Prediabetes Patient comes in today for recheck of his diabetes. Patient has been currently taking no medicine currently. Patient benazepril currently on an ACE inhibitor/ARB. Patient has not seen an ophthalmologist this year. Patient denies any issues with their feet. The symptom started onset as an adult hypertension and hyperlipidemia ARE RELATED TO DM   Hypertension Patient is currently on benazepril and Cardizem, and their blood pressure today is 137/88. Patient denies any lightheadedness or dizziness. Patient denies headaches, blurred vision, chest pains, shortness of breath, or weakness. Denies any side  effects from medication and is content with current medication.   Hyperlipidemia Patient is coming in for recheck of his hyperlipidemia. The patient is currently taking atorvastatin. They deny any issues with myalgias or history of liver damage from it. They deny any focal numbness or weakness or chest pain.  BPH Patient is coming in for recheck on BPH Symptoms: Nocturia Medication: Flomax, would like to increase it Last PSA:     Relevant past medical, surgical, family and social history reviewed and updated as indicated. Interim medical history since our last visit reviewed. Allergies and medications reviewed and updated.  Review of Systems  Constitutional:  Negative for chills and fever.  Respiratory:  Negative for shortness of breath and wheezing.   Cardiovascular:  Negative for chest pain and leg swelling.  Musculoskeletal:  Negative for back pain and gait problem.  Skin:  Negative for rash.  Neurological:  Positive for tremors. Negative for dizziness, light-headedness and headaches.  Psychiatric/Behavioral:  Positive for sleep disturbance. Negative for decreased concentration, dysphoric mood, self-injury and suicidal ideas. The patient is nervous/anxious.   All other systems reviewed and are negative.   Per HPI unless specifically indicated above   Allergies as of 11/29/2022  Reactions   Penicillins Other (See Comments)   Seizures        Medication List        Accurate as of November 29, 2022  3:06 PM. If you have any questions, ask your nurse or doctor.          allopurinol 300 MG tablet Commonly known as: ZYLOPRIM Take 1 tablet (300 mg total) by mouth daily.   atorvastatin 40 MG tablet Commonly known as: LIPITOR Take 1 tablet (40 mg total) by mouth daily.   benazepril 40 MG tablet Commonly known as: LOTENSIN TAKE 1 TABLET BY MOUTH DAILY   busPIRone 15 MG tablet Commonly known as: BUSPAR Take 1 tablet (15 mg total) by mouth 2 (two) times daily.    diltiazem 180 MG 24 hr capsule Commonly known as: CARDIZEM CD Take 1 capsule (180 mg total) by mouth daily.   fluticasone 50 MCG/ACT nasal spray Commonly known as: FLONASE Place 2 sprays into both nostrils at bedtime as needed for allergies or rhinitis.   levETIRAcetam 500 MG tablet Commonly known as: KEPPRA Take 1 tablet (500 mg total) by mouth 2 (two) times daily.   nabumetone 500 MG tablet Commonly known as: RELAFEN TAKE 1 TABLET BY MOUTH DAILY   omeprazole 40 MG capsule Commonly known as: PRILOSEC TAKE 1 CAPSULE BY MOUTH DAILY   PARoxetine 20 MG tablet Commonly known as: PAXIL Take 2 tablets (40 mg total) by mouth daily.   pregabalin 75 MG capsule Commonly known as: Lyrica Take 1 capsule (75 mg total) by mouth 3 (three) times daily.   propranolol ER 80 MG 24 hr capsule Commonly known as: Inderal LA Take 1 capsule (80 mg total) by mouth at bedtime. Started by: Worthy Rancher, MD   tamsulosin 0.4 MG Caps capsule Commonly known as: FLOMAX Take 2 capsules (0.8 mg total) by mouth daily. What changed: how much to take Changed by: Worthy Rancher, MD   traZODone 50 MG tablet Commonly known as: DESYREL Take 0.5-1 tablets (25-50 mg total) by mouth at bedtime as needed for sleep.         Objective:   BP 137/88   Pulse 64   Ht '5\' 8"'$  (1.727 m)   Wt 188 lb (85.3 kg)   SpO2 98%   BMI 28.59 kg/m   Wt Readings from Last 3 Encounters:  11/29/22 188 lb (85.3 kg)  11/20/22 187 lb 8 oz (85 kg)  08/18/22 182 lb (82.6 kg)    Physical Exam Vitals and nursing note reviewed.  Constitutional:      General: He is not in acute distress.    Appearance: He is well-developed. He is not diaphoretic.  Eyes:     General: No scleral icterus.    Conjunctiva/sclera: Conjunctivae normal.  Neck:     Thyroid: No thyromegaly.  Cardiovascular:     Rate and Rhythm: Normal rate and regular rhythm.     Heart sounds: Normal heart sounds. No murmur heard. Pulmonary:      Effort: Pulmonary effort is normal. No respiratory distress.     Breath sounds: Normal breath sounds. No wheezing.  Musculoskeletal:        General: No swelling. Normal range of motion.     Cervical back: Neck supple.  Lymphadenopathy:     Cervical: No cervical adenopathy.  Skin:    General: Skin is warm and dry.     Findings: No rash.  Neurological:     Mental Status: He is alert and  oriented to person, place, and time.     Coordination: Coordination normal.  Psychiatric:        Behavior: Behavior normal.       Assessment & Plan:   Problem List Items Addressed This Visit       Cardiovascular and Mediastinum   HTN (hypertension) (Chronic)   Relevant Medications   diltiazem (CARDIZEM CD) 180 MG 24 hr capsule   atorvastatin (LIPITOR) 40 MG tablet   propranolol ER (INDERAL LA) 80 MG 24 hr capsule   Other Relevant Orders   CBC with Differential/Platelet   CMP14+EGFR   Lipid panel   Bayer DCA Hb A1c Waived     Genitourinary   BPH (benign prostatic hyperplasia) (Chronic)   Relevant Medications   tamsulosin (FLOMAX) 0.4 MG CAPS capsule     Other   HLD (hyperlipidemia) (Chronic)   Relevant Medications   diltiazem (CARDIZEM CD) 180 MG 24 hr capsule   atorvastatin (LIPITOR) 40 MG tablet   propranolol ER (INDERAL LA) 80 MG 24 hr capsule   Other Relevant Orders   CBC with Differential/Platelet   CMP14+EGFR   Lipid panel   Bayer DCA Hb A1c Waived   Prediabetes - Primary (Chronic)   Relevant Orders   CBC with Differential/Platelet   CMP14+EGFR   Lipid panel   Bayer DCA Hb A1c Waived   Anxiety (Chronic)   Relevant Medications   busPIRone (BUSPAR) 15 MG tablet   PARoxetine (PAXIL) 20 MG tablet   Other Visit Diagnoses     Benign essential tremor       Relevant Medications   propranolol ER (INDERAL LA) 80 MG 24 hr capsule       Will add propranolol to help with tremors.  Will also increase his Flomax to see if it helps more with his prostate and nocturia  symptoms. A1c looks good at 6.1.  No change Blood pressure everything else looks good, no change in medication. Follow up plan: Return in about 3 months (around 02/27/2023), or if symptoms worsen or fail to improve, for Prediabetes and hyperlipidemia and hypertension recheck.  Counseling provided for all of the vaccine components Orders Placed This Encounter  Procedures   CBC with Differential/Platelet   CMP14+EGFR   Lipid panel   Bayer DCA Hb A1c [place], MD [facility] 11/29/2022, 3:06 PM

## 2022-11-30 LAB — CMP14+EGFR
ALT: 17 IU/L (ref 0–44)
AST: 17 IU/L (ref 0–40)
Albumin/Globulin Ratio: 2.3 — ABNORMAL HIGH (ref 1.2–2.2)
Albumin: 4.5 g/dL (ref 3.8–4.8)
Alkaline Phosphatase: 135 IU/L — ABNORMAL HIGH (ref 44–121)
BUN/Creatinine Ratio: 11 (ref 10–24)
BUN: 12 mg/dL (ref 8–27)
Bilirubin Total: 0.5 mg/dL (ref 0.0–1.2)
CO2: 25 mmol/L (ref 20–29)
Calcium: 9.3 mg/dL (ref 8.6–10.2)
Chloride: 101 mmol/L (ref 96–106)
Creatinine, Ser: 1.11 mg/dL (ref 0.76–1.27)
Globulin, Total: 2 g/dL (ref 1.5–4.5)
Glucose: 89 mg/dL (ref 70–99)
Potassium: 4.8 mmol/L (ref 3.5–5.2)
Sodium: 141 mmol/L (ref 134–144)
Total Protein: 6.5 g/dL (ref 6.0–8.5)
eGFR: 69 mL/min/{1.73_m2} (ref >59)

## 2022-11-30 LAB — CBC WITH DIFFERENTIAL/PLATELET
Basophils Absolute: 0.1 10*3/uL (ref 0.0–0.2)
Basos: 1 %
EOS (ABSOLUTE): 0.6 10*3/uL — ABNORMAL HIGH (ref 0.0–0.4)
Eos: 8 %
Hematocrit: 42.3 % (ref 37.5–51.0)
Hemoglobin: 14.4 g/dL (ref 13.0–17.7)
Immature Grans (Abs): 0 10*3/uL (ref 0.0–0.1)
Immature Granulocytes: 0 %
Lymphocytes Absolute: 1.6 10*3/uL (ref 0.7–3.1)
Lymphs: 22 %
MCH: 29.4 pg (ref 26.6–33.0)
MCHC: 34 g/dL (ref 31.5–35.7)
MCV: 86 fL (ref 79–97)
Monocytes Absolute: 0.5 10*3/uL (ref 0.1–0.9)
Monocytes: 7 %
Neutrophils Absolute: 4.6 10*3/uL (ref 1.4–7.0)
Neutrophils: 62 %
Platelets: 164 10*3/uL (ref 150–450)
RBC: 4.9 x10E6/uL (ref 4.14–5.80)
RDW: 13.4 % (ref 11.6–15.4)
WBC: 7.4 10*3/uL (ref 3.4–10.8)

## 2022-11-30 LAB — LIPID PANEL
Chol/HDL Ratio: 4 ratio (ref 0.0–5.0)
Cholesterol, Total: 192 mg/dL (ref 100–199)
HDL: 48 mg/dL (ref >39)
LDL Chol Calc (NIH): 104 mg/dL — ABNORMAL HIGH (ref 0–99)
Triglycerides: 232 mg/dL — ABNORMAL HIGH (ref 0–149)
VLDL Cholesterol Cal: 40 mg/dL (ref 5–40)

## 2022-12-24 ENCOUNTER — Other Ambulatory Visit: Payer: Self-pay | Admitting: Family Medicine

## 2022-12-24 DIAGNOSIS — K219 Gastro-esophageal reflux disease without esophagitis: Secondary | ICD-10-CM

## 2022-12-25 NOTE — Telephone Encounter (Signed)
Last OV 10/2022. Last RF 10/10/22. Next OV 02/28/23

## 2023-02-28 ENCOUNTER — Encounter: Payer: Self-pay | Admitting: Family Medicine

## 2023-02-28 ENCOUNTER — Telehealth: Payer: Self-pay | Admitting: Family Medicine

## 2023-02-28 ENCOUNTER — Ambulatory Visit (INDEPENDENT_AMBULATORY_CARE_PROVIDER_SITE_OTHER): Payer: Medicare Other | Admitting: Family Medicine

## 2023-02-28 VITALS — BP 130/76 | HR 54 | Ht 68.0 in | Wt 188.0 lb

## 2023-02-28 DIAGNOSIS — E7800 Pure hypercholesterolemia, unspecified: Secondary | ICD-10-CM

## 2023-02-28 DIAGNOSIS — I10 Essential (primary) hypertension: Secondary | ICD-10-CM

## 2023-02-28 DIAGNOSIS — R7303 Prediabetes: Secondary | ICD-10-CM

## 2023-02-28 LAB — BAYER DCA HB A1C WAIVED: HB A1C (BAYER DCA - WAIVED): 6.3 % — ABNORMAL HIGH (ref 4.8–5.6)

## 2023-02-28 NOTE — Telephone Encounter (Signed)
Contacted Jonathan Love to schedule their annual wellness visit. Patient declined to schedule AWV at this time.  Per wife -patient is not interested  Thank you,  Judeth Cornfield,  AMB Clinical Support [facility] AWV Program Direct Dial ??[phone]

## 2023-02-28 NOTE — Progress Notes (Signed)
BP 130/76   Pulse (!) 54   Ht 5\' 8"  (1.727 m)   Wt 188 lb (85.3 kg)   SpO2 95%   BMI 28.59 kg/m    Subjective:   Patient ID: Jonathan Love, male    DOB: 03-Feb-1946, 77 y.o.   MRN: [id number]  HPI: Jonathan Love is a 77 y.o. male presenting on 02/28/2023 for Medical Management of Chronic Issues, Prediabetes, and Hypertension   HPI Prediabetes Patient comes in today for recheck of his diabetes. Patient has been currently taking no medication, has been diet controlled. Patient is currently on an ACE inhibitor/ARB. Patient has not seen an ophthalmologist this year. Patient denies any new issues with their feet. The symptom started onset as an adult hypertension and hyperlipidemia and neuropathy ARE RELATED TO DM   Hypertension Patient is currently on benazepril and diltiazem and propranolol, and their blood pressure today is 130/76. Patient denies any lightheadedness or dizziness. Patient denies headaches, blurred vision, chest pains, shortness of breath, or weakness. Denies any side effects from medication and is content with current medication.  Propranolol is also meant to help with tremors and he says it is helping a lot and he is sleeping better.  Hyperlipidemia Patient is coming in for recheck of his hyperlipidemia. The patient is currently taking atorvastatin. They deny any issues with myalgias or history of liver damage from it. They deny any focal numbness or weakness or chest pain.   Relevant past medical, surgical, family and social history reviewed and updated as indicated. Interim medical history since our last visit reviewed. Allergies and medications reviewed and updated.  Review of Systems  Constitutional:  Negative for chills and fever.  Respiratory:  Negative for shortness of breath and wheezing.   Cardiovascular:  Negative for chest pain and leg swelling.  Musculoskeletal:  Negative for back pain and gait problem.  Skin:  Negative for rash.  Neurological:   Positive for tremors. Negative for dizziness, weakness, light-headedness and headaches.  All other systems reviewed and are negative.   Per HPI unless specifically indicated above   Allergies as of 02/28/2023       Reactions   Penicillins Other (See Comments)   Seizures        Medication List        Accurate as of Feb 28, 2023  1:17 PM. If you have any questions, ask your nurse or doctor.          allopurinol 300 MG tablet Commonly known as: ZYLOPRIM Take 1 tablet (300 mg total) by mouth daily.   atorvastatin 40 MG tablet Commonly known as: LIPITOR Take 1 tablet (40 mg total) by mouth daily.   benazepril 40 MG tablet Commonly known as: LOTENSIN TAKE 1 TABLET BY MOUTH DAILY   busPIRone 15 MG tablet Commonly known as: BUSPAR Take 1 tablet (15 mg total) by mouth 2 (two) times daily.   diltiazem 180 MG 24 hr capsule Commonly known as: CARDIZEM CD Take 1 capsule (180 mg total) by mouth daily.   fluticasone 50 MCG/ACT nasal spray Commonly known as: FLONASE Place 2 sprays into both nostrils at bedtime as needed for allergies or rhinitis.   levETIRAcetam 500 MG tablet Commonly known as: KEPPRA Take 1 tablet (500 mg total) by mouth 2 (two) times daily.   nabumetone 500 MG tablet Commonly known as: RELAFEN TAKE 1 TABLET BY MOUTH DAILY   omeprazole 40 MG capsule Commonly known as: PRILOSEC TAKE 1 CAPSULE BY MOUTH DAILY  PARoxetine 20 MG tablet Commonly known as: PAXIL Take 2 tablets (40 mg total) by mouth daily.   pregabalin 75 MG capsule Commonly known as: Lyrica Take 1 capsule (75 mg total) by mouth 3 (three) times daily.   propranolol ER 80 MG 24 hr capsule Commonly known as: Inderal LA Take 1 capsule (80 mg total) by mouth at bedtime.   tamsulosin 0.4 MG Caps capsule Commonly known as: FLOMAX Take 2 capsules (0.8 mg total) by mouth daily.   traZODone 50 MG tablet Commonly known as: DESYREL Take 0.5-1 tablets (25-50 mg total) by mouth at bedtime  as needed for sleep.         Objective:   BP 130/76   Pulse (!) 54   Ht 5\' 8"  (1.727 m)   Wt 188 lb (85.3 kg)   SpO2 95%   BMI 28.59 kg/m   Wt Readings from Last 3 Encounters:  02/28/23 188 lb (85.3 kg)  11/29/22 188 lb (85.3 kg)  11/20/22 187 lb 8 oz (85 kg)    Physical Exam Vitals and nursing note reviewed.  Constitutional:      General: He is not in acute distress.    Appearance: He is well-developed. He is not diaphoretic.  Eyes:     General: No scleral icterus.    Conjunctiva/sclera: Conjunctivae normal.  Neck:     Thyroid: No thyromegaly.  Cardiovascular:     Rate and Rhythm: Normal rate and regular rhythm.     Heart sounds: Normal heart sounds. No murmur heard. Pulmonary:     Effort: Pulmonary effort is normal. No respiratory distress.     Breath sounds: Normal breath sounds. No wheezing.  Musculoskeletal:        General: No swelling. Normal range of motion.     Cervical back: Neck supple.  Lymphadenopathy:     Cervical: No cervical adenopathy.  Skin:    General: Skin is warm and dry.     Findings: No rash.  Neurological:     Mental Status: He is alert and oriented to person, place, and time.     Coordination: Coordination normal.  Psychiatric:        Behavior: Behavior normal.       Assessment & Plan:   Problem List Items Addressed This Visit       Cardiovascular and Mediastinum   HTN (hypertension) (Chronic)     Other   HLD (hyperlipidemia) (Chronic)   Prediabetes - Primary (Chronic)   Relevant Orders   Bayer DCA Hb A1c Waived    A1c is 6.3 which is good.  No changes Follow up plan: Return in about 3 months (around 05/31/2023), or if symptoms worsen or fail to improve, for Prediabetes and hypertension and cholesterol.  Counseling provided for all of the vaccine components Orders Placed This Encounter  Procedures   Bayer DCA Hb A1c Waived    Arville Care, MD [facility] 02/28/2023, 1:17 PM

## 2023-04-19 ENCOUNTER — Other Ambulatory Visit: Payer: Self-pay | Admitting: Family Medicine

## 2023-04-19 DIAGNOSIS — I10 Essential (primary) hypertension: Secondary | ICD-10-CM

## 2023-05-09 ENCOUNTER — Other Ambulatory Visit: Payer: Self-pay | Admitting: Neurology

## 2023-06-01 ENCOUNTER — Ambulatory Visit: Payer: Medicare Other | Admitting: Family Medicine

## 2023-06-28 ENCOUNTER — Encounter: Payer: Self-pay | Admitting: Family Medicine

## 2023-06-28 ENCOUNTER — Other Ambulatory Visit: Payer: Self-pay | Admitting: Family Medicine

## 2023-06-28 ENCOUNTER — Ambulatory Visit (INDEPENDENT_AMBULATORY_CARE_PROVIDER_SITE_OTHER): Payer: Medicare Other | Admitting: Family Medicine

## 2023-06-28 VITALS — BP 111/62 | HR 56 | Ht 68.0 in | Wt 191.0 lb

## 2023-06-28 DIAGNOSIS — K219 Gastro-esophageal reflux disease without esophagitis: Secondary | ICD-10-CM

## 2023-06-28 DIAGNOSIS — I10 Essential (primary) hypertension: Secondary | ICD-10-CM | POA: Diagnosis not present

## 2023-06-28 DIAGNOSIS — J301 Allergic rhinitis due to pollen: Secondary | ICD-10-CM

## 2023-06-28 DIAGNOSIS — Z23 Encounter for immunization: Secondary | ICD-10-CM | POA: Diagnosis not present

## 2023-06-28 DIAGNOSIS — E7800 Pure hypercholesterolemia, unspecified: Secondary | ICD-10-CM | POA: Diagnosis not present

## 2023-06-28 DIAGNOSIS — N400 Enlarged prostate without lower urinary tract symptoms: Secondary | ICD-10-CM | POA: Diagnosis not present

## 2023-06-28 DIAGNOSIS — R7303 Prediabetes: Secondary | ICD-10-CM | POA: Diagnosis not present

## 2023-06-28 NOTE — Progress Notes (Signed)
BP 111/62   Pulse (!) 56   Ht 5\' 8"  (1.727 m)   Wt 191 lb (86.6 kg)   SpO2 96%   BMI 29.04 kg/m    Subjective:   Patient ID: Jonathan Love, male    DOB: Oct 04, 1946, 77 y.o.   MRN: [id number]  HPI: Jonathan Love is a 77 y.o. male presenting on 06/28/2023 for Medical Management of Chronic Issues, Hypertension, and Prediabetes   HPI Prediabetes Patient comes in today for recheck of his diabetes. Patient has been currently taking no medicine currently, diet control. Patient is currently on an ACE inhibitor/ARB. Patient has not seen an ophthalmologist this year. Patient denies any new issues with their feet except arthritis and gout verified 7 on them.. The symptom started onset as an adult hypertension and hyperlipidemia ARE RELATED TO DM   Hypertension Patient is currently on benazepril and diltiazem and propranolol, and their blood pressure today is 111/62. Patient denies any lightheadedness or dizziness. Patient denies headaches, blurred vision, chest pains, shortness of breath, or weakness. Denies any side effects from medication and is content with current medication.   Hyperlipidemia Patient is coming in for recheck of his hyperlipidemia. The patient is currently taking atorvastatin. They deny any issues with myalgias or history of liver damage from it. They deny any focal numbness or weakness or chest pain.   GERD Patient is currently on omeprazole.  She denies any major symptoms or abdominal pain or belching or burping. She denies any blood in her stool or lightheadedness or dizziness.   Relevant past medical, surgical, family and social history reviewed and updated as indicated. Interim medical history since our last visit reviewed. Allergies and medications reviewed and updated.  Review of Systems  Constitutional:  Negative for chills and fever.  Eyes:  Negative for visual disturbance.  Respiratory:  Negative for shortness of breath and wheezing.   Cardiovascular:   Negative for chest pain and leg swelling.  Musculoskeletal:  Positive for arthralgias. Negative for back pain and gait problem.  Skin:  Negative for rash.  Neurological:  Negative for dizziness, light-headedness and numbness.  All other systems reviewed and are negative.   Per HPI unless specifically indicated above   Allergies as of 06/28/2023       Reactions   Penicillins Other (See Comments)   Seizures        Medication List        Accurate as of June 28, 2023  4:28 PM. If you have any questions, ask your nurse or doctor.          allopurinol 300 MG tablet Commonly known as: ZYLOPRIM Take 1 tablet (300 mg total) by mouth daily.   atorvastatin 40 MG tablet Commonly known as: LIPITOR Take 1 tablet (40 mg total) by mouth daily.   benazepril 40 MG tablet Commonly known as: LOTENSIN TAKE 1 TABLET BY MOUTH DAILY   busPIRone 15 MG tablet Commonly known as: BUSPAR Take 1 tablet (15 mg total) by mouth 2 (two) times daily.   diltiazem 180 MG 24 hr capsule Commonly known as: CARDIZEM CD Take 1 capsule (180 mg total) by mouth daily.   fluticasone 50 MCG/ACT nasal spray Commonly known as: FLONASE Place 2 sprays into both nostrils at bedtime as needed for allergies or rhinitis.   levETIRAcetam 500 MG tablet Commonly known as: KEPPRA TAKE 1 TABLET BY MOUTH TWICE  DAILY   nabumetone 500 MG tablet Commonly known as: RELAFEN TAKE 1 TABLET BY MOUTH  DAILY   omeprazole 40 MG capsule Commonly known as: PRILOSEC TAKE 1 CAPSULE BY MOUTH DAILY   PARoxetine 20 MG tablet Commonly known as: PAXIL Take 2 tablets (40 mg total) by mouth daily.   pregabalin 75 MG capsule Commonly known as: Lyrica Take 1 capsule (75 mg total) by mouth 3 (three) times daily.   propranolol ER 80 MG 24 hr capsule Commonly known as: Inderal LA Take 1 capsule (80 mg total) by mouth at bedtime.   tamsulosin 0.4 MG Caps capsule Commonly known as: FLOMAX Take 2 capsules (0.8 mg total) by  mouth daily.   traZODone 50 MG tablet Commonly known as: DESYREL Take 0.5-1 tablets (25-50 mg total) by mouth at bedtime as needed for sleep.         Objective:   BP 111/62   Pulse (!) 56   Ht 5\' 8"  (1.727 m)   Wt 191 lb (86.6 kg)   SpO2 96%   BMI 29.04 kg/m   Wt Readings from Last 3 Encounters:  06/28/23 191 lb (86.6 kg)  02/28/23 188 lb (85.3 kg)  11/29/22 188 lb (85.3 kg)    Physical Exam Vitals and nursing note reviewed.  Constitutional:      General: He is not in acute distress.    Appearance: He is well-developed. He is not diaphoretic.  Eyes:     General: No scleral icterus.    Conjunctiva/sclera: Conjunctivae normal.  Neck:     Thyroid: No thyromegaly.  Cardiovascular:     Rate and Rhythm: Normal rate and regular rhythm.     Heart sounds: Normal heart sounds. No murmur heard. Pulmonary:     Effort: Pulmonary effort is normal. No respiratory distress.     Breath sounds: Normal breath sounds. No wheezing.  Musculoskeletal:        General: Normal range of motion.     Cervical back: Neck supple.  Lymphadenopathy:     Cervical: No cervical adenopathy.  Skin:    General: Skin is warm and dry.     Findings: No rash.  Neurological:     Mental Status: He is alert and oriented to person, place, and time.     Coordination: Coordination normal.  Psychiatric:        Behavior: Behavior normal.       Assessment & Plan:   Problem List Items Addressed This Visit       Cardiovascular and Mediastinum   HTN (hypertension) (Chronic)   Relevant Orders   CBC with Differential/Platelet   CMP14+EGFR   Lipid panel   Bayer DCA Hb A1c Waived     Digestive   GERD (gastroesophageal reflux disease) (Chronic)     Genitourinary   BPH (benign prostatic hyperplasia) (Chronic)   Relevant Orders   PSA, total and free     Other   HLD (hyperlipidemia) (Chronic)   Prediabetes - Primary (Chronic)   Relevant Orders   CBC with Differential/Platelet   CMP14+EGFR    Lipid panel   Bayer DCA Hb A1c Waived  Blood pressure looks great today.  Everything else looks good as well.  A1c is 6.1  Follow up plan: Return in about 3 months (around 09/28/2023), or if symptoms worsen or fail to improve, for Prediabetes hypertension and cholesterol.  Counseling provided for all of the vaccine components Orders Placed This Encounter  Procedures   CBC with Differential/Platelet   CMP14+EGFR   Lipid panel   Bayer DCA Hb A1c Waived   PSA, total and free  Arville Care, MD [facility] 06/28/2023, 4:28 PM

## 2023-06-29 LAB — CMP14+EGFR
ALT: 13 IU/L (ref 0–44)
AST: 10 IU/L (ref 0–40)
Albumin: 4.1 g/dL (ref 3.8–4.8)
Alkaline Phosphatase: 137 IU/L — ABNORMAL HIGH (ref 44–121)
BUN/Creatinine Ratio: 12 (ref 10–24)
BUN: 14 mg/dL (ref 8–27)
Bilirubin Total: 0.6 mg/dL (ref 0.0–1.2)
CO2: 24 mmol/L (ref 20–29)
Calcium: 9.4 mg/dL (ref 8.6–10.2)
Chloride: 102 mmol/L (ref 96–106)
Creatinine, Ser: 1.14 mg/dL (ref 0.76–1.27)
Globulin, Total: 2.6 g/dL (ref 1.5–4.5)
Glucose: 101 mg/dL — ABNORMAL HIGH (ref 70–99)
Potassium: 4.5 mmol/L (ref 3.5–5.2)
Sodium: 140 mmol/L (ref 134–144)
Total Protein: 6.7 g/dL (ref 6.0–8.5)
eGFR: 67 mL/min/{1.73_m2} (ref >59)

## 2023-06-29 LAB — CBC WITH DIFFERENTIAL/PLATELET
Basophils Absolute: 0.1 10*3/uL (ref 0.0–0.2)
Basos: 1 %
EOS (ABSOLUTE): 0.4 10*3/uL (ref 0.0–0.4)
Eos: 5 %
Hematocrit: 43.8 % (ref 37.5–51.0)
Hemoglobin: 14.5 g/dL (ref 13.0–17.7)
Immature Grans (Abs): 0 10*3/uL (ref 0.0–0.1)
Immature Granulocytes: 0 %
Lymphocytes Absolute: 1.5 10*3/uL (ref 0.7–3.1)
Lymphs: 18 %
MCH: 30 pg (ref 26.6–33.0)
MCHC: 33.1 g/dL (ref 31.5–35.7)
MCV: 91 fL (ref 79–97)
Monocytes Absolute: 0.6 10*3/uL (ref 0.1–0.9)
Monocytes: 7 %
Neutrophils Absolute: 5.8 10*3/uL (ref 1.4–7.0)
Neutrophils: 69 %
Platelets: 178 10*3/uL (ref 150–450)
RBC: 4.84 x10E6/uL (ref 4.14–5.80)
RDW: 13.9 % (ref 11.6–15.4)
WBC: 8.3 10*3/uL (ref 3.4–10.8)

## 2023-06-29 LAB — LIPID PANEL
Chol/HDL Ratio: 4.2 ratio (ref 0.0–5.0)
Cholesterol, Total: 171 mg/dL (ref 100–199)
HDL: 41 mg/dL (ref >39)
LDL Chol Calc (NIH): 93 mg/dL (ref 0–99)
Triglycerides: 220 mg/dL — ABNORMAL HIGH (ref 0–149)
VLDL Cholesterol Cal: 37 mg/dL (ref 5–40)

## 2023-06-29 LAB — PSA, TOTAL AND FREE
PSA, Free Pct: 28.6 %
PSA, Free: 1.06 ng/mL
Prostate Specific Ag, Serum: 3.7 ng/mL (ref 0.0–4.0)

## 2023-06-29 LAB — BAYER DCA HB A1C WAIVED: HB A1C (BAYER DCA - WAIVED): 6.1 % — ABNORMAL HIGH (ref 4.8–5.6)

## 2023-06-29 NOTE — Addendum Note (Signed)
Addended by: Dorene Sorrow on: 06/29/2023 10:35 AM   Modules accepted: Orders

## 2023-07-18 ENCOUNTER — Other Ambulatory Visit: Payer: Self-pay | Admitting: Family Medicine

## 2023-07-18 DIAGNOSIS — G250 Essential tremor: Secondary | ICD-10-CM

## 2023-07-19 ENCOUNTER — Other Ambulatory Visit: Payer: Self-pay | Admitting: Neurology

## 2023-07-20 ENCOUNTER — Other Ambulatory Visit: Payer: Self-pay | Admitting: Family Medicine

## 2023-07-20 DIAGNOSIS — I10 Essential (primary) hypertension: Secondary | ICD-10-CM

## 2023-08-03 ENCOUNTER — Other Ambulatory Visit: Payer: Self-pay | Admitting: Family Medicine

## 2023-08-03 DIAGNOSIS — N400 Enlarged prostate without lower urinary tract symptoms: Secondary | ICD-10-CM

## 2023-08-03 DIAGNOSIS — E7800 Pure hypercholesterolemia, unspecified: Secondary | ICD-10-CM

## 2023-08-16 ENCOUNTER — Other Ambulatory Visit: Payer: Self-pay | Admitting: Family Medicine

## 2023-08-16 DIAGNOSIS — K219 Gastro-esophageal reflux disease without esophagitis: Secondary | ICD-10-CM

## 2023-10-01 ENCOUNTER — Encounter: Payer: Self-pay | Admitting: Family Medicine

## 2023-10-01 ENCOUNTER — Ambulatory Visit (INDEPENDENT_AMBULATORY_CARE_PROVIDER_SITE_OTHER): Payer: Medicare Other | Admitting: Family Medicine

## 2023-10-01 VITALS — BP 137/76 | HR 54 | Ht 68.0 in | Wt 192.0 lb

## 2023-10-01 DIAGNOSIS — I10 Essential (primary) hypertension: Secondary | ICD-10-CM | POA: Diagnosis not present

## 2023-10-01 DIAGNOSIS — N400 Enlarged prostate without lower urinary tract symptoms: Secondary | ICD-10-CM

## 2023-10-01 DIAGNOSIS — F419 Anxiety disorder, unspecified: Secondary | ICD-10-CM | POA: Diagnosis not present

## 2023-10-01 DIAGNOSIS — Z23 Encounter for immunization: Secondary | ICD-10-CM

## 2023-10-01 DIAGNOSIS — J301 Allergic rhinitis due to pollen: Secondary | ICD-10-CM | POA: Diagnosis not present

## 2023-10-01 DIAGNOSIS — R7303 Prediabetes: Secondary | ICD-10-CM | POA: Diagnosis not present

## 2023-10-01 DIAGNOSIS — G250 Essential tremor: Secondary | ICD-10-CM

## 2023-10-01 DIAGNOSIS — E7800 Pure hypercholesterolemia, unspecified: Secondary | ICD-10-CM

## 2023-10-01 LAB — BAYER DCA HB A1C WAIVED: HB A1C (BAYER DCA - WAIVED): 6 % — ABNORMAL HIGH (ref 4.8–5.6)

## 2023-10-01 MED ORDER — BUSPIRONE HCL 15 MG PO TABS: 15.0000 mg | ORAL_TABLET | Freq: Two times a day (BID) | ORAL | 3 refills | Status: DC

## 2023-10-01 MED ORDER — PROPRANOLOL HCL ER 80 MG PO CP24
80.0000 mg | ORAL_CAPSULE | Freq: Every day | ORAL | 3 refills | Status: DC
Start: 2023-10-01 — End: 2024-07-11

## 2023-10-01 MED ORDER — ATORVASTATIN CALCIUM 40 MG PO TABS
40.0000 mg | ORAL_TABLET | Freq: Every day | ORAL | 3 refills | Status: DC
Start: 2023-10-01 — End: 2024-07-11

## 2023-10-01 MED ORDER — TAMSULOSIN HCL 0.4 MG PO CAPS
0.8000 mg | ORAL_CAPSULE | Freq: Every day | ORAL | 3 refills | Status: DC
Start: 2023-10-01 — End: 2024-07-11

## 2023-10-01 MED ORDER — DILTIAZEM HCL ER COATED BEADS 180 MG PO CP24
180.0000 mg | ORAL_CAPSULE | Freq: Every day | ORAL | 3 refills | Status: DC
Start: 2023-10-01 — End: 2024-07-11

## 2023-10-01 MED ORDER — BENAZEPRIL HCL 40 MG PO TABS
40.0000 mg | ORAL_TABLET | Freq: Every day | ORAL | 3 refills | Status: DC
Start: 2023-10-01 — End: 2024-07-11

## 2023-10-01 MED ORDER — ALLOPURINOL 300 MG PO TABS: 300.0000 mg | ORAL_TABLET | Freq: Every day | ORAL | 3 refills | Status: DC

## 2023-10-01 MED ORDER — FLUTICASONE PROPIONATE 50 MCG/ACT NA SUSP
2.0000 | Freq: Every day | NASAL | 5 refills | Status: DC
Start: 2023-10-01 — End: 2024-07-11

## 2023-10-01 MED ORDER — DESVENLAFAXINE SUCCINATE ER 50 MG PO TB24: 50.0000 mg | ORAL_TABLET | Freq: Every day | ORAL | 1 refills | Status: DC

## 2023-10-01 NOTE — Addendum Note (Signed)
Addended by: Dorene Sorrow on: 10/01/2023 04:17 PM   Modules accepted: Orders

## 2023-10-01 NOTE — Progress Notes (Signed)
BP 137/76   Pulse (!) 54   Ht 5\' 8"  (1.727 m)   Wt 192 lb (87.1 kg)   SpO2 96%   BMI 29.19 kg/m    Subjective:   Patient ID: Jonathan Love, male    DOB: April 05, 1946, 77 y.o.   MRN: [id number]  HPI: Jonathan Love is a 77 y.o. male presenting on 10/01/2023 for Medical Management of Chronic Issues, Hypertension, and Prediabetes   HPI Prediabetes Patient comes in today for recheck of his diabetes. Patient has been currently taking no medicine, diet control, A1c is 6.0 today. Patient is currently on an ACE inhibitor/ARB. Patient has not seen an ophthalmologist this year. Patient denies any new issues with their feet. The symptom started onset as an adult hypertension and hyperlipidemia ARE RELATED TO DM   Hypertension Patient is currently on benazepril and diltiazem and propranolol, and their blood pressure today is 137/76. Patient denies any lightheadedness or dizziness. Patient denies headaches, blurred vision, chest pains, shortness of breath, or weakness. Denies any side effects from medication and is content with current medication.   Hyperlipidemia Patient is coming in for recheck of his hyperlipidemia. The patient is currently taking atorvastatin. They deny any issues with myalgias or history of liver damage from it. They deny any focal numbness or weakness or chest pain.   Anxiety Patient is coming in today for recheck for anxiety and is currently taking the Paxil and really says it does not help that much.  He says nothing is really helped significantly and has tried different things in the past including alprazolam.  He says the only thing that really helped consistently in the past with vodka and he does not really drink anymore.    10/01/2023    2:26 PM 10/01/2023    2:25 PM 06/28/2023    4:04 PM 02/28/2023    1:04 PM 11/29/2022    2:11 PM  Depression screen PHQ 2/9  Decreased Interest  0 0 0 0  Down, Depressed, Hopeless  0 0 0 0  PHQ - 2 Score  0 0 0 0  Altered sleeping 3   3 2    Tired, decreased energy 2  2 2    Change in appetite 0  0 1   Feeling bad or failure about yourself  0  0 0   Trouble concentrating 0  1 1   Moving slowly or fidgety/restless 0  1 0   Suicidal thoughts 0  0 0   PHQ-9 Score   7 6   Difficult doing work/chores Somewhat difficult  Somewhat difficult Not difficult at all      Relevant past medical, surgical, family and social history reviewed and updated as indicated. Interim medical history since our last visit reviewed. Allergies and medications reviewed and updated.  Review of Systems  Constitutional:  Negative for chills and fever.  HENT:  Positive for congestion.   Eyes:  Negative for discharge.  Respiratory:  Positive for cough (Chronic). Negative for shortness of breath and wheezing.   Cardiovascular:  Negative for chest pain and leg swelling.  Musculoskeletal:  Negative for back pain and gait problem.  Skin:  Negative for rash.  All other systems reviewed and are negative.   Per HPI unless specifically indicated above   Allergies as of 10/01/2023       Reactions   Penicillins Other (See Comments)   Seizures        Medication List  Accurate as of October 01, 2023  3:03 PM. If you have any questions, ask your nurse or doctor.          STOP taking these medications    PARoxetine 20 MG tablet Commonly known as: PAXIL Stopped by: Elige Radon Pati Thinnes       TAKE these medications    allopurinol 300 MG tablet Commonly known as: ZYLOPRIM Take 1 tablet (300 mg total) by mouth daily.   atorvastatin 40 MG tablet Commonly known as: LIPITOR Take 1 tablet (40 mg total) by mouth daily.   benazepril 40 MG tablet Commonly known as: LOTENSIN Take 1 tablet (40 mg total) by mouth daily.   busPIRone 15 MG tablet Commonly known as: BUSPAR Take 1 tablet (15 mg total) by mouth 2 (two) times daily.   desvenlafaxine 50 MG 24 hr tablet Commonly known as: Pristiq Take 1 tablet (50 mg total) by mouth  daily. Started by: Elige Radon Maxene Byington   diltiazem 180 MG 24 hr capsule Commonly known as: CARDIZEM CD Take 1 capsule (180 mg total) by mouth daily.   fluticasone 50 MCG/ACT nasal spray Commonly known as: FLONASE Place 2 sprays into both nostrils daily. What changed: See the new instructions. Changed by: Elige Radon Shawntavia Saunders   levETIRAcetam 500 MG tablet Commonly known as: KEPPRA TAKE 1 TABLET BY MOUTH TWICE  DAILY   nabumetone 500 MG tablet Commonly known as: RELAFEN TAKE 1 TABLET BY MOUTH DAILY   omeprazole 40 MG capsule Commonly known as: PRILOSEC TAKE 1 CAPSULE BY MOUTH DAILY   pregabalin 75 MG capsule Commonly known as: LYRICA TAKE 1 CAPSULE BY MOUTH 3 TIMES  DAILY   propranolol ER 80 MG 24 hr capsule Commonly known as: INDERAL LA Take 1 capsule (80 mg total) by mouth at bedtime.   tamsulosin 0.4 MG Caps capsule Commonly known as: FLOMAX Take 2 capsules (0.8 mg total) by mouth daily.   traZODone 50 MG tablet Commonly known as: DESYREL Take 0.5-1 tablets (25-50 mg total) by mouth at bedtime as needed for sleep.         Objective:   BP 137/76   Pulse (!) 54   Ht 5\' 8"  (1.727 m)   Wt 192 lb (87.1 kg)   SpO2 96%   BMI 29.19 kg/m   Wt Readings from Last 3 Encounters:  10/01/23 192 lb (87.1 kg)  06/28/23 191 lb (86.6 kg)  02/28/23 188 lb (85.3 kg)    Physical Exam Vitals and nursing note reviewed.  Constitutional:      General: He is not in acute distress.    Appearance: He is well-developed. He is not diaphoretic.  Eyes:     General: No scleral icterus.    Conjunctiva/sclera: Conjunctivae normal.  Neck:     Thyroid: No thyromegaly.  Cardiovascular:     Rate and Rhythm: Normal rate and regular rhythm.     Heart sounds: Normal heart sounds. No murmur heard. Pulmonary:     Effort: Pulmonary effort is normal. No respiratory distress.     Breath sounds: Normal breath sounds. No wheezing.  Musculoskeletal:        General: No swelling. Normal range  of motion.     Cervical back: Neck supple.  Lymphadenopathy:     Cervical: No cervical adenopathy.  Skin:    General: Skin is warm and dry.     Findings: No rash.  Neurological:     Mental Status: He is alert and oriented to person, place, and time.  Coordination: Coordination normal.  Psychiatric:        Behavior: Behavior normal.       Assessment & Plan:   Problem List Items Addressed This Visit       Cardiovascular and Mediastinum   HTN (hypertension) (Chronic)   Relevant Medications   atorvastatin (LIPITOR) 40 MG tablet   benazepril (LOTENSIN) 40 MG tablet   diltiazem (CARDIZEM CD) 180 MG 24 hr capsule   propranolol ER (INDERAL LA) 80 MG 24 hr capsule     Genitourinary   BPH (benign prostatic hyperplasia) (Chronic)   Relevant Medications   tamsulosin (FLOMAX) 0.4 MG CAPS capsule     Other   HLD (hyperlipidemia) (Chronic)   Relevant Medications   atorvastatin (LIPITOR) 40 MG tablet   benazepril (LOTENSIN) 40 MG tablet   diltiazem (CARDIZEM CD) 180 MG 24 hr capsule   propranolol ER (INDERAL LA) 80 MG 24 hr capsule   Prediabetes - Primary (Chronic)   Relevant Orders   Bayer DCA Hb A1c Waived   Anxiety (Chronic)   Relevant Medications   busPIRone (BUSPAR) 15 MG tablet   desvenlafaxine (PRISTIQ) 50 MG 24 hr tablet   Other Visit Diagnoses     Benign essential tremor       Relevant Medications   propranolol ER (INDERAL LA) 80 MG 24 hr capsule   Seasonal allergic rhinitis due to pollen       Relevant Medications   fluticasone (FLONASE) 50 MCG/ACT nasal spray       Recommend for patient to take only 1 tablet of Paxil daily for a week and then stop taking the Paxil and start taking the Pristiq.  Sent Pristiq to the pharmacy.  A1c was good at 6.0.  Continue with diet.  Follow up plan: Return in about 3 months (around 12/30/2023), or if symptoms worsen or fail to improve, for Prediabetes and anxiety and hypertension.  Counseling provided for all of the  vaccine components Orders Placed This Encounter  Procedures   Bayer DCA Hb A1c Waived    Arville Care, MD [facility] 10/01/2023, 3:03 PM

## 2023-11-19 ENCOUNTER — Ambulatory Visit: Payer: Medicare Other | Admitting: Neurology

## 2023-11-19 ENCOUNTER — Encounter: Payer: Self-pay | Admitting: Neurology

## 2023-11-19 VITALS — BP 116/72 | Ht 68.0 in | Wt 191.0 lb

## 2023-11-19 DIAGNOSIS — G40909 Epilepsy, unspecified, not intractable, without status epilepticus: Secondary | ICD-10-CM

## 2023-11-19 DIAGNOSIS — G629 Polyneuropathy, unspecified: Secondary | ICD-10-CM | POA: Diagnosis not present

## 2023-11-19 MED ORDER — DULOXETINE HCL 60 MG PO CPEP: 60.0000 mg | ORAL_CAPSULE | Freq: Every day | ORAL | 0 refills | Status: DC

## 2023-11-19 MED ORDER — LEVETIRACETAM 500 MG PO TABS: 500.0000 mg | ORAL_TABLET | Freq: Two times a day (BID) | ORAL | 3 refills | Status: DC

## 2023-11-19 MED ORDER — PREGABALIN 75 MG PO CAPS: 75.0000 mg | ORAL_CAPSULE | Freq: Three times a day (TID) | ORAL | 3 refills | Status: DC

## 2023-11-19 NOTE — Progress Notes (Signed)
[facility]  PATIENT: Jonathan Love DOB: 11-27-1945  REQUESTING CLINICIAN: Dettinger, Elige Radon, MD HISTORY FROM: Patient and spouse  REASON FOR VISIT: Epilepsy follow up   HISTORICAL  CHIEF COMPLAINT:  Chief Complaint  Patient presents with   Follow-up    Rm 12, sz f/u, no sz noted and denies SI/HI, still complains of foot pain   INTERVAL HISTORY 11/19/2023:  Patient presents today for follow-up, last visit was a year ago. Since then, he has been doing well, denies any seizures or seizure activity.  He is compliant with the Keppra very well, denies any side effect from the medication.  His main problem is his neuropathic pain.  Currently he is on pregabalin 75 mg 3 times daily but still experience pain.  PCP was put him on desvenlafaxine, has not seen any improvement.   INTERVAL HISTORY 11/20/2022:  Patient presents today for follow-up, last visit was in July, since then he has not had any additional seizures.  He is tolerating the Keppra very well.  He reports that the pregabalin has helped him tremendously with the neuropathic pain.  His main concern is his anxiety.  His primary care doctor discontinue his clonazepam and put him on buspirone and paroxetine.  He reports this combination is not helpful for him.  Would like to get something better.   HISTORY OF PRESENT ILLNESS:  [name] with PMHx of heart disease, hypertension, hyperlipidemia and peripheral neuropathy who is presenting after new onset of seizure. Wife reports on July 9, she found patient in the bathroom unresponsive after hearing a thud and a faint crying noise. EMS was called and patient was rushed in the hospital. While in the ED, he had a second generalized convulsion requiring Ativan and Keppra load. His MRI brain was negative for acute abnormalities and his EEG was within normal limits. This was the patient first lifetime seizure and after discussion, plan was to start Keppra 500 mg  BID. Since started the medication, he denies any side effects other than he is sleeping better at night.  For his peripheral neuropathy, his gabapentin was increased to 300 mg TID but reports no improvement. He reports trying Pregabalin in the past which some improvement of the pain.   Handedness: Right handed   Onset: 05/07/22  Seizure Type: Generalized convulsion   Current frequency: Only once   Any injuries from seizures: Bruises   Seizure risk factors: None reported   Previous ASMs: None   Currenty ASMs: Levetiracetam 500 mg BID   ASMs side effects: none   Brain Images: normal Brain MRI   Previous EEGs: Normal EEG    OTHER MEDICAL CONDITIONS: Hypertension, hyperlipidemia and heart disease, peripheral neuropathy  REVIEW OF SYSTEMS: Full 14 system review of systems performed and negative with exception of: as noted in the HPI   ALLERGIES: Allergies  Allergen Reactions   Penicillins Other (See Comments)    Seizures    HOME MEDICATIONS: Outpatient Medications Prior to Visit  Medication Sig Dispense Refill   allopurinol (ZYLOPRIM) 300 MG tablet Take 1 tablet (300 mg total) by mouth daily. 100 tablet 3   atorvastatin (LIPITOR) 40 MG tablet Take 1 tablet (40 mg total) by mouth daily. 100 tablet 3   benazepril (LOTENSIN) 40 MG tablet Take 1 tablet (40 mg total) by mouth daily. 100 tablet 3   busPIRone (BUSPAR) 15 MG tablet Take 1 tablet (15 mg total) by mouth 2 (two) times daily. 180 tablet 3  desvenlafaxine (PRISTIQ) 50 MG 24 hr tablet Take 1 tablet (50 mg total) by mouth daily. 90 tablet 1   diltiazem (CARDIZEM CD) 180 MG 24 hr capsule Take 1 capsule (180 mg total) by mouth daily. 100 capsule 3   fluticasone (FLONASE) 50 MCG/ACT nasal spray Place 2 sprays into both nostrils daily. 16 g 5   nabumetone (RELAFEN) 500 MG tablet TAKE 1 TABLET BY MOUTH DAILY 100 tablet 2   omeprazole (PRILOSEC) 40 MG capsule TAKE 1 CAPSULE BY MOUTH DAILY 100 capsule 2   propranolol ER  (INDERAL LA) 80 MG 24 hr capsule Take 1 capsule (80 mg total) by mouth at bedtime. 100 capsule 3   tamsulosin (FLOMAX) 0.4 MG CAPS capsule Take 2 capsules (0.8 mg total) by mouth daily. 200 capsule 3   traZODone (DESYREL) 50 MG tablet Take 0.5-1 tablets (25-50 mg total) by mouth at bedtime as needed for sleep. 30 tablet 3   levETIRAcetam (KEPPRA) 500 MG tablet TAKE 1 TABLET BY MOUTH TWICE  DAILY 200 tablet 2   pregabalin (LYRICA) 75 MG capsule TAKE 1 CAPSULE BY MOUTH 3 TIMES  DAILY 180 capsule 1   No facility-administered medications prior to visit.    PAST MEDICAL HISTORY: Past Medical History:  Diagnosis Date   Anxiety    Cataract    CTS (carpal tunnel syndrome)    left   Diverticulitis    GERD (gastroesophageal reflux disease)    Gout    H/O agent Orange exposure    Hyperlipidemia    Hypertension    Melanoma (HCC) 04/2020   right cheek   Migraine    Neuropathy    OAB (overactive bladder)    Prediabetes     PAST SURGICAL HISTORY: Past Surgical History:  Procedure Laterality Date   EYE SURGERY Left 08/24/2016   retinal - dr Molli Hazard = laser eye   MELANOMA EXCISION Right    cheek   right lateral epicondylitis     vocal cord nodule      FAMILY HISTORY: Family History  Problem Relation Age of Onset   Stomach cancer Father    Lung cancer Father    Heart disease Mother     SOCIAL HISTORY: Social History   Socioeconomic History   Marital status: Married    Spouse name: Not on file   Number of children: Not on file   Years of education: Not on file   Highest education level: Not on file  Occupational History   Not on file  Tobacco Use   Smoking status: Never   Smokeless tobacco: Never  Vaping Use   Vaping status: Never Used  Substance and Sexual Activity   Alcohol use: No   Drug use: No   Sexual activity: Not on file  Other Topics Concern   Not on file  Social History Narrative   Right handed   Caffiene- none   Married   Retired from [employer]    Social Drivers of Health   Financial Resource Strain: Medium Risk (06/27/2021)   Received from [facility], [facility]   Overall Financial Resource Strain (CARDIA)    Difficulty of Paying Living Expenses: Somewhat hard  Food Insecurity: No Food Insecurity (06/27/2021)   Received from [facility], [facility]   Hunger Vital Sign    Worried About Running Out of Food in the Last Year: Never true    Ran Out of Food in the Last Year: Never true  Transportation Needs: No Transportation Needs (06/27/2021)  Received from [facility], [facility]   [facility] - Transportation    Lack of Transportation (Medical): No    Lack of Transportation (Non-Medical): No  Physical Activity: Insufficiently Active (06/27/2021)   Received from [facility], [facility]   Exercise Vital Sign    Days of Exercise per Week: 4 days    Minutes of Exercise per Session: 20 min  Stress: Stress Concern Present (06/27/2021)   Received from [facility], [facility] - Occupational Stress Questionnaire    Feeling of Stress : To some extent  Social Connections: Unknown (03/14/2022)   Received from [facility], [facility]   Social Network    Social Network: Not on file  Intimate Partner Violence: Unknown (02/03/2022)   Received from [facility], [facility]   HITS    Physically Hurt: Not on file    Insult or Talk Down To: Not on file    Threaten Physical Harm: Not on file    Scream or Curse: Not on file    PHYSICAL EXAM  GENERAL EXAM/CONSTITUTIONAL: Vitals:  Vitals:   11/19/23 1345  BP: 116/72  Weight: 191 lb (86.6 kg)  Height: 5\' 8"  (1.727 m)   Body mass index is 29.04 kg/m. Wt Readings from Last 3 Encounters:  11/19/23 191 lb (86.6 kg)  10/01/23 192 lb (87.1 kg)  06/28/23 191 lb (86.6 kg)   Patient is in no distress; well developed, nourished and groomed; neck is supple  MUSCULOSKELETAL: Gait, strength, tone, movements noted in  Neurologic exam below  NEUROLOGIC: MENTAL STATUS:      No data to display         awake, alert, oriented to person, place and time recent and remote memory intact normal attention and concentration language fluent, comprehension intact, naming intact fund of knowledge appropriate  CRANIAL NERVE:  2nd, 3rd, 4th, 6th - visual fields full to confrontation, extraocular muscles intact, no nystagmus 5th - facial sensation symmetric 7th - facial strength symmetric 8th - hearing intact 9th - palate elevates symmetrically, uvula midline 11th - shoulder shrug symmetric 12th - tongue protrusion midline  MOTOR:  normal bulk and tone, full strength in the BUE, BLE  SENSORY:  normal and symmetric to light touch  COORDINATION:  finger-nose-finger, fine finger movements normal  REFLEXES:  deep tendon reflexes present and symmetric  GAIT/STATION:  Normal, do not use a cane     DIAGNOSTIC DATA (LABS, IMAGING, TESTING) - I reviewed patient records, labs, notes, testing and imaging myself where available.  Lab Results  Component Value Date   WBC 8.3 06/28/2023   HGB 14.5 06/28/2023   HCT 43.8 06/28/2023   MCV 91 06/28/2023   PLT 178 06/28/2023      Component Value Date/Time   NA 140 06/28/2023 1606   K 4.5 06/28/2023 1606   CL 102 06/28/2023 1606   CO2 24 06/28/2023 1606   GLUCOSE 101 (H) 06/28/2023 1606   GLUCOSE 107 (H) 05/08/2022 0333   BUN 14 06/28/2023 1606   CREATININE 1.14 06/28/2023 1606   CREATININE 1.21 04/09/2013 1308   CALCIUM 9.4 06/28/2023 1606   PROT 6.7 06/28/2023 1606   ALBUMIN 4.1 06/28/2023 1606   AST 10 06/28/2023 1606   ALT 13 06/28/2023 1606   ALKPHOS 137 (H) 06/28/2023 1606   BILITOT 0.6 06/28/2023 1606   GFRNONAA >60 05/08/2022 0333   GFRNONAA 62 04/09/2013 1308   GFRAA 83 07/09/2020 1349   GFRAA 72 04/09/2013 1308  Lab Results  Component Value Date   CHOL 171 06/28/2023   HDL 41 06/28/2023   LDLCALC 93 06/28/2023   TRIG 220 (H)  06/28/2023   Lab Results  Component Value Date   HGBA1C 6.0 (H) 10/01/2023   No results found for: "VITAMINB12" Lab Results  Component Value Date   TSH 3.726 05/07/2022    MRI Brain 05/08/22 Left scalp edema/hematoma. No evidence of underlying skull fracture. No traumatic intracranial finding. Mild to low moderate chronic small-vessel ischemic changes of the cerebral hemispheric white matter, most prominent in the left frontal region. Age related brain volume loss. No finding to specifically explain seizure  EEG 05/08/22 This study is within normal limits. No seizures or epileptiform discharges were seen throughout the recording    ASSESSMENT AND PLAN  78 y.o. year old male  with history of hypertension, hyperlipidemia, CAD and peripheral neuropathy who is presenting for follow up for his seizures.  In terms of the seizures, he is doing well, denies any seizures since last visit.  He is compliant with the Keppra 500 mg twice daily and no side effects.   In terms of neuropathy, will continue him on Pregabalin 75 mg three times daily. I will also give him a trial of Duloxetine. He is on Desvenlafaxine, I ask patient to hold it while taking the Duloxetine to avoid Serotonin syndrome. He will update me in a month. If Duloxetine not helpful, will likely increase his Pregabalin to 150 mg twice daily. I will see him in the office in a year of sooner if worse.    1. Seizure disorder (HCC)   2. Peripheral polyneuropathy      Patient Instructions  Continue with Keppra 500 mg twice daily, refill given Trial of duloxetine for 1 month.  I asked patient to hold his Desvenlafaxine to avoid serotonin syndrome while taking the duloxetine. If duloxetine not helpful, we will likely increase the pregabalin to 150 mg twice daily Please contact me if symptoms do get worse Follow-up in a year or sooner if worse.    Per [employer] statutes, patients with seizures are not allowed to drive  until they have been seizure-free for six months.  Other recommendations include using caution when using heavy equipment or power tools. Avoid working on ladders or at heights. Take showers instead of baths.  Do not swim alone.  Ensure the water temperature is not too high on the home water heater. Do not go swimming alone. Do not lock yourself in a room alone (i.e. bathroom). When caring for infants or small children, sit down when holding, feeding, or changing them to minimize risk of injury to the child in the event you have a seizure. Maintain good sleep hygiene. Avoid alcohol.  Also recommend adequate sleep, hydration, good diet and minimize stress.   During the Seizure  - First, ensure adequate ventilation and place patients on the floor on their left side  Loosen clothing around the neck and ensure the airway is patent. If the patient is clenching the teeth, do not force the mouth open with any object as this can cause severe damage - Remove all items from the surrounding that can be hazardous. The patient may be oblivious to what's happening and may not even know what he or she is doing. If the patient is confused and wandering, either gently guide him/her away and block access to outside areas - Reassure the individual and be comforting - Call 911. In most cases, the  seizure ends before EMS arrives. However, there are cases when seizures may last over 3 to 5 minutes. Or the individual may have developed breathing difficulties or severe injuries. If a pregnant patient or a person with diabetes develops a seizure, it is prudent to call an ambulance. - Finally, if the patient does not regain full consciousness, then call EMS. Most patients will remain confused for about 45 to 90 minutes after a seizure, so you must use judgment in calling for help. - Avoid restraints but make sure the patient is in a bed with padded side rails - Place the individual in a lateral position with the neck slightly  flexed; this will help the saliva drain from the mouth and prevent the tongue from falling backward - Remove all nearby furniture and other hazards from the area - Provide verbal assurance as the individual is regaining consciousness - Provide the patient with privacy if possible - Call for help and start treatment as ordered by the caregiver   After the Seizure (Postictal Stage)  After a seizure, most patients experience confusion, fatigue, muscle pain and/or a headache. Thus, one should permit the individual to sleep. For the next few days, reassurance is essential. Being calm and helping reorient the person is also of importance.  Most seizures are painless and end spontaneously. Seizures are not harmful to others but can lead to complications such as stress on the lungs, brain and the heart. Individuals with prior lung problems may develop labored breathing and respiratory distress.     No orders of the defined types were placed in this encounter.   Meds ordered this encounter  Medications   DULoxetine (CYMBALTA) 60 MG capsule    Sig: Take 1 capsule (60 mg total) by mouth daily.    Dispense:  30 capsule    Refill:  0   levETIRAcetam (KEPPRA) 500 MG tablet    Sig: Take 1 tablet (500 mg total) by mouth 2 (two) times daily.    Dispense:  200 tablet    Refill:  3    Please send a replace/new response with 100-Day Supply if appropriate to maximize member benefit. Requesting 1 year supply.   pregabalin (LYRICA) 75 MG capsule    Sig: Take 1 capsule (75 mg total) by mouth 3 (three) times daily.    Dispense:  270 capsule    Refill:  3    Return in about 1 year (around 11/18/2024).   Windell Norfolk, MD 11/19/2023, 5:24 PM  [facility] [address] [place], [place] [address] [phone]

## 2023-11-19 NOTE — Patient Instructions (Signed)
Continue with Keppra 500 mg twice daily, refill given Trial of duloxetine for 1 month.  I asked patient to hold his Desvenlafaxine to avoid serotonin syndrome while taking the duloxetine. If duloxetine not helpful, we will likely increase the pregabalin to 150 mg twice daily Please contact me if symptoms do get worse Follow-up in a year or sooner if worse.

## 2024-01-09 ENCOUNTER — Ambulatory Visit: Payer: Medicare Other | Admitting: Family Medicine

## 2024-01-09 ENCOUNTER — Encounter: Payer: Self-pay | Admitting: Family Medicine

## 2024-01-09 VITALS — BP 136/63 | HR 59 | Ht 68.0 in | Wt 192.0 lb

## 2024-01-09 DIAGNOSIS — E7800 Pure hypercholesterolemia, unspecified: Secondary | ICD-10-CM | POA: Diagnosis not present

## 2024-01-09 DIAGNOSIS — I10 Essential (primary) hypertension: Secondary | ICD-10-CM

## 2024-01-09 DIAGNOSIS — R7303 Prediabetes: Secondary | ICD-10-CM | POA: Diagnosis not present

## 2024-01-09 LAB — LIPID PANEL

## 2024-01-09 LAB — BAYER DCA HB A1C WAIVED: HB A1C (BAYER DCA - WAIVED): 5.8 % — ABNORMAL HIGH (ref 4.8–5.6)

## 2024-01-09 NOTE — Progress Notes (Signed)
 BP 136/63   Pulse (!) 59   Ht 5\' 8"  (1.727 m)   Wt 192 lb (87.1 kg)   SpO2 97%   BMI 29.19 kg/m    Subjective:   Patient ID: Jonathan Love, male    DOB: 02-28-1946, 78 y.o.   MRN: [id number]  HPI: Jonathan Love is a 78 y.o. male presenting on 01/09/2024 for Medical Management of Chronic Issues, Hypertension, and Prediabetes   HPI Prediabetes Patient comes in today for recheck of his diabetes. Patient has been currently taking no medicine currently. Patient is currently on an ACE inhibitor/ARB. Patient has not seen an ophthalmologist this year. Patient denies any new issues with their feet. The symptom started onset as an adult hypertension and hyperlipidemia ARE RELATED TO DM   Hypertension Patient is currently on benazepril and diltiazem and propranolol, and their blood pressure today is 136/63. Patient denies any lightheadedness or dizziness. Patient denies headaches, blurred vision, chest pains, shortness of breath, or weakness. Denies any side effects from medication and is content with current medication.   Hyperlipidemia Patient is coming in for recheck of his hyperlipidemia. The patient is currently taking atorvastatin. They deny any issues with myalgias or history of liver damage from it. They deny any focal numbness or weakness or chest pain.   Relevant past medical, surgical, family and social history reviewed and updated as indicated. Interim medical history since our last visit reviewed. Allergies and medications reviewed and updated.  Review of Systems  Constitutional:  Negative for chills and fever.  Respiratory:  Negative for shortness of breath and wheezing.   Cardiovascular:  Negative for chest pain and leg swelling.  Musculoskeletal:  Negative for back pain and gait problem.  Skin:  Negative for rash.  Neurological:  Negative for dizziness, weakness and light-headedness.  All other systems reviewed and are negative.   Per HPI unless specifically indicated  above   Allergies as of 01/09/2024       Reactions   Penicillins Other (See Comments)   Seizures        Medication List        Accurate as of January 09, 2024  1:38 PM. If you have any questions, ask your nurse or doctor.          STOP taking these medications    DULoxetine 60 MG capsule Commonly known as: Cymbalta Stopped by: Elige Radon Maliea Grandmaison       TAKE these medications    allopurinol 300 MG tablet Commonly known as: ZYLOPRIM Take 1 tablet (300 mg total) by mouth daily.   atorvastatin 40 MG tablet Commonly known as: LIPITOR Take 1 tablet (40 mg total) by mouth daily.   benazepril 40 MG tablet Commonly known as: LOTENSIN Take 1 tablet (40 mg total) by mouth daily.   busPIRone 15 MG tablet Commonly known as: BUSPAR Take 1 tablet (15 mg total) by mouth 2 (two) times daily.   desvenlafaxine 50 MG 24 hr tablet Commonly known as: Pristiq Take 1 tablet (50 mg total) by mouth daily.   diltiazem 180 MG 24 hr capsule Commonly known as: CARDIZEM CD Take 1 capsule (180 mg total) by mouth daily.   fluticasone 50 MCG/ACT nasal spray Commonly known as: FLONASE Place 2 sprays into both nostrils daily.   levETIRAcetam 500 MG tablet Commonly known as: KEPPRA Take 1 tablet (500 mg total) by mouth 2 (two) times daily.   nabumetone 500 MG tablet Commonly known as: RELAFEN TAKE 1 TABLET BY  MOUTH DAILY   omeprazole 40 MG capsule Commonly known as: PRILOSEC TAKE 1 CAPSULE BY MOUTH DAILY   pregabalin 75 MG capsule Commonly known as: LYRICA Take 1 capsule (75 mg total) by mouth 3 (three) times daily.   propranolol ER 80 MG 24 hr capsule Commonly known as: INDERAL LA Take 1 capsule (80 mg total) by mouth at bedtime.   tamsulosin 0.4 MG Caps capsule Commonly known as: FLOMAX Take 2 capsules (0.8 mg total) by mouth daily.   traZODone 50 MG tablet Commonly known as: DESYREL Take 0.5-1 tablets (25-50 mg total) by mouth at bedtime as needed for sleep.          Objective:   BP 136/63   Pulse (!) 59   Ht 5\' 8"  (1.727 m)   Wt 192 lb (87.1 kg)   SpO2 97%   BMI 29.19 kg/m   Wt Readings from Last 3 Encounters:  01/09/24 192 lb (87.1 kg)  11/19/23 191 lb (86.6 kg)  10/01/23 192 lb (87.1 kg)    Physical Exam Vitals and nursing note reviewed.  Constitutional:      General: He is not in acute distress.    Appearance: He is well-developed. He is not diaphoretic.  Eyes:     General: No scleral icterus.    Conjunctiva/sclera: Conjunctivae normal.  Neck:     Thyroid: No thyromegaly.  Cardiovascular:     Rate and Rhythm: Normal rate and regular rhythm.     Heart sounds: Normal heart sounds. No murmur heard. Pulmonary:     Effort: Pulmonary effort is normal. No respiratory distress.     Breath sounds: Normal breath sounds. No wheezing.  Musculoskeletal:        General: No swelling. Normal range of motion.     Cervical back: Neck supple.  Lymphadenopathy:     Cervical: No cervical adenopathy.  Skin:    General: Skin is warm and dry.     Findings: No rash.  Neurological:     Mental Status: He is alert and oriented to person, place, and time.     Coordination: Coordination normal.  Psychiatric:        Behavior: Behavior normal.       Assessment & Plan:   Problem List Items Addressed This Visit       Cardiovascular and Mediastinum   HTN (hypertension) (Chronic)   Relevant Orders   CBC with Differential/Platelet   CMP14+EGFR   Lipid panel   Bayer DCA Hb A1c Waived     Other   HLD (hyperlipidemia) (Chronic)   Relevant Orders   CBC with Differential/Platelet   CMP14+EGFR   Lipid panel   Bayer DCA Hb A1c Waived   Prediabetes - Primary (Chronic)   Relevant Orders   CBC with Differential/Platelet   CMP14+EGFR   Lipid panel   Bayer DCA Hb A1c Waived    Blood pressure looks good.  A1c was 5.8 which was good.  Will do blood work today and no other changes for now. Follow up plan: Return in about 3 months (around  04/10/2024), or if symptoms worsen or fail to improve, for Prediabetes and hypertension recheck.  Counseling provided for all of the vaccine components Orders Placed This Encounter  Procedures   CBC with Differential/Platelet   CMP14+EGFR   Lipid panel   Bayer DCA Hb A1c Waived    Arville Care, MD [facility] 01/09/2024, 1:38 PM

## 2024-01-10 ENCOUNTER — Encounter: Payer: Self-pay | Admitting: Family Medicine

## 2024-01-10 LAB — CMP14+EGFR
ALT: 12 IU/L (ref 0–44)
AST: 23 IU/L (ref 0–40)
Albumin: 4.2 g/dL (ref 3.8–4.8)
Alkaline Phosphatase: 171 IU/L — ABNORMAL HIGH (ref 44–121)
BUN/Creatinine Ratio: 12 (ref 10–24)
BUN: 13 mg/dL (ref 8–27)
Bilirubin Total: 0.4 mg/dL (ref 0.0–1.2)
CO2: 21 mmol/L (ref 20–29)
Calcium: 9.1 mg/dL (ref 8.6–10.2)
Chloride: 101 mmol/L (ref 96–106)
Creatinine, Ser: 1.11 mg/dL (ref 0.76–1.27)
Globulin, Total: 2.6 g/dL (ref 1.5–4.5)
Glucose: 128 mg/dL — ABNORMAL HIGH (ref 70–99)
Potassium: 4.9 mmol/L (ref 3.5–5.2)
Sodium: 140 mmol/L (ref 134–144)
Total Protein: 6.8 g/dL (ref 6.0–8.5)
eGFR: 68 mL/min/{1.73_m2} (ref >59)

## 2024-01-10 LAB — CBC WITH DIFFERENTIAL/PLATELET
Basophils Absolute: 0.1 10*3/uL (ref 0.0–0.2)
Basos: 1 %
EOS (ABSOLUTE): 0.5 10*3/uL — ABNORMAL HIGH (ref 0.0–0.4)
Eos: 7 %
Hematocrit: 45.7 % (ref 37.5–51.0)
Hemoglobin: 15.4 g/dL (ref 13.0–17.7)
Immature Grans (Abs): 0 10*3/uL (ref 0.0–0.1)
Immature Granulocytes: 0 %
Lymphocytes Absolute: 1.3 10*3/uL (ref 0.7–3.1)
Lymphs: 18 %
MCH: 30.6 pg (ref 26.6–33.0)
MCHC: 33.7 g/dL (ref 31.5–35.7)
MCV: 91 fL (ref 79–97)
Monocytes Absolute: 0.4 10*3/uL (ref 0.1–0.9)
Monocytes: 6 %
Neutrophils Absolute: 4.7 10*3/uL (ref 1.4–7.0)
Neutrophils: 68 %
Platelets: 153 10*3/uL (ref 150–450)
RBC: 5.04 x10E6/uL (ref 4.14–5.80)
RDW: 14 % (ref 11.6–15.4)
WBC: 6.9 10*3/uL (ref 3.4–10.8)

## 2024-01-10 LAB — LIPID PANEL
Cholesterol, Total: 183 mg/dL (ref 100–199)
HDL: 42 mg/dL (ref >39)
LDL CALC COMMENT:: 4.4 ratio (ref 0.0–5.0)
LDL Chol Calc (NIH): 92 mg/dL (ref 0–99)
Triglycerides: 294 mg/dL — ABNORMAL HIGH (ref 0–149)
VLDL Cholesterol Cal: 49 mg/dL — ABNORMAL HIGH (ref 5–40)

## 2024-04-10 ENCOUNTER — Encounter: Payer: Self-pay | Admitting: Family Medicine

## 2024-04-10 ENCOUNTER — Ambulatory Visit: Admitting: Family Medicine

## 2024-04-10 VITALS — BP 124/60 | HR 57 | Temp 97.8°F | Ht 68.0 in | Wt 194.4 lb

## 2024-04-10 DIAGNOSIS — I10 Essential (primary) hypertension: Secondary | ICD-10-CM

## 2024-04-10 DIAGNOSIS — E7800 Pure hypercholesterolemia, unspecified: Secondary | ICD-10-CM

## 2024-04-10 DIAGNOSIS — R7303 Prediabetes: Secondary | ICD-10-CM | POA: Diagnosis not present

## 2024-04-10 LAB — BAYER DCA HB A1C WAIVED: HB A1C (BAYER DCA - WAIVED): 5.7 % — ABNORMAL HIGH (ref 4.8–5.6)

## 2024-04-10 MED ORDER — DESVENLAFAXINE SUCCINATE ER 50 MG PO TB24: 50.0000 mg | ORAL_TABLET | Freq: Every day | ORAL | 1 refills | Status: DC

## 2024-04-10 NOTE — Progress Notes (Signed)
 BP 124/60   Pulse (!) 57   Temp 97.8 F (36.6 C) (Temporal)   Ht 5' 8 (1.727 m)   Wt 194 lb 6.4 oz (88.2 kg)   SpO2 95%   BMI 29.56 kg/m    Subjective:   Patient ID: Jonathan Love, male    DOB: [birth year], 78 y.o.   MRN: [id number]  HPI: Jonathan Love is a 78 y.o. male presenting on 04/10/2024 for Prediabetes   HPI Hypertension Patient is currently on benazepril  and diltiazem  and propranolol , and their blood pressure today is 124/60. Patient denies any lightheadedness or dizziness. Patient denies headaches, blurred vision, chest pains, shortness of breath, or weakness. Denies any side effects from medication and is content with current medication.   Hyperlipidemia Patient is coming in for recheck of his hyperlipidemia. The patient is currently taking atorvastatin . They deny any issues with myalgias or history of liver damage from it. They deny any focal numbness or weakness or chest pain.   Prediabetes Patient comes in today for recheck of his diabetes. Patient has been currently taking no medicine currently, diet control. Patient is currently on an ACE inhibitor/ARB. Patient has seen an ophthalmologist this year. Patient denies any new issues with their feet, still has some neuropathy but is not really changed. The symptom started onset as an adult neuropathy and hypertension and hyperlipidemia ARE RELATED TO DM   Relevant past medical, surgical, family and social history reviewed and updated as indicated. Interim medical history since our last visit reviewed. Allergies and medications reviewed and updated.  Review of Systems  Constitutional:  Negative for chills and fever.  Eyes:  Negative for visual disturbance.  Respiratory:  Negative for shortness of breath and wheezing.   Cardiovascular:  Negative for chest pain and leg swelling.  Musculoskeletal:  Negative for back pain and gait problem.  Skin:  Negative for rash.  Neurological:  Negative for dizziness, weakness and  light-headedness.  All other systems reviewed and are negative.   Per HPI unless specifically indicated above   Allergies as of 04/10/2024       Reactions   Penicillins Other (See Comments)   Seizures        Medication List        Accurate as of April 10, 2024  2:00 PM. If you have any questions, ask your nurse or doctor.          allopurinol  300 MG tablet Commonly known as: ZYLOPRIM  Take 1 tablet (300 mg total) by mouth daily.   atorvastatin  40 MG tablet Commonly known as: LIPITOR Take 1 tablet (40 mg total) by mouth daily.   benazepril  40 MG tablet Commonly known as: LOTENSIN  Take 1 tablet (40 mg total) by mouth daily.   busPIRone  15 MG tablet Commonly known as: BUSPAR  Take 1 tablet (15 mg total) by mouth 2 (two) times daily.   desvenlafaxine  50 MG 24 hr tablet Commonly known as: Pristiq  Take 1 tablet (50 mg total) by mouth daily.   diltiazem  180 MG 24 hr capsule Commonly known as: CARDIZEM  CD Take 1 capsule (180 mg total) by mouth daily.   fluticasone  50 MCG/ACT nasal spray Commonly known as: FLONASE  Place 2 sprays into both nostrils daily.   levETIRAcetam  500 MG tablet Commonly known as: KEPPRA  Take 1 tablet (500 mg total) by mouth 2 (two) times daily.   nabumetone  500 MG tablet Commonly known as: RELAFEN  TAKE 1 TABLET BY MOUTH DAILY   omeprazole  40 MG capsule Commonly known  as: PRILOSEC TAKE 1 CAPSULE BY MOUTH DAILY   pregabalin  75 MG capsule Commonly known as: LYRICA  Take 1 capsule (75 mg total) by mouth 3 (three) times daily.   propranolol  ER 80 MG 24 hr capsule Commonly known as: INDERAL  LA Take 1 capsule (80 mg total) by mouth at bedtime.   tamsulosin  0.4 MG Caps capsule Commonly known as: FLOMAX  Take 2 capsules (0.8 mg total) by mouth daily.   traZODone  50 MG tablet Commonly known as: DESYREL  Take 0.5-1 tablets (25-50 mg total) by mouth at bedtime as needed for sleep.         Objective:   BP 124/60   Pulse (!) 57    Temp 97.8 F (36.6 C) (Temporal)   Ht 5' 8 (1.727 m)   Wt 194 lb 6.4 oz (88.2 kg)   SpO2 95%   BMI 29.56 kg/m   Wt Readings from Last 3 Encounters:  04/10/24 194 lb 6.4 oz (88.2 kg)  01/09/24 192 lb (87.1 kg)  11/19/23 191 lb (86.6 kg)    Physical Exam Vitals and nursing note reviewed.  Constitutional:      General: He is not in acute distress.    Appearance: He is well-developed. He is not diaphoretic.   Eyes:     General: No scleral icterus.    Conjunctiva/sclera: Conjunctivae normal.   Neck:     Thyroid : No thyromegaly.   Cardiovascular:     Rate and Rhythm: Normal rate and regular rhythm.     Heart sounds: Normal heart sounds. No murmur heard. Pulmonary:     Effort: Pulmonary effort is normal. No respiratory distress.     Breath sounds: Normal breath sounds. No wheezing.   Musculoskeletal:        General: No swelling. Normal range of motion.     Cervical back: Neck supple.  Lymphadenopathy:     Cervical: No cervical adenopathy.   Skin:    General: Skin is warm and dry.     Findings: No rash.   Neurological:     Mental Status: He is alert and oriented to person, place, and time.     Coordination: Coordination normal.   Psychiatric:        Behavior: Behavior normal.       Assessment & Plan:   Problem List Items Addressed This Visit       Cardiovascular and Mediastinum   HTN (hypertension) (Chronic)     Other   HLD (hyperlipidemia) (Chronic)   Prediabetes - Primary (Chronic)   Relevant Orders   Bayer DCA Hb A1c Waived    A1c looks good at 5.7.  Blood pressure everything else looks good today.  No changes Follow up plan: Return in about 3 months (around 07/11/2024), or if symptoms worsen or fail to improve, for Prediabetes and hypertension and hyperlipidemia.  Counseling provided for all of the vaccine components Orders Placed This Encounter  Procedures   Bayer DCA Hb A1c Waived    Jolyne Needs, MD [facility] 04/10/2024, 2:00 PM

## 2024-04-29 ENCOUNTER — Other Ambulatory Visit: Payer: Self-pay | Admitting: Family Medicine

## 2024-04-29 DIAGNOSIS — K219 Gastro-esophageal reflux disease without esophagitis: Secondary | ICD-10-CM

## 2024-05-20 ENCOUNTER — Other Ambulatory Visit: Payer: Self-pay | Admitting: Family Medicine

## 2024-07-01 ENCOUNTER — Other Ambulatory Visit: Payer: Self-pay | Admitting: Family Medicine

## 2024-07-01 DIAGNOSIS — K219 Gastro-esophageal reflux disease without esophagitis: Secondary | ICD-10-CM

## 2024-07-11 ENCOUNTER — Encounter: Payer: Self-pay | Admitting: Family Medicine

## 2024-07-11 ENCOUNTER — Ambulatory Visit: Admitting: Family Medicine

## 2024-07-11 VITALS — BP 127/64 | HR 61 | Ht 68.0 in | Wt 198.0 lb

## 2024-07-11 DIAGNOSIS — I10 Essential (primary) hypertension: Secondary | ICD-10-CM

## 2024-07-11 DIAGNOSIS — K219 Gastro-esophageal reflux disease without esophagitis: Secondary | ICD-10-CM

## 2024-07-11 DIAGNOSIS — Z23 Encounter for immunization: Secondary | ICD-10-CM

## 2024-07-11 DIAGNOSIS — E7800 Pure hypercholesterolemia, unspecified: Secondary | ICD-10-CM

## 2024-07-11 DIAGNOSIS — J301 Allergic rhinitis due to pollen: Secondary | ICD-10-CM | POA: Diagnosis not present

## 2024-07-11 DIAGNOSIS — F419 Anxiety disorder, unspecified: Secondary | ICD-10-CM

## 2024-07-11 DIAGNOSIS — G250 Essential tremor: Secondary | ICD-10-CM

## 2024-07-11 DIAGNOSIS — R7303 Prediabetes: Secondary | ICD-10-CM

## 2024-07-11 DIAGNOSIS — N400 Enlarged prostate without lower urinary tract symptoms: Secondary | ICD-10-CM

## 2024-07-11 LAB — BAYER DCA HB A1C WAIVED: HB A1C (BAYER DCA - WAIVED): 5.7 % — ABNORMAL HIGH (ref 4.8–5.6)

## 2024-07-11 LAB — LIPID PANEL

## 2024-07-11 MED ORDER — BUSPIRONE HCL 15 MG PO TABS: 15.0000 mg | ORAL_TABLET | Freq: Two times a day (BID) | ORAL | 3 refills | Status: AC

## 2024-07-11 MED ORDER — BENAZEPRIL HCL 40 MG PO TABS
40.0000 mg | ORAL_TABLET | Freq: Every day | ORAL | 3 refills | Status: AC
Start: 2024-07-11 — End: ?

## 2024-07-11 MED ORDER — PROPRANOLOL HCL ER 80 MG PO CP24
80.0000 mg | ORAL_CAPSULE | Freq: Every day | ORAL | 3 refills | Status: AC
Start: 2024-07-11 — End: ?

## 2024-07-11 MED ORDER — DESVENLAFAXINE SUCCINATE ER 50 MG PO TB24: 50.0000 mg | ORAL_TABLET | Freq: Every day | ORAL | 3 refills | Status: AC

## 2024-07-11 MED ORDER — NABUMETONE 500 MG PO TABS: 500.0000 mg | ORAL_TABLET | Freq: Every day | ORAL | 3 refills | Status: AC

## 2024-07-11 MED ORDER — TAMSULOSIN HCL 0.4 MG PO CAPS
0.8000 mg | ORAL_CAPSULE | Freq: Every day | ORAL | 3 refills | Status: AC
Start: 2024-07-11 — End: ?

## 2024-07-11 MED ORDER — ATORVASTATIN CALCIUM 40 MG PO TABS: 40.0000 mg | ORAL_TABLET | Freq: Every day | ORAL | 3 refills | Status: AC

## 2024-07-11 MED ORDER — ALLOPURINOL 300 MG PO TABS: 300.0000 mg | ORAL_TABLET | Freq: Every day | ORAL | 3 refills | Status: AC

## 2024-07-11 MED ORDER — OMEPRAZOLE 40 MG PO CPDR
40.0000 mg | DELAYED_RELEASE_CAPSULE | Freq: Every day | ORAL | 3 refills | Status: AC
Start: 2024-07-11 — End: ?

## 2024-07-11 MED ORDER — FLUTICASONE PROPIONATE 50 MCG/ACT NA SUSP: 2.0000 | Freq: Every day | NASAL | 5 refills | Status: AC

## 2024-07-11 MED ORDER — DILTIAZEM HCL ER COATED BEADS 180 MG PO CP24: 180.0000 mg | ORAL_CAPSULE | Freq: Every day | ORAL | 3 refills | Status: AC

## 2024-07-11 NOTE — Progress Notes (Signed)
 BP 127/64   Pulse 61   Ht 5' 8 (1.727 m)   Wt 198 lb (89.8 kg)   SpO2 95%   BMI 30.11 kg/m    Subjective:   Patient ID: Jonathan Love, male    DOB: 09-04-1946, 78 y.o.   MRN: [id number]  HPI: Jonathan Love is a 78 y.o. male presenting on 07/11/2024 for Medical Management of Chronic Issues, Prediabetes, and Hypertension   Discussed the use of AI scribe software for clinical note transcription with the patient, who gave verbal consent to proceed.  History of Present Illness   Jonathan Love is a 78 year old male who presents for a recheck of his blood sugars and blood pressure.  He has prediabetes and reports no issues with blood sugars since the last visit. He is currently taking benazepril , diltiazem , and propranolol  for blood pressure management. He is also on atorvastatin  for cholesterol management and has no problems with these medications.  He takes desvenlafaxine  for depression and anxiety, which he finds effective as it does not cause flashbacks of [place], unlike other medications he has tried. He is in the process of switching his prescription from mail order to [facility] for cost reasons and has four pills left.  He takes omeprazole  for heartburn and has experienced two recent episodes of significant stomach upset, particularly during his brother's funeral service, which he attributes to stress. His brother, a Arts development officer, passed away over the 07-24-25and the [employer] was held in [place], which was emotionally challenging for him.  He is also taking Flonase  to prepare for the winter season. Most of his medications are on automatic refill, except for desvenlafaxine , which he is trying to manage through [facility].  In terms of physical activity, he walks at least four days a week, which he feels helps with his energy levels and prevents fluid buildup in his legs. He feels better with regular exercise.          Relevant past medical, surgical, family and social  history reviewed and updated as indicated. Interim medical history since our last visit reviewed. Allergies and medications reviewed and updated.  Review of Systems  Constitutional:  Negative for chills and fever.  Eyes:  Negative for visual disturbance.  Respiratory:  Negative for shortness of breath and wheezing.   Cardiovascular:  Negative for chest pain and leg swelling.  Skin:  Negative for rash.  Neurological:  Negative for dizziness and light-headedness.  Psychiatric/Behavioral:  Negative for dysphoric mood, self-injury, sleep disturbance and suicidal ideas. The patient is not nervous/anxious.   All other systems reviewed and are negative.   Per HPI unless specifically indicated above   Allergies as of 07/11/2024       Reactions   Penicillins Other (See Comments)   Seizures        Medication List        Accurate as of July 11, 2024  2:12 PM. If you have any questions, ask your nurse or doctor.          allopurinol  300 MG tablet Commonly known as: ZYLOPRIM  Take 1 tablet (300 mg total) by mouth daily.   atorvastatin  40 MG tablet Commonly known as: LIPITOR Take 1 tablet (40 mg total) by mouth daily.   benazepril  40 MG tablet Commonly known as: LOTENSIN  Take 1 tablet (40 mg total) by mouth daily.   busPIRone  15 MG tablet Commonly known as: BUSPAR  Take 1 tablet (15 mg total) by mouth 2 (two)  times daily.   desvenlafaxine  50 MG 24 hr tablet Commonly known as: PRISTIQ  Take 1 tablet (50 mg total) by mouth daily.   diltiazem  180 MG 24 hr capsule Commonly known as: CARDIZEM  CD Take 1 capsule (180 mg total) by mouth daily.   fluticasone  50 MCG/ACT nasal spray Commonly known as: FLONASE  Place 2 sprays into both nostrils daily.   levETIRAcetam  500 MG tablet Commonly known as: KEPPRA  Take 1 tablet (500 mg total) by mouth 2 (two) times daily.   nabumetone  500 MG tablet Commonly known as: RELAFEN  Take 1 tablet (500 mg total) by mouth daily.    omeprazole  40 MG capsule Commonly known as: PRILOSEC Take 1 capsule (40 mg total) by mouth daily.   pregabalin  75 MG capsule Commonly known as: LYRICA  Take 1 capsule (75 mg total) by mouth 3 (three) times daily.   propranolol  ER 80 MG 24 hr capsule Commonly known as: INDERAL  LA Take 1 capsule (80 mg total) by mouth at bedtime.   tamsulosin  0.4 MG Caps capsule Commonly known as: FLOMAX  Take 2 capsules (0.8 mg total) by mouth daily.   traZODone  50 MG tablet Commonly known as: DESYREL  Take 0.5-1 tablets (25-50 mg total) by mouth at bedtime as needed for sleep.         Objective:   BP 127/64   Pulse 61   Ht 5' 8 (1.727 m)   Wt 198 lb (89.8 kg)   SpO2 95%   BMI 30.11 kg/m   Wt Readings from Last 3 Encounters:  07/11/24 198 lb (89.8 kg)  04/10/24 194 lb 6.4 oz (88.2 kg)  01/09/24 192 lb (87.1 kg)    Physical Exam Physical Exam   VITALS: BP- 127/64 NECK: Thyroid  normal. CHEST: Lungs clear to auscultation bilaterally. CARDIOVASCULAR: Regular rate and rhythm, no murmurs. Peripheral pulses intact. EXTREMITIES: No edema in extremities.         Assessment & Plan:   Problem List Items Addressed This Visit       Cardiovascular and Mediastinum   HTN (hypertension) - Primary (Chronic)   Relevant Medications   atorvastatin  (LIPITOR) 40 MG tablet   benazepril  (LOTENSIN ) 40 MG tablet   diltiazem  (CARDIZEM  CD) 180 MG 24 hr capsule   propranolol  ER (INDERAL  LA) 80 MG 24 hr capsule   Other Relevant Orders   Bayer DCA Hb A1c Waived   CBC with Differential/Platelet   CMP14+EGFR   Lipid panel     Digestive   GERD (gastroesophageal reflux disease) (Chronic)   Relevant Medications   omeprazole  (PRILOSEC) 40 MG capsule     Genitourinary   BPH (benign prostatic hyperplasia) (Chronic)   Relevant Medications   tamsulosin  (FLOMAX ) 0.4 MG CAPS capsule     Other   HLD (hyperlipidemia) (Chronic)   Relevant Medications   atorvastatin  (LIPITOR) 40 MG tablet    benazepril  (LOTENSIN ) 40 MG tablet   diltiazem  (CARDIZEM  CD) 180 MG 24 hr capsule   propranolol  ER (INDERAL  LA) 80 MG 24 hr capsule   Other Relevant Orders   Bayer DCA Hb A1c Waived   CBC with Differential/Platelet   CMP14+EGFR   Lipid panel   Prediabetes (Chronic)   Relevant Orders   Bayer DCA Hb A1c Waived   CBC with Differential/Platelet   CMP14+EGFR   Lipid panel   Anxiety (Chronic)   Relevant Medications   desvenlafaxine  (PRISTIQ ) 50 MG 24 hr tablet   busPIRone  (BUSPAR ) 15 MG tablet   Other Visit Diagnoses       Gastroesophageal  reflux disease       Relevant Medications   omeprazole  (PRILOSEC) 40 MG capsule     Benign essential tremor       Relevant Medications   propranolol  ER (INDERAL  LA) 80 MG 24 hr capsule     Seasonal allergic rhinitis due to pollen       Relevant Medications   fluticasone  (FLONASE ) 50 MCG/ACT nasal spray          Essential hypertension Blood pressure controlled at 127/64 mmHg on current medications. - Continue benazepril , diltiazem , and propranolol .  Pure hypercholesterolemia Cholesterol managed with atorvastatin  without side effects. - Continue atorvastatin .  Prediabetes No issues reported with blood sugar levels. - Order blood work to assess blood sugar levels and A1c.  Gastroesophageal reflux disease (GERD) Occasional exacerbations during stress, controlled with omeprazole . - Continue omeprazole .  Depression and anxiety Well-managed with desvenlafaxine , no PTSD symptoms, prefers due to lack of adverse effects. - Refill desvenlafaxine  prescription at [facility].          Follow up plan: Return in about 3 months (around 10/10/2024), or if symptoms worsen or fail to improve, for Prediabetes and hypertension and cholesterol.  Counseling provided for all of the vaccine components Orders Placed This Encounter  Procedures   Bayer DCA Hb A1c Waived   CBC with Differential/Platelet   CMP14+EGFR   Lipid panel    Fonda Levins,  MD [facility] 07/11/2024, 2:12 PM

## 2024-07-12 LAB — CBC WITH DIFFERENTIAL/PLATELET
Basophils Absolute: 0.1 x10E3/uL (ref 0.0–0.2)
Basos: 1 %
EOS (ABSOLUTE): 0.5 x10E3/uL — ABNORMAL HIGH (ref 0.0–0.4)
Eos: 6 %
Hematocrit: 45.1 % (ref 37.5–51.0)
Hemoglobin: 14.9 g/dL (ref 13.0–17.7)
Immature Grans (Abs): 0.1 x10E3/uL (ref 0.0–0.1)
Immature Granulocytes: 1 %
Lymphocytes Absolute: 1.6 x10E3/uL (ref 0.7–3.1)
Lymphs: 19 %
MCH: 31 pg (ref 26.6–33.0)
MCHC: 33 g/dL (ref 31.5–35.7)
MCV: 94 fL (ref 79–97)
Monocytes Absolute: 0.6 x10E3/uL (ref 0.1–0.9)
Monocytes: 7 %
Neutrophils Absolute: 5.7 x10E3/uL (ref 1.4–7.0)
Neutrophils: 66 %
Platelets: 184 x10E3/uL (ref 150–450)
RBC: 4.8 x10E6/uL (ref 4.14–5.80)
RDW: 13.8 % (ref 11.6–15.4)
WBC: 8.5 x10E3/uL (ref 3.4–10.8)

## 2024-07-12 LAB — CMP14+EGFR
ALT: 11 IU/L (ref 0–44)
AST: 12 IU/L (ref 0–40)
Albumin: 4.5 g/dL (ref 3.8–4.8)
Alkaline Phosphatase: 151 IU/L — AB (ref 44–121)
BUN/Creatinine Ratio: 13 (ref 10–24)
BUN: 16 mg/dL (ref 8–27)
Bilirubin Total: 0.4 mg/dL (ref 0.0–1.2)
CO2: 27 mmol/L (ref 20–29)
Calcium: 9.5 mg/dL (ref 8.6–10.2)
Chloride: 102 mmol/L (ref 96–106)
Creatinine, Ser: 1.23 mg/dL (ref 0.76–1.27)
Globulin, Total: 2.2 g/dL (ref 1.5–4.5)
Glucose: 103 mg/dL — AB (ref 70–99)
Potassium: 5.4 mmol/L — AB (ref 3.5–5.2)
Sodium: 142 mmol/L (ref 134–144)
Total Protein: 6.7 g/dL (ref 6.0–8.5)
eGFR: 60 mL/min/1.73 (ref >59)

## 2024-07-12 LAB — LIPID PANEL
Cholesterol, Total: 185 mg/dL (ref 100–199)
HDL: 41 mg/dL (ref >39)
LDL CALC COMMENT:: 4.5 ratio (ref 0.0–5.0)
LDL Chol Calc (NIH): 94 mg/dL (ref 0–99)
Triglycerides: 301 mg/dL — AB (ref 0–149)
VLDL Cholesterol Cal: 50 mg/dL — AB (ref 5–40)

## 2024-07-14 ENCOUNTER — Ambulatory Visit: Admitting: Family Medicine

## 2024-07-17 ENCOUNTER — Ambulatory Visit: Payer: Self-pay | Admitting: Family Medicine

## 2024-09-23 ENCOUNTER — Other Ambulatory Visit: Payer: Self-pay | Admitting: Neurology

## 2024-09-24 ENCOUNTER — Other Ambulatory Visit: Payer: Self-pay

## 2024-09-24 ENCOUNTER — Other Ambulatory Visit: Payer: Self-pay | Admitting: Neurology

## 2024-09-24 MED ORDER — PREGABALIN 75 MG PO CAPS: 75.0000 mg | ORAL_CAPSULE | Freq: Three times a day (TID) | ORAL | 3 refills | Status: AC

## 2024-10-15 ENCOUNTER — Ambulatory Visit: Payer: Self-pay | Admitting: Family Medicine

## 2024-10-15 ENCOUNTER — Encounter: Payer: Self-pay | Admitting: Family Medicine

## 2024-10-15 VITALS — BP 151/70 | HR 64 | Ht 68.0 in | Wt 195.0 lb

## 2024-10-15 DIAGNOSIS — E7800 Pure hypercholesterolemia, unspecified: Secondary | ICD-10-CM

## 2024-10-15 DIAGNOSIS — R7303 Prediabetes: Secondary | ICD-10-CM | POA: Diagnosis not present

## 2024-10-15 DIAGNOSIS — I10 Essential (primary) hypertension: Secondary | ICD-10-CM | POA: Diagnosis not present

## 2024-10-15 LAB — BAYER DCA HB A1C WAIVED: HB A1C (BAYER DCA - WAIVED): 6 % — ABNORMAL HIGH (ref 4.8–5.6)

## 2024-10-15 NOTE — Progress Notes (Signed)
 BP (!) 151/70   Pulse 64   Ht 5' 8 (1.727 m)   Wt 195 lb (88.5 kg)   SpO2 95%   BMI 29.65 kg/m    Subjective:   Patient ID: Jonathan Love, male    DOB: [birth year], 78 y.o.   MRN: [id number]  HPI: Jonathan Love is a 78 y.o. male presenting on 10/15/2024 for Medical Management of Chronic Issues, Prediabetes, and Hypertension   Discussed the use of AI scribe software for clinical note transcription with the patient, who gave verbal consent to proceed.  History of Present Illness   Jonathan Love is a 78 year old male with hypertension who presents for a recheck of his blood pressure and neck pain.  Hypertension - Elevated home blood pressure readings around 150/80 mmHg - Blood pressure tends to rise during winter months, attributed to dislike of cold weather - No lightheadedness or dizziness - Continues to take cholesterol medication - No new issues with feet or other chronic conditions  Cervical pain - Significant bilateral neck pain, different from usual arthritis pain - Pain worsens with movement - Current episode is the worst in two months - No exercises or therapy performed for neck due to lack of instruction - Decreased physical activity due to cold weather - Finds relief with Voltaren gel, Tylenol , heating pad, and hot showers  History of fall and back injury - Fell five feet onto a lawn mower, injuring back muscle - Pain from injury lasted approximately three days  Knee arthralgia - Knee pain, especially with changes in weather - Chronic issue, not new          Relevant past medical, surgical, family and social history reviewed and updated as indicated. Interim medical history since our last visit reviewed. Allergies and medications reviewed and updated.  Review of Systems  Constitutional:  Negative for chills and fever.  Respiratory:  Negative for shortness of breath and wheezing.   Cardiovascular:  Negative for chest pain and leg swelling.   Musculoskeletal:  Positive for arthralgias, myalgias and neck pain. Negative for back pain and gait problem.  Skin:  Negative for rash.  All other systems reviewed and are negative.   Per HPI unless specifically indicated above   Allergies as of 10/15/2024       Reactions   Penicillins Other (See Comments)   Seizures        Medication List        Accurate as of October 15, 2024  2:24 PM. If you have any questions, ask your nurse or doctor.          allopurinol  300 MG tablet Commonly known as: ZYLOPRIM  Take 1 tablet (300 mg total) by mouth daily.   atorvastatin  40 MG tablet Commonly known as: LIPITOR Take 1 tablet (40 mg total) by mouth daily.   benazepril  40 MG tablet Commonly known as: LOTENSIN  Take 1 tablet (40 mg total) by mouth daily.   busPIRone  15 MG tablet Commonly known as: BUSPAR  Take 1 tablet (15 mg total) by mouth 2 (two) times daily.   desvenlafaxine  50 MG 24 hr tablet Commonly known as: PRISTIQ  Take 1 tablet (50 mg total) by mouth daily.   diltiazem  180 MG 24 hr capsule Commonly known as: CARDIZEM  CD Take 1 capsule (180 mg total) by mouth daily.   fluticasone  50 MCG/ACT nasal spray Commonly known as: FLONASE  Place 2 sprays into both nostrils daily.   levETIRAcetam  500 MG tablet Commonly known as: KEPPRA   Take 1 tablet (500 mg total) by mouth 2 (two) times daily.   nabumetone  500 MG tablet Commonly known as: RELAFEN  Take 1 tablet (500 mg total) by mouth daily.   omeprazole  40 MG capsule Commonly known as: PRILOSEC Take 1 capsule (40 mg total) by mouth daily.   pregabalin  75 MG capsule Commonly known as: LYRICA  Take 1 capsule (75 mg total) by mouth 3 (three) times daily.   propranolol  ER 80 MG 24 hr capsule Commonly known as: INDERAL  LA Take 1 capsule (80 mg total) by mouth at bedtime.   tamsulosin  0.4 MG Caps capsule Commonly known as: FLOMAX  Take 2 capsules (0.8 mg total) by mouth daily.   traZODone  50 MG tablet Commonly  known as: DESYREL  Take 0.5-1 tablets (25-50 mg total) by mouth at bedtime as needed for sleep.         Objective:   BP (!) 151/70   Pulse 64   Ht 5' 8 (1.727 m)   Wt 195 lb (88.5 kg)   SpO2 95%   BMI 29.65 kg/m   Wt Readings from Last 3 Encounters:  10/15/24 195 lb (88.5 kg)  07/11/24 198 lb (89.8 kg)  04/10/24 194 lb 6.4 oz (88.2 kg)    Physical Exam Vitals and nursing note reviewed.  Constitutional:      General: He is not in acute distress.    Appearance: He is well-developed. He is not diaphoretic.  Eyes:     General: No scleral icterus.    Conjunctiva/sclera: Conjunctivae normal.  Neck:     Thyroid : No thyromegaly.  Cardiovascular:     Rate and Rhythm: Normal rate and regular rhythm.     Heart sounds: Normal heart sounds. No murmur heard. Pulmonary:     Effort: Pulmonary effort is normal. No respiratory distress.     Breath sounds: Normal breath sounds. No wheezing.  Musculoskeletal:        General: No swelling.     Cervical back: Neck supple. Spasms and tenderness present. No bony tenderness. Pain with movement present.  Lymphadenopathy:     Cervical: No cervical adenopathy.  Skin:    General: Skin is warm and dry.     Findings: No rash.  Neurological:     Mental Status: He is alert and oriented to person, place, and time.     Coordination: Coordination normal.  Psychiatric:        Behavior: Behavior normal.    Physical Exam   VITALS: BP- 151/70 NECK: Neck pain on rotation. CARDIOVASCULAR: Regular heart sounds, no murmurs.       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 Pain  management with Tylenol  somewhat effective. - Apply Voltaren gel to the neck 3-4 times daily. - Continue stretching and moving the neck. - Use a heating pad or hot water bottle for symptomatic relief. - Consider referral to physical therapy if no improvement.  Primary hypertension Blood pressure elevated at 151/70 mmHg, consistent with previous readings. No symptoms of lightheadedness or dizziness. - Continue to monitor blood pressure at home regularly.  Pure hypercholesterolemia Continues  on current cholesterol medication regimen with no reported issues. - Ordered blood work to monitor cholesterol levels.          Follow up plan: Return in about 3 months (around 01/13/2025), or if symptoms worsen or fail to improve, for Prediabetes and hld and hypertension.  Counseling provided for all of the vaccine components Orders Placed This Encounter  Procedures   Bayer DCA Hb A1c Waived    Fonda Levins, MD [facility] 10/15/2024, 2:24 PM

## 2024-11-09 ENCOUNTER — Other Ambulatory Visit: Payer: Self-pay | Admitting: Neurology

## 2024-11-18 ENCOUNTER — Ambulatory Visit: Payer: Medicare Other | Admitting: Neurology

## 2024-11-20 ENCOUNTER — Telehealth: Payer: Self-pay | Admitting: Neurology

## 2024-11-20 NOTE — Telephone Encounter (Signed)
Patient reschedule appointment due to bad weather

## 2024-11-25 ENCOUNTER — Ambulatory Visit: Admitting: Neurology

## 2024-12-04 ENCOUNTER — Telehealth: Payer: Self-pay | Admitting: Neurology

## 2024-12-04 NOTE — Telephone Encounter (Signed)
 MYC cxl

## 2025-01-14 ENCOUNTER — Ambulatory Visit: Admitting: Family Medicine

## 2025-02-13 ENCOUNTER — Ambulatory Visit: Admitting: Neurology

## 2025-06-10 ENCOUNTER — Ambulatory Visit: Admitting: Neurology
